# Patient Record
Sex: Male | Born: 1960 | Race: White | Hispanic: No | Marital: Married | Smoking: Never smoker
Health system: Southern US, Academic
[De-identification: ages and names within clinical notes are randomized; demographics above are authoritative.]

## PROBLEM LIST (undated history)

## (undated) ENCOUNTER — Encounter (HOSPITAL_COMMUNITY): Admission: RE | Payer: Self-pay | Source: Ambulatory Visit

## (undated) DIAGNOSIS — E785 Hyperlipidemia, unspecified: Secondary | ICD-10-CM

## (undated) DIAGNOSIS — E079 Disorder of thyroid, unspecified: Secondary | ICD-10-CM

## (undated) DIAGNOSIS — G40909 Epilepsy, unspecified, not intractable, without status epilepticus: Secondary | ICD-10-CM

## (undated) DIAGNOSIS — N400 Enlarged prostate without lower urinary tract symptoms: Secondary | ICD-10-CM

## (undated) DIAGNOSIS — Z952 Presence of prosthetic heart valve: Secondary | ICD-10-CM

## (undated) DIAGNOSIS — I10 Essential (primary) hypertension: Secondary | ICD-10-CM

## (undated) DIAGNOSIS — K5730 Diverticulosis of large intestine without perforation or abscess without bleeding: Secondary | ICD-10-CM

## (undated) DIAGNOSIS — N402 Nodular prostate without lower urinary tract symptoms: Secondary | ICD-10-CM

## (undated) DIAGNOSIS — M129 Arthropathy, unspecified: Secondary | ICD-10-CM

## (undated) DIAGNOSIS — F32A Depression, unspecified: Secondary | ICD-10-CM

## (undated) DIAGNOSIS — I82409 Acute embolism and thrombosis of unspecified deep veins of unspecified lower extremity: Secondary | ICD-10-CM

## (undated) DIAGNOSIS — Q631 Lobulated, fused and horseshoe kidney: Secondary | ICD-10-CM

## (undated) DIAGNOSIS — K219 Gastro-esophageal reflux disease without esophagitis: Secondary | ICD-10-CM

## (undated) DIAGNOSIS — I2699 Other pulmonary embolism without acute cor pulmonale: Secondary | ICD-10-CM

## (undated) DIAGNOSIS — Z973 Presence of spectacles and contact lenses: Secondary | ICD-10-CM

## (undated) DIAGNOSIS — F419 Anxiety disorder, unspecified: Secondary | ICD-10-CM

## (undated) DIAGNOSIS — E291 Testicular hypofunction: Secondary | ICD-10-CM

## (undated) DIAGNOSIS — A0472 Enterocolitis due to Clostridium difficile, not specified as recurrent: Secondary | ICD-10-CM

## (undated) DIAGNOSIS — K2289 Other specified disease of esophagus: Secondary | ICD-10-CM

## (undated) DIAGNOSIS — K228 Other specified diseases of esophagus: Secondary | ICD-10-CM

## (undated) DIAGNOSIS — E039 Hypothyroidism, unspecified: Secondary | ICD-10-CM

## (undated) DIAGNOSIS — F329 Major depressive disorder, single episode, unspecified: Secondary | ICD-10-CM

## (undated) DIAGNOSIS — M47812 Spondylosis without myelopathy or radiculopathy, cervical region: Secondary | ICD-10-CM

## (undated) DIAGNOSIS — K5790 Diverticulosis of intestine, part unspecified, without perforation or abscess without bleeding: Secondary | ICD-10-CM

## (undated) HISTORY — PX: INCISIONAL HERNIA REPAIR: SHX193

## (undated) HISTORY — DX: Gastro-esophageal reflux disease without esophagitis: K21.9

## (undated) HISTORY — PX: VENTRAL HERNIA REPAIR: SHX424

## (undated) HISTORY — DX: Presence of prosthetic heart valve: Z95.2

## (undated) HISTORY — DX: Enterocolitis due to Clostridium difficile, not specified as recurrent: A04.72

## (undated) HISTORY — DX: Anxiety disorder, unspecified: F41.9

## (undated) HISTORY — DX: Hyperlipidemia, unspecified: E78.5

## (undated) HISTORY — PX: HX TURP: SHX73

## (undated) HISTORY — DX: Nodular prostate without lower urinary tract symptoms: N40.2

## (undated) HISTORY — DX: Depression, unspecified: F32.A

## (undated) HISTORY — DX: Essential (primary) hypertension: I10

## (undated) HISTORY — DX: Testicular hypofunction: E29.1

## (undated) HISTORY — DX: Other pulmonary embolism without acute cor pulmonale: I26.99

## (undated) HISTORY — DX: Other pulmonary embolism without acute cor pulmonale (CMS HCC): I26.99

## (undated) HISTORY — DX: Other specified disease of esophagus: K22.89

## (undated) HISTORY — DX: Hypothyroidism, unspecified: E03.9

## (undated) HISTORY — PX: HX HERNIA REPAIR: SHX51

## (undated) HISTORY — DX: Acute embolism and thrombosis of unspecified deep veins of unspecified lower extremity (CMS HCC): I82.409

## (undated) HISTORY — DX: Diverticulosis of intestine, part unspecified, without perforation or abscess without bleeding: K57.90

## (undated) HISTORY — DX: Lobulated, fused and horseshoe kidney: Q63.1

## (undated) HISTORY — DX: Benign prostatic hyperplasia without lower urinary tract symptoms: N40.0

## (undated) HISTORY — DX: Epilepsy, unspecified, not intractable, without status epilepticus: G40.909

## (undated) HISTORY — DX: Acute embolism and thrombosis of unspecified deep veins of unspecified lower extremity: I82.409

## (undated) HISTORY — PX: COLONOSCOPY: WVUENDOPRO10

## (undated) HISTORY — DX: Epilepsy, unspecified, not intractable, without status epilepticus (CMS HCC): G40.909

## (undated) HISTORY — DX: Diverticulosis of large intestine without perforation or abscess without bleeding: K57.30

## (undated) HISTORY — DX: Disorder of thyroid, unspecified: E07.9

## (undated) HISTORY — PX: HERNIA REPAIR: SHX51

## (undated) HISTORY — PX: BOWEL RESECTION: SHX1257

## (undated) HISTORY — PX: CHOLECYSTECTOMY: SHX55

## (undated) HISTORY — PX: VALVE REPLACEMENT: SUR13

## (undated) SURGERY — RIGHT AND LEFT HEART CATH
Anesthesia: Moderate Sedation | Laterality: Bilateral

---
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
## 2003-05-02 ENCOUNTER — Encounter: Admission: RE | Admit: 2003-05-02 | Discharge: 2003-05-02 | Payer: Self-pay | Admitting: Gastroenterology

## 2003-10-20 ENCOUNTER — Encounter: Admission: RE | Admit: 2003-10-20 | Discharge: 2003-10-20 | Payer: Self-pay | Admitting: Gastroenterology

## 2003-11-06 ENCOUNTER — Encounter (INDEPENDENT_AMBULATORY_CARE_PROVIDER_SITE_OTHER): Payer: Self-pay | Admitting: *Deleted

## 2003-11-06 ENCOUNTER — Ambulatory Visit (HOSPITAL_COMMUNITY): Admission: RE | Admit: 2003-11-06 | Discharge: 2003-11-06 | Payer: Self-pay | Admitting: Gastroenterology

## 2003-11-27 ENCOUNTER — Encounter: Admission: RE | Admit: 2003-11-27 | Discharge: 2003-11-27 | Payer: Self-pay | Admitting: Gastroenterology

## 2004-01-23 ENCOUNTER — Observation Stay (HOSPITAL_COMMUNITY): Admission: RE | Admit: 2004-01-23 | Discharge: 2004-01-24 | Payer: Self-pay | Admitting: General Surgery

## 2004-03-29 ENCOUNTER — Encounter: Admission: RE | Admit: 2004-03-29 | Discharge: 2004-03-29 | Payer: Self-pay | Admitting: General Surgery

## 2004-11-24 ENCOUNTER — Encounter: Admission: RE | Admit: 2004-11-24 | Discharge: 2004-11-24 | Payer: Self-pay | Admitting: Family Medicine

## 2005-03-21 ENCOUNTER — Encounter: Admission: RE | Admit: 2005-03-21 | Discharge: 2005-03-21 | Payer: Self-pay | Admitting: Family Medicine

## 2005-10-31 ENCOUNTER — Encounter: Admission: RE | Admit: 2005-10-31 | Discharge: 2005-10-31 | Payer: Self-pay

## 2006-03-12 ENCOUNTER — Emergency Department (HOSPITAL_COMMUNITY): Admission: EM | Admit: 2006-03-12 | Discharge: 2006-03-12 | Payer: Self-pay | Admitting: Emergency Medicine

## 2006-06-29 IMAGING — RF DG UGI W/ SMALL BOWEL HIGH DENSITY
19 of 24 series · 19 of 24 positions shown · non-contrast
Comparison: none

CLINICAL DATA: Abdominal pain, nausea, vomiting.  
 HIGH DENSITY UPPER GI SERIES AND SMALL BOWEL FOLLOW THROUGH: 
 Comparison to CT abdomen and pelvis 05/02/03 [HOSPITAL].  
 The preliminary scout AP supine abdominal film demonstrates a normal bowel gas pattern.  No abnormal calcifications were identified.  
 The patient swallowed the thick and thin barium liquid without difficulty.  Esophageal peristalsis is normal.  No esophageal strictures or masses were identified.  A small sliding hiatal hernia is present with a wide open Schatzki?s ring.  Free gastroesophageal reflux did occur during the examination.  There is no radiographic evidence of esophagitis. 
 The stomach is otherwise normal in appearance and empties normally.  The duodenal bulb and duodenal sweep are unremarkable.  There is no evidence of active peptic ulcer disease. 
 Transit time through the small bowel was normal, with barium reaching the colon in approximately one hour and 15 minutes.  The mucosal pattern of the small bowel is normal throughout.  Fluoroscopic evaluation during manual compression revealed no intrinsic abnormalities of the small bowel.  The terminal ileum has a normal appearance.  
 Of note, the small bowel is displaced from the mid and lower abdomen due to the patient?s cross-fused renal ectopia noted on the prior CT examination.

[Series 1: run · 1 of 9 slices shown (1 of 19)]
[im 1/9]
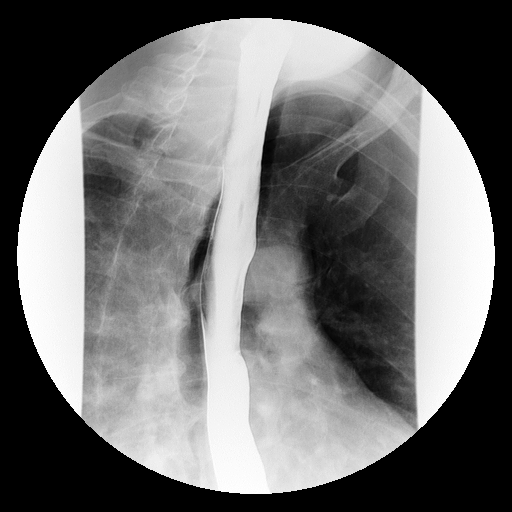

[Series 2: run · 1 of 1 slices shown (2 of 19)]
[im 1/1]
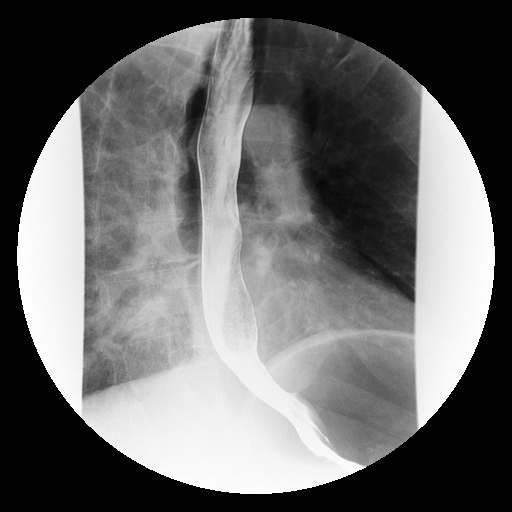

[Series 4: run · 1 of 1 slices shown (3 of 19)]
[im 1/1]
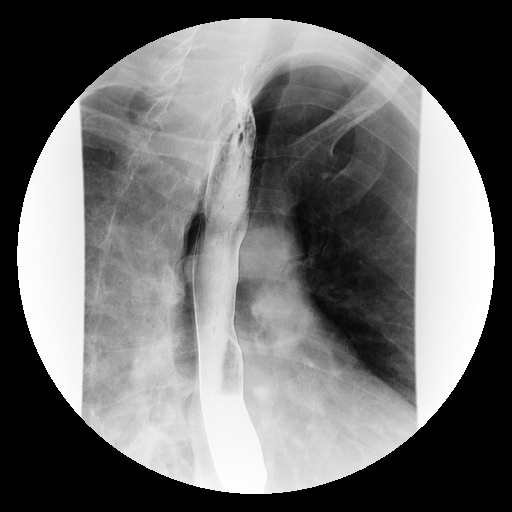

[Series 5: run · 1 of 1 slices shown (4 of 19)]
[im 1/1]
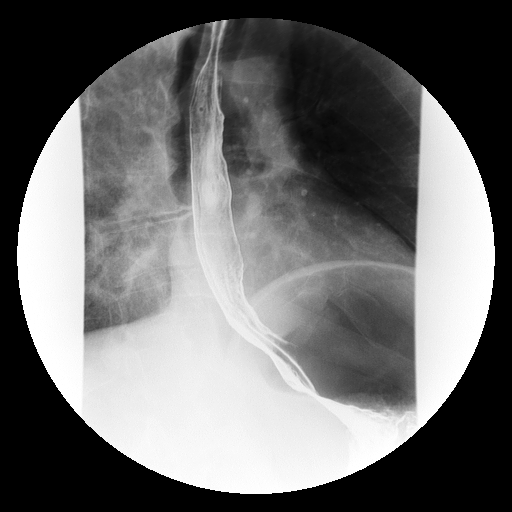

[Series 6: run · 1 of 1 slices shown (5 of 19)]
[im 1/1]
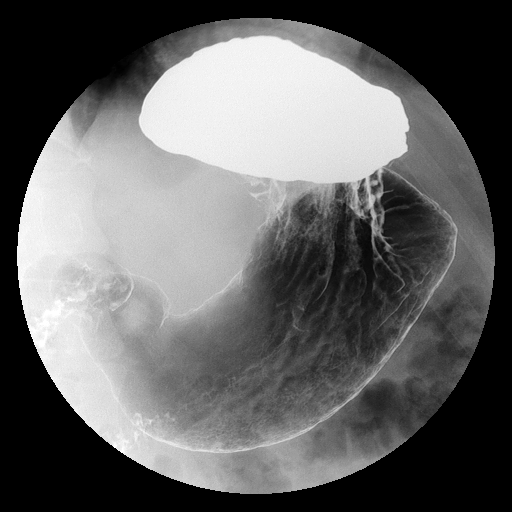

[Series 7: run · 1 of 1 slices shown (6 of 19)]
[im 1/1]
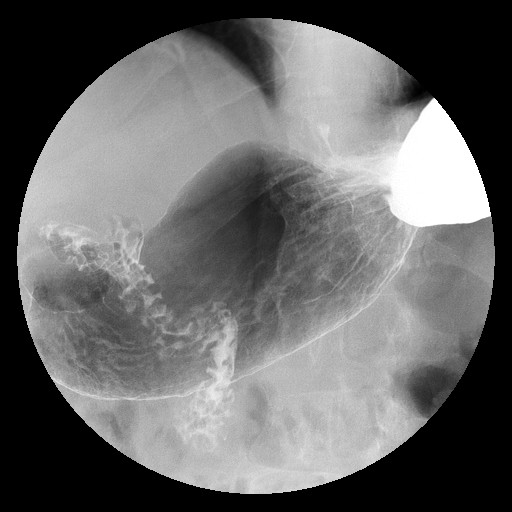

[Series 9: run · 1 of 1 slices shown (7 of 19)]
[im 1/1]
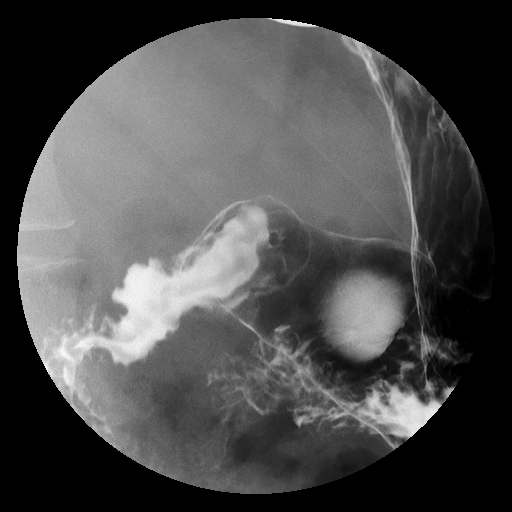

[Series 10: run · 1 of 1 slices shown (8 of 19)]
[im 1/1]
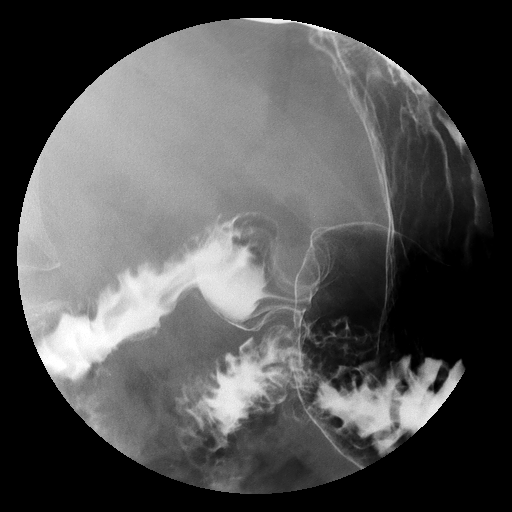

[Series 11: run · 1 of 1 slices shown (9 of 19)]
[im 1/1]
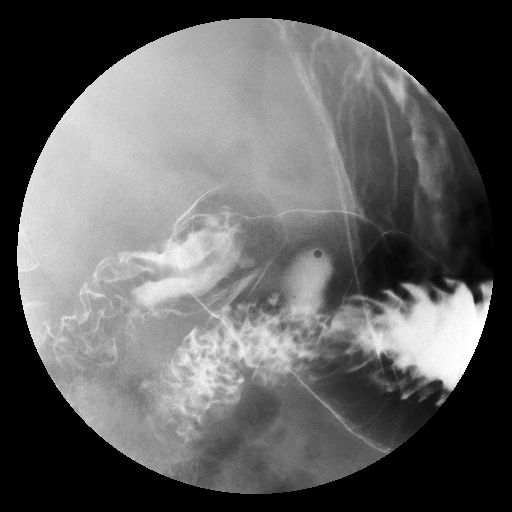

[Series 13: run · 1 of 1 slices shown (10 of 19)]
[im 1/1]
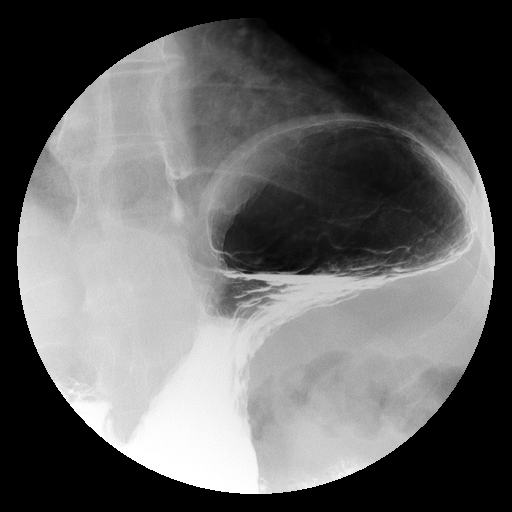

[Series 14: run · 1 of 14 slices shown (11 of 19)]
[im 1/14]
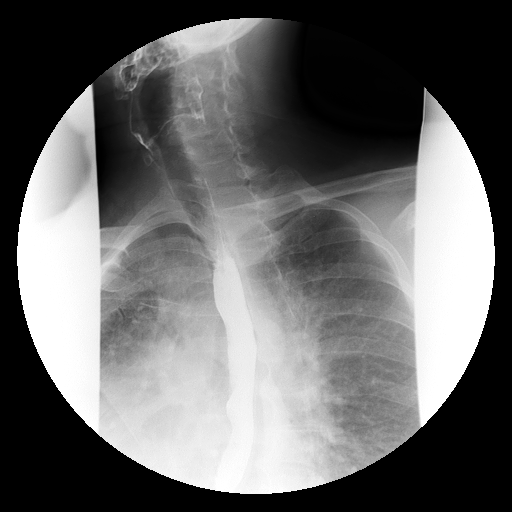

[Series 15: run · 1 of 17 slices shown (12 of 19)]
[im 1/17]
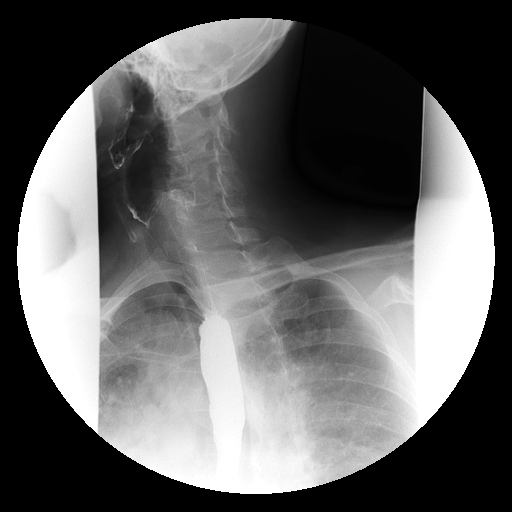

[Series 16: run · 1 of 1 slices shown (13 of 19)]
[im 1/1]
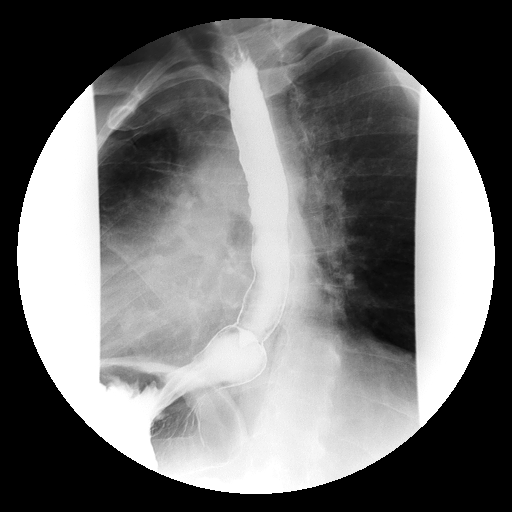

[Series 18: run · 1 of 1 slices shown (14 of 19)]
[im 1/1]
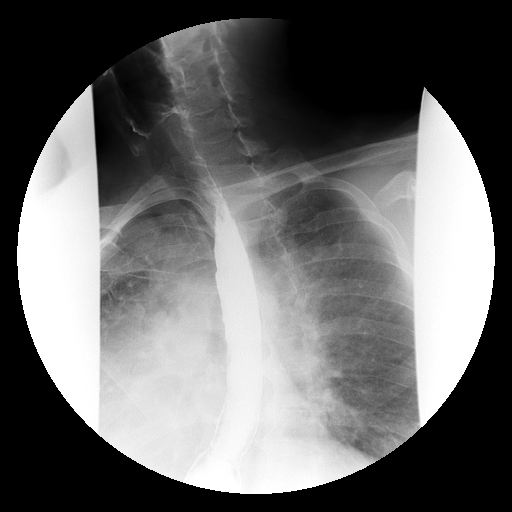

[Series 19: run · 1 of 1 slices shown (15 of 19)]
[im 1/1]
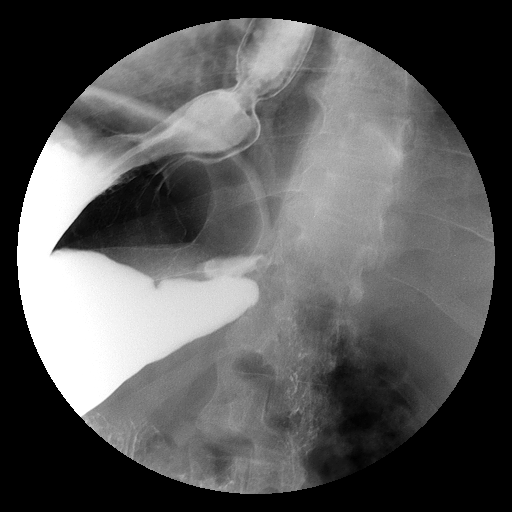

[Series 20: run · 1 of 1 slices shown (16 of 19)]
[im 1/1]
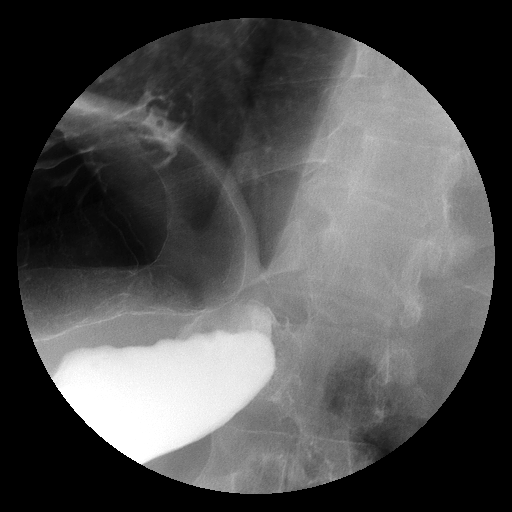

[Series 21: run · 1 of 1 slices shown (17 of 19)]
[im 1/1]
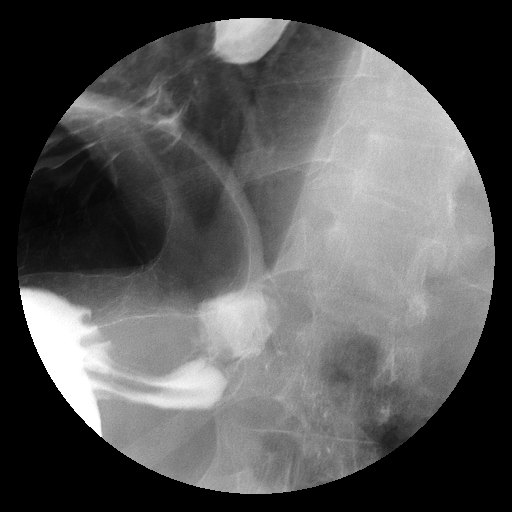

[Series 23: run · 1 of 1 slices shown (18 of 19)]
[im 1/1]
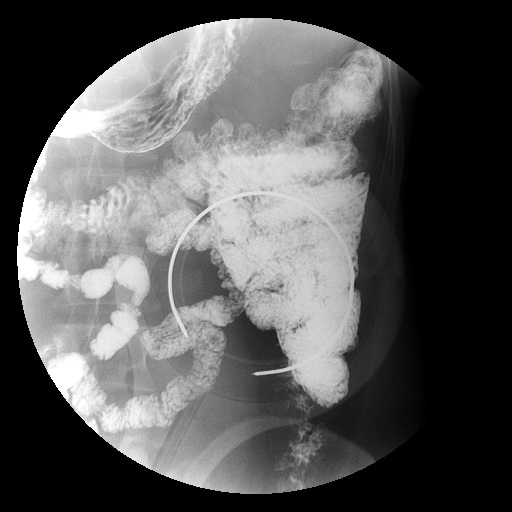

[Series 24: run · 1 of 1 slices shown (19 of 19)]
[im 1/1]
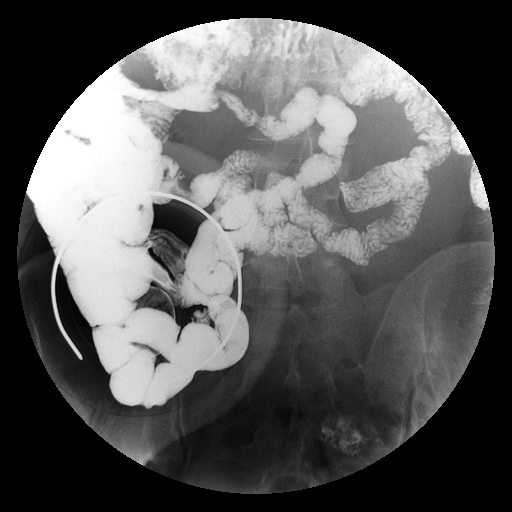

[19 of 24 positions shown; findings below may reference images not displayed]

IMPRESSION: 1.  Small sliding hiatal hernia with free gastroesophageal reflux.  No radiographic evidence of esophagitis.  No esophageal stricture. 
 2.  Normal upper GI series otherwise. 
 3.  Normal small bowel follow through.

## 2007-02-02 ENCOUNTER — Emergency Department (HOSPITAL_COMMUNITY): Admission: EM | Admit: 2007-02-02 | Discharge: 2007-02-02 | Payer: Self-pay | Admitting: Emergency Medicine

## 2007-03-08 HISTORY — PX: HX AORTIC VALVE REPLACEMENT: SHX41

## 2007-11-07 ENCOUNTER — Emergency Department (HOSPITAL_COMMUNITY): Admission: EM | Admit: 2007-11-07 | Discharge: 2007-11-07 | Payer: Self-pay | Admitting: Emergency Medicine

## 2008-03-28 ENCOUNTER — Encounter: Admission: RE | Admit: 2008-03-28 | Discharge: 2008-03-28 | Payer: Self-pay | Admitting: Gastroenterology

## 2008-08-21 ENCOUNTER — Ambulatory Visit (HOSPITAL_COMMUNITY): Admission: RE | Admit: 2008-08-21 | Discharge: 2008-08-21 | Payer: Self-pay | Admitting: Cardiology

## 2008-09-02 ENCOUNTER — Ambulatory Visit: Payer: Self-pay | Admitting: Thoracic Surgery (Cardiothoracic Vascular Surgery)

## 2008-12-19 ENCOUNTER — Encounter: Admission: RE | Admit: 2008-12-19 | Discharge: 2008-12-19 | Payer: Self-pay | Admitting: Family Medicine

## 2009-02-05 ENCOUNTER — Ambulatory Visit: Payer: Self-pay | Admitting: Thoracic Surgery (Cardiothoracic Vascular Surgery)

## 2009-02-13 ENCOUNTER — Inpatient Hospital Stay (HOSPITAL_BASED_OUTPATIENT_CLINIC_OR_DEPARTMENT_OTHER): Admission: RE | Admit: 2009-02-13 | Discharge: 2009-02-13 | Payer: Self-pay | Admitting: Cardiology

## 2009-03-09 ENCOUNTER — Encounter: Payer: Self-pay | Admitting: Thoracic Surgery (Cardiothoracic Vascular Surgery)

## 2009-03-09 ENCOUNTER — Ambulatory Visit (HOSPITAL_COMMUNITY)
Admission: RE | Admit: 2009-03-09 | Discharge: 2009-03-09 | Payer: Self-pay | Admitting: Thoracic Surgery (Cardiothoracic Vascular Surgery)

## 2009-03-09 ENCOUNTER — Ambulatory Visit: Payer: Self-pay | Admitting: Vascular Surgery

## 2009-03-20 ENCOUNTER — Ambulatory Visit: Payer: Self-pay | Admitting: Thoracic Surgery (Cardiothoracic Vascular Surgery)

## 2009-03-31 ENCOUNTER — Inpatient Hospital Stay (HOSPITAL_COMMUNITY)
Admission: RE | Admit: 2009-03-31 | Discharge: 2009-04-05 | Payer: Self-pay | Admitting: Thoracic Surgery (Cardiothoracic Vascular Surgery)

## 2009-03-31 ENCOUNTER — Ambulatory Visit: Payer: Self-pay | Admitting: Thoracic Surgery (Cardiothoracic Vascular Surgery)

## 2009-03-31 ENCOUNTER — Encounter: Payer: Self-pay | Admitting: Thoracic Surgery (Cardiothoracic Vascular Surgery)

## 2009-04-08 ENCOUNTER — Ambulatory Visit: Payer: Self-pay | Admitting: Thoracic Surgery (Cardiothoracic Vascular Surgery)

## 2009-04-20 ENCOUNTER — Encounter
Admission: RE | Admit: 2009-04-20 | Discharge: 2009-04-20 | Payer: Self-pay | Admitting: Thoracic Surgery (Cardiothoracic Vascular Surgery)

## 2009-04-20 ENCOUNTER — Ambulatory Visit: Payer: Self-pay | Admitting: Thoracic Surgery (Cardiothoracic Vascular Surgery)

## 2009-05-21 ENCOUNTER — Encounter (HOSPITAL_COMMUNITY): Admission: RE | Admit: 2009-05-21 | Discharge: 2009-07-06 | Payer: Self-pay | Admitting: Cardiology

## 2009-12-11 DIAGNOSIS — E785 Hyperlipidemia, unspecified: Secondary | ICD-10-CM | POA: Insufficient documentation

## 2009-12-11 DIAGNOSIS — E039 Hypothyroidism, unspecified: Secondary | ICD-10-CM | POA: Insufficient documentation

## 2009-12-11 DIAGNOSIS — J309 Allergic rhinitis, unspecified: Secondary | ICD-10-CM | POA: Insufficient documentation

## 2009-12-14 ENCOUNTER — Ambulatory Visit: Payer: Self-pay | Admitting: Internal Medicine

## 2009-12-14 DIAGNOSIS — R059 Cough, unspecified: Secondary | ICD-10-CM | POA: Insufficient documentation

## 2009-12-14 DIAGNOSIS — R0602 Shortness of breath: Secondary | ICD-10-CM | POA: Insufficient documentation

## 2009-12-14 DIAGNOSIS — R079 Chest pain, unspecified: Secondary | ICD-10-CM | POA: Insufficient documentation

## 2009-12-14 DIAGNOSIS — R05 Cough: Secondary | ICD-10-CM | POA: Insufficient documentation

## 2010-01-13 ENCOUNTER — Telehealth: Payer: Self-pay | Admitting: Internal Medicine

## 2010-01-19 ENCOUNTER — Telehealth: Payer: Self-pay | Admitting: Internal Medicine

## 2010-03-04 ENCOUNTER — Ambulatory Visit
Admission: RE | Admit: 2010-03-04 | Discharge: 2010-03-04 | Payer: Self-pay | Source: Home / Self Care | Attending: Pulmonary Disease | Admitting: Pulmonary Disease

## 2010-03-04 ENCOUNTER — Encounter: Payer: Self-pay | Admitting: Pulmonary Disease

## 2010-03-27 ENCOUNTER — Encounter: Payer: Self-pay | Admitting: Gastroenterology

## 2010-03-28 ENCOUNTER — Encounter: Payer: Self-pay | Admitting: Family Medicine

## 2010-04-05 ENCOUNTER — Ambulatory Visit: Admit: 2010-04-05 | Payer: Self-pay | Admitting: Pulmonary Disease

## 2010-04-06 NOTE — Assessment & Plan Note (Signed)
Summary: cough/cb   Visit Type:  Initial Consult Copy to:  Dr Donato Schultz - Cardiology Primary Maky[facility] Corprew/Referring Yanixan Mellinger:  Dr Johnn Hai - PMD, Dr Theora Master - Cards, Dr Dorris Fetch - CVTS  CC:  Pulmonary COnsult for SOB at night. Pt also c/o productive cough with clear and chest tightness x 1 year. Marland Kitchen  History of Present Illness: 50 year old male. Never smoker.   Cough. This was first symptom to develop. Developed after aortic valve replacement in Dec 2010-Jan 2011. No cough pre-surgery. Slowsly progressive. Moderate intensity. Dry cough mostly except first thing in morning there is some mucus. 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 Occ GERD +. He is on ACE inhibitor with amlodpine even before surgery.   Chest Pain/Tightness: Also developed post valve replacement. Located in infra-mammary area and mid-sternal area. Pain comes and goes. Getting more frequent recently. Severity is moderate-severe. Sharp pain. Only transiently relieved by motrin. Pain present even at rest. Denies associated radiation and he is unclear about aggravating factors.    Dyspnea: Present for past 6 months. Insidious onset. Brought on by exertional activities like walking. Relieved only partially by rest.   Denies associated wheeze, hemoptysis, edema, but does have occ. GERD   Current Medications (verified): 1)  Keppra 500 Mg Tabs (Levetiracetam) .... Take 1 Tablet By Mouth Two Times A Day 2)  Levoxyl 88 Mcg Tabs (Levothyroxine Sodium) .... Take 1 Tablet By Mouth Once A Day 3)  Multivitamins  Tabs (Multiple Vitamin) .... Take 1 Tablet By Mouth Once A Day 4)  Vitamin E 400 Unit Caps (Vitamin E) .... Take 1 Capsule By Mouth Once A Day 5)  Tylenol Extra Strength 500 Mg Tabs (Acetaminophen) .... As Needed 6)  Aspirin 81 Mg Tbec (Aspirin) .... Take 1 Tablet By Mouth Once A Day 7)  Fish Oil 1200 Mg Caps (Omega-3 Fatty Acids) .... 3 Caps in Am and I in Pm 8)  Calcium +  D + K 750-500-40 Mg-Unt-Mcg Tabs (Calcium-Vitamin D-Vitamin K) .... Take 1 Tablet By Mouth Once A Day 9)  Veramyst 27.5 Mcg/spray Susp (Fluticasone Furoate) .... 2 Puffs in Each Nostril Once A Day 10)  Fluticasone Propionate 50 Mcg/act Susp (Fluticasone Propionate) .... 2 Puffs in Each Nostril Once A Day 11)  Simvastatin 10 Mg Tabs (Simvastatin) .... Take 1 Tablet By Mouth Once A Day 12)  Hydrochlorothiazide 25 Mg Tabs (Hydrochlorothiazide) .... Take 1 Capsule By Mouth Once A Day 13)  Amlodipine Besy-Benazepril Hcl 5-40 Mg Caps (Amlodipine Besy-Benazepril Hcl) .... Take 1 Tablet By Mouth Once A Day  Allergies (verified): 1)  ! * Higer Dose Statin  Past History:  Past Medical History: Chest Pain Heart Murmur Hyperlipidemia Claudication Hypercholesteremia Bicuspid aortic valve - bovine. 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 Retta Diones Gouty Arthropathy Seizure disorder Hypothyroidism Allergic Rhinitis IBS  Family History: Father- died age 60 MI Mother-died age 61 unknown reasons  Social History: Patient never smoked.  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  The patient denies shortness of breath with activity, non-productive cough, coughing up blood, irregular heartbeats, acid heartburn, indigestion, loss of appetite, weight change, abdominal pain, difficulty swallowing, sore throat, tooth/dental problems, headaches, nasal congestion/difficulty breathing through  nose, sneezing, itching, ear ache, anxiety, depression, hand/feet swelling, joint stiffness or pain, rash, and fever.         fatigue  Vital Signs:  Patient profile:   50 year old male Height:      69 inches Weight:      203 pounds BMI:     30.09 O2 Sat:      99 % on  Room air Temp:     98.1 degrees F oral Pulse rate:   83 / minute BP sitting:   100 / 70  (right arm) Cuff size:   regular  Vitals Entered By: Carron Curie CMA (December 14, 2009 3:38 PM)  O2 Flow:  Room air CC: Pulmonary COnsult for SOB at night. Pt also c/o productive cough with clear and chest tightness x 1 year.  Comments Medications reviewed with patient Carron Curie CMA  December 14, 2009 3:43 PM Daytime phone number verified with patient.    Physical Exam  General:  well developed, well nourished, in no acute distressobese.   Head:  normocephalic and atraumatic Eyes:  PERRLA/EOM intact; conjunctiva and sclera clear Ears:  TMs intact and clear with normal canals Nose:  no deformity, discharge, inflammation, or lesions DNS to right + Mouth:  no deformity or lesions Neck:  no masses, thyromegaly, or abnormal cervical nodes Chest Wall:  no deformities noted scars from surgery + REPRODUCIBLE TENDERNESS + Lungs:  clear bilaterally to auscultation and percussion Heart:  regular rhythm, normal rate, no rubs, no gallops, and Grade 3 /6 DM loudest at LLSB.   Abdomen:  bowel sounds positive; abdomen soft and non-tender without masses, or organomegaly Msk:  no deformity or scoliosis noted with normal posture Pulses:  pulses normal Extremities:  no clubbing, cyanosis, edema, or deformity noted Neurologic:  CN II-XII grossly intact with normal reflexes, coordination, muscle strength and tone Skin:  intact without lesions or rashes Cervical Nodes:  no significant adenopathy Axillary Nodes:  no significant adenopathy Psych:  alert and cooperative; normal mood and affect; normal attention span and concentration   CXR  Procedure date:  04/20/2009  Findings:       Clinical Data: status post aortic valve replacement    CHEST - 2 VIEW    Comparison: 04/03/2009    Findings: A side plate screw device is identified within the mid   sternum.    Heart size is normal.     Pleural effusions have resolved in the interval.    There is scarring identified within the lung bases.    Mild thoracic spondylosis is noted.    IMPRESSION:    1.  Interval resolution of pleural effusions.    Read By:  Rosealee Albee,  M.D.  Comments:      independently reviewed  Impression & Recommendations:  Problem # 1:  COUGH (ICD-786.2) Assessment New Multifactorial. Sinus drainage, Occ GERD and ACE inhibitors are playing leading roles  plan dc benazepril increase amlodipine to 10mg  daily use netti pot saline wash daily for sinus drainage address GERD at fu Orders: Pulmonary Referral (Pulmonary) Consultation Level V (30865)  Problem # 2:  SHORTNESS OF BREATH (SOB) (ICD-786.05) Assessment: New  unclear cause  plan get full pft  Orders: Consultation Level V (78469)  Problem # 3:  CHEST PAIN-UNSPECIFIED (ICD-786.50) Assessment: New  Sounds musculoskeletal. Has reproducible tendernss   plan reassess at fu  Orders: Consultation Level V (62952)  Medications Added to Medication List This Visit: 1)  Amlodipine Besylate 10 Mg Tabs (  Amlodipine besylate) .... One tablet by mouth daily  Patient Instructions: 1)  stop benazepril part of your bp medication 2)  continue amlodipine but we will increase it from 5mg  to 10mg  daily 3)  stop fish oil as well 4)  use netti pot saline wash daily 5)   - get information booklet from my nurse 6)  hvae full PFT in 1 month 7)  return to see me in 1 month Prescriptions: AMLODIPINE BESYLATE 10 MG  TABS (AMLODIPINE BESYLATE) One tablet by mouth daily  #30 x 1   Entered and Authorized by:   Kalman Shan MD   Signed by:   Kalman Shan MD on 12/14/2009   Method used:   Print then Give to Patient   RxID:   [id number]     Immunization History:  Influenza Immunization History:    Influenza:  fluvax 3+ (12/07/2009)  Pneumovax Immunization History:    Pneumovax:  pneumovax (03/16/2009)

## 2010-04-06 NOTE — Progress Notes (Signed)
Summary: nos appt  Phone Note Call from Patient   Caller: [email or web address]  Call For: ramaswamy Summary of Call: In ref to nos from 11/8, pt states he's having surgery on 12/2, and he won't be rescheduling appt. Initial call taken by: Darletta Moll,  January 13, 2010 10:15 AM     Appended Document: nos appt what surgery ? does he want to see [place] after surgery ?

## 2010-04-06 NOTE — Progress Notes (Signed)
Summary: nos appt  Phone Note Call from Patient   Caller: [email or web address]  Call For: ramaswamy Summary of Call: Pt states that's he's having surgery, and he won't need a pulmonary appt at all. Initial call taken by: Darletta Moll,  January 19, 2010 8:10 AM

## 2010-04-08 NOTE — Assessment & Plan Note (Signed)
Summary: cough/ramaswamy pt/cant handle it anymore/cb   Visit Type:  acute visit Copy to:  Dr Donato Schultz, Dr. Maryclare Labrador Primary [facility]/Referring Koleman Marling:  Dr Johnn Hai  CC:  Acute visit...MR patient...patient c/o prod and non-prod cough off and on since Jan 2011.Marland Kitchen  History of Present Illness: 50 yo never smoker with cough.  He continues to have trouble with his cough.  He was seen by Dr. Maryclare Labrador in [facility].  Dr. Tenny Craw specializes in ENT and allergies.  Mr. Hollyfield had CT sinus with Dr. Tenny Craw, and was advised that he needs repair of nasal septal defect.  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  He has put off surgery so far to determine what insurance coverage he has for this.  His cough is productive of milky white sputum.  It used to be yellow and black before he took a recent course of antibiotics.  He does get a globus sensation.  He denies fever.  He did not notice much help from flonase.  He has not been using veramyst.  He takes claritin on a regular basis, and this helps.  He will occasional get tightness in his chest.  He has not noticed wheezing.  His cough gets worse at night, and this can cause trouble with his sleep.  He does get frequent episodes of reflux, but is not taking any medication for this.  He was not able to spirometry technique effectively today.  CXR  Procedure date:  03/04/2010  Findings:      CHEST - 2 VIEW   Comparison: 04/20/2009.   Findings: The heart size and mediastinal contours are stable status post mini-sternotomy and aortic valve replacement.  The lungs are clear.  There is no pleural effusion or pneumothorax.  Thoracic spine degenerative changes and scoliosis are unchanged.   IMPRESSION: Stable examination.  No active cardiopulmonary process.   Current Medications (verified): 1)  Simvastatin 10 Mg Tabs (Simvastatin) .... Take 1 Tablet By Mouth Once A Day 2)  Hydrochlorothiazide 25 Mg Tabs (Hydrochlorothiazide) .... Take 1  Capsule By Mouth Once A Day 3)  Aspirin 81 Mg Tbec (Aspirin) .... Take 1 Tablet By Mouth Once A Day 4)  Keppra 500 Mg Tabs (Levetiracetam) .... Take 1 Tablet By Mouth Two Times A Day 5)  Levoxyl 88 Mcg Tabs (Levothyroxine Sodium) .... Take 1 Tablet By Mouth Once A Day 6)  Veramyst 27.5 Mcg/spray Susp (Fluticasone Furoate) .... Out 7)  Fluticasone Propionate 50 Mcg/act Susp (Fluticasone Propionate) .... Out 8)  Multivitamins  Tabs (Multiple Vitamin) .... Take 1 Tablet By Mouth Once A Day 9)  Vitamin E 400 Unit Caps (Vitamin E) .... Take 1 Capsule By Mouth Once A Day 10)  Tylenol Extra Strength 500 Mg Tabs (Acetaminophen) .... As Needed  Allergies (verified): 1)  ! * Higer Dose Statin  Past History:  Past Medical History: Bicuspid aortic vavle s/p bovine replacement Jan 2011      - no coumadin due to hx of epilepsy Hypertension Hyperlipidemia Claudication IBS Colon Polyps Hemorrhoids horseshoe kidney with complex cyst, CKD Stage III-Dr. Retta Diones Gouty Arthropathy ANA positive Neuropathy Seizure disorder Hypothyroidism Allergic Rhinitis  Past Surgical History: Skin lesion biopsy 2004 Colonoscopy 2005 Right inguinal hernia repair 2005 Aortic vavle replacement 2011  Family History: Reviewed history from 12/14/2009 and no changes required. Father- died age 24 MI Mother-died age 40 unknown reasons  Social History: Reviewed history from 12/14/2009 and no changes required. Patient never smoked.  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 Comments Medications reviewed with patient Michel Bickers [facility]  March 04, 2010 3:17 PM   Physical Exam  General:  normal appearance and healthy appearing.   Eyes:  PERRLA and EOMI.   Ears:  mild cerumen build up b/l Nose:  septal deviation to right, clear drainage, no tenderness Mouth:  mild erythema posterior pharynx, no exudate Neck:  no JVD.   Lungs:  clear bilaterally to auscultation and percussion Heart:  regular rhythm, normal rate, no rubs, no gallops, and Grade 3 /6 DM loudest at LLSB.   Extremities:  no clubbing, cyanosis, edema, or deformity noted Neurologic:  normal CN II-XII and strength normal.   Cervical Nodes:  no significant adenopathy Psych:  alert and cooperative; normal mood and affect; normal attention span and concentration   Impression & Recommendations:  Problem # 1:  COUGH (ICD-786.2) Likely multifactorial: Sinus disease with post-nasal drip, GERD, and asthma.  Will give him a trial of singulair to see if this improves his sinuses and asthma symptoms.  Advised him that he would likely also have some problem due to mechanical obstruction from deviated nasal septum.  As such surgical intervention is likely to be his best option.  Will also give him a trial of omeprazole for possible reflux.  Depending on his response he may need further GI evaluation.  He has see Dr. Ewing Schlein previously.  Medications Added to Medication List This Visit: 1)  Fluticasone Propionate 50 Mcg/act Susp (Fluticasone propionate) .... Out 2)  Singulair 10 Mg Tabs (Montelukast sodium) .... One by mouth at bedtime 3)  Veramyst 27.5 Mcg/spray Susp (Fluticasone furoate) .... Out 4)  Omeprazole 40 Mg Cpdr (Omeprazole) .... One by mouth once daily 30 minutes before first meal of the day  Complete Medication List: 1)  Fluticasone Propionate 50 Mcg/act Susp (Fluticasone propionate) ....  Out 2)  Singulair 10 Mg Tabs (Montelukast sodium) .... One by mouth at bedtime 3)  Simvastatin 10 Mg Tabs (Simvastatin) .... Take 1 tablet by mouth once a day 4)  Hydrochlorothiazide 25 Mg Tabs (Hydrochlorothiazide) .... Take 1 capsule by mouth once a day 5)  Aspirin 81 Mg Tbec (Aspirin) .... Take 1 tablet by mouth once a day 6)  Keppra 500 Mg Tabs (Levetiracetam) .... Take 1 tablet by mouth two times a day 7)  Levoxyl 88 Mcg Tabs (Levothyroxine sodium) .... Take 1 tablet by mouth once a day 8)  Omeprazole 40 Mg Cpdr (Omeprazole) .... One by mouth once daily 30 minutes before first meal of the day 9)  Multivitamins Tabs (Multiple vitamin) .... Take 1 tablet by mouth once a day 10)  Vitamin E 400 Unit Caps (Vitamin e) .... Take 1 capsule by mouth once a day 11)  Tylenol Extra  Strength 500 Mg Tabs (Acetaminophen) .... As needed  Other Orders: Spirometry w/Graph (94010) Est. Patient Level IV (99214) T-2 View CXR (71020TC)  Patient Instructions: 1)  Singulair 10 mg at bedtime 2)  Omeprazole 40 mg once daily, 30 minutes before first meal of day 3)  Follow up in 4 weeks Prescriptions: OMEPRAZOLE 40 MG CPDR (OMEPRAZOLE) one by mouth once daily 30 minutes before first meal of the day  #30 x 3   Entered and Authorized by:   Coralyn Helling MD   Signed by:   Coralyn Helling MD on 03/04/2010   Method used:   Electronically to        [facility] [phone]* (retail)       [address]       [address]       [place], [place]  [address]       Ph: [phone]       Fax: [phone]   RxID:   [id number] SINGULAIR 10 MG TABS (MONTELUKAST SODIUM) one by mouth at bedtime  #30 x 6   Entered and Authorized by:   Coralyn Helling MD   Signed by:   Coralyn Helling MD on 03/04/2010   Method used:   Electronically to        [facility] [phone]* (retail)       [address]       [address]       [place], [place]  [address]       Ph: [phone]       Fax: [phone]   RxID:    [id number]

## 2010-05-23 LAB — COMPREHENSIVE METABOLIC PANEL
ALT: 22 U/L (ref 0–53)
AST: 25 U/L (ref 0–37)
Albumin: 4.1 g/dL (ref 3.5–5.2)
Alkaline Phosphatase: 99 U/L (ref 39–117)
CO2: 23 mEq/L (ref 19–32)
Calcium: 9.3 mg/dL (ref 8.4–10.5)
Chloride: 103 mEq/L (ref 96–112)
Creatinine, Ser: 1.01 mg/dL (ref 0.4–1.5)
GFR calc Af Amer: >60 mL/min (ref >60)
GFR calc non Af Amer: >60 mL/min (ref >60)
GFR calc non Af Amer: >60 mL/min (ref >60)
Glucose, Bld: 148 mg/dL — ABNORMAL HIGH (ref 70–99)
Potassium: 4 mEq/L (ref 3.5–5.1)
Sodium: 135 mEq/L (ref 135–145)
Total Bilirubin: 0.9 mg/dL (ref 0.3–1.2)
Total Protein: 7.6 g/dL (ref 6.0–8.3)

## 2010-05-23 LAB — POCT I-STAT 4, (NA,K, GLUC, HGB,HCT)
Glucose, Bld: 108 mg/dL — ABNORMAL HIGH (ref 70–99)
Glucose, Bld: 143 mg/dL — ABNORMAL HIGH (ref 70–99)
Glucose, Bld: 94 mg/dL (ref 70–99)
HCT: 32 % — ABNORMAL LOW (ref 39.0–52.0)
HCT: 33 % — ABNORMAL LOW (ref 39.0–52.0)
HCT: 33 % — ABNORMAL LOW (ref 39.0–52.0)
Hemoglobin: 11.2 g/dL — ABNORMAL LOW (ref 13.0–17.0)
Hemoglobin: 13.6 g/dL (ref 13.0–17.0)
Potassium: 4 mEq/L (ref 3.5–5.1)
Potassium: 4.1 mEq/L (ref 3.5–5.1)
Potassium: 4.4 mEq/L (ref 3.5–5.1)
Sodium: 135 mEq/L (ref 135–145)
Sodium: 139 mEq/L (ref 135–145)

## 2010-05-23 LAB — CBC
Hemoglobin: 16.1 g/dL (ref 13.0–17.0)
Hemoglobin: 10.4 g/dL — ABNORMAL LOW (ref 13.0–17.0)
MCHC: 34.1 g/dL (ref 30.0–36.0)
MCHC: 34.9 g/dL (ref 30.0–36.0)
MCHC: 35 g/dL (ref 30.0–36.0)
MCV: 95 fL (ref 78.0–100.0)
MCV: 95.6 fL (ref 78.0–100.0)
Platelets: 109 10*3/uL — ABNORMAL LOW (ref 150–400)
Platelets: 120 10*3/uL — ABNORMAL LOW (ref 150–400)
Platelets: 195 10*3/uL (ref 150–400)
RBC: 4.95 MIL/uL (ref 4.22–5.81)
RBC: 3.11 MIL/uL — ABNORMAL LOW (ref 4.22–5.81)
RBC: 3.54 MIL/uL — ABNORMAL LOW (ref 4.22–5.81)
RDW: 13.9 % (ref 11.5–15.5)
RDW: 14.5 % (ref 11.5–15.5)
RDW: 14.9 % (ref 11.5–15.5)
WBC: 11 10*3/uL — ABNORMAL HIGH (ref 4.0–10.5)
WBC: 15.1 10*3/uL — ABNORMAL HIGH (ref 4.0–10.5)

## 2010-05-23 LAB — BLOOD GAS, ARTERIAL
Acid-Base Excess: 0.2 mmol/L (ref 0.0–2.0)
Drawn by: 206361
Drawn by: 206361
FIO2: 0.21 %
Patient temperature: 98.6
pCO2 arterial: 37.4 mmHg (ref 35.0–45.0)
pCO2 arterial: 35.6 mmHg (ref 35.0–45.0)
pH, Arterial: 7.442 (ref 7.350–7.450)
pO2, Arterial: 135 mmHg — ABNORMAL HIGH (ref 80.0–100.0)

## 2010-05-23 LAB — GLUCOSE, CAPILLARY
Glucose-Capillary: 107 mg/dL — ABNORMAL HIGH (ref 70–99)
Glucose-Capillary: 110 mg/dL — ABNORMAL HIGH (ref 70–99)
Glucose-Capillary: 112 mg/dL — ABNORMAL HIGH (ref 70–99)
Glucose-Capillary: 135 mg/dL — ABNORMAL HIGH (ref 70–99)
Glucose-Capillary: 86 mg/dL (ref 70–99)

## 2010-05-23 LAB — URINALYSIS, ROUTINE W REFLEX MICROSCOPIC
Bilirubin Urine: NEGATIVE
Glucose, UA: NEGATIVE mg/dL
Hgb urine dipstick: NEGATIVE
Nitrite: NEGATIVE
Specific Gravity, Urine: 1.015 (ref 1.005–1.030)
pH: 6 (ref 5.0–8.0)

## 2010-05-23 LAB — HEMOGLOBIN A1C
Hgb A1c MFr Bld: 5.3 % (ref 4.6–6.1)
Hgb A1c MFr Bld: 5.5 % (ref 4.6–6.1)

## 2010-05-23 LAB — POCT I-STAT, CHEM 8
BUN: 12 mg/dL (ref 6–23)
Creatinine, Ser: 1.1 mg/dL (ref 0.4–1.5)
Glucose, Bld: 151 mg/dL — ABNORMAL HIGH (ref 70–99)
Sodium: 136 mEq/L (ref 135–145)
TCO2: 21 mmol/L (ref 0–100)

## 2010-05-23 LAB — POCT I-STAT 3, ART BLOOD GAS (G3+)
Acid-base deficit: 1 mmol/L (ref 0.0–2.0)
Acid-base deficit: 3 mmol/L — ABNORMAL HIGH (ref 0.0–2.0)
Acid-base deficit: 3 mmol/L — ABNORMAL HIGH (ref 0.0–2.0)
Bicarbonate: 22.7 mEq/L (ref 20.0–24.0)
Bicarbonate: 23.7 mEq/L (ref 20.0–24.0)
Bicarbonate: 27 mEq/L — ABNORMAL HIGH (ref 20.0–24.0)
Patient temperature: 35.7
Patient temperature: 36.7
TCO2: 24 mmol/L (ref 0–100)
TCO2: 25 mmol/L (ref 0–100)
pH, Arterial: 7.391 (ref 7.350–7.450)
pH, Arterial: 7.419 (ref 7.350–7.450)

## 2010-05-23 LAB — BASIC METABOLIC PANEL
BUN: 11 mg/dL (ref 6–23)
CO2: 25 mEq/L (ref 19–32)
Calcium: 7.9 mg/dL — ABNORMAL LOW (ref 8.4–10.5)
Calcium: 8.5 mg/dL (ref 8.4–10.5)
GFR calc Af Amer: >60 mL/min (ref >60)
GFR calc non Af Amer: >60 mL/min (ref >60)
GFR calc non Af Amer: >60 mL/min (ref >60)
Glucose, Bld: 116 mg/dL — ABNORMAL HIGH (ref 70–99)
Sodium: 136 mEq/L (ref 135–145)

## 2010-05-23 LAB — PROTIME-INR
INR: 1.39 (ref 0.00–1.49)
Prothrombin Time: 16.9 seconds — ABNORMAL HIGH (ref 11.6–15.2)

## 2010-05-23 LAB — HEMOGLOBIN AND HEMATOCRIT, BLOOD: HCT: 31 % — ABNORMAL LOW (ref 39.0–52.0)

## 2010-05-23 LAB — CREATININE, SERUM
Creatinine, Ser: 1.15 mg/dL (ref 0.4–1.5)
GFR calc Af Amer: >60 mL/min (ref >60)

## 2010-05-23 LAB — TYPE AND SCREEN: Antibody Screen: NEGATIVE

## 2010-05-23 LAB — APTT: aPTT: 29 seconds (ref 24–37)

## 2010-05-23 LAB — ABO/RH: ABO/RH(D): O POS
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
## 2010-07-20 NOTE — Assessment & Plan Note (Signed)
OFFICE VISIT   Brett Byrd, Brett Byrd  DOB:  04-14-1960                                        March 20, 2009  CHART #:  [id number]   HISTORY OF PRESENT ILLNESS:  The patient comes in today.  He had been  scheduled for aortic valve replacement last week; however, he called in  and said that he had a fever of 101.  He was having some dental pain  issues and also had sinus congestion and pain.  His surgery was  cancelled.  He saw his physician and was given a prescription for a Z-  Pak.  He also saw his dentist and was told that the pain was just due to  the filling over top of nerve and that no additional dental procedures  are necessary.  He states that since he started antibiotics his  temperatures have come down.  His sinus congestion has improved and  currently he feels well.   PHYSICAL EXAMINATION:  The patient is a 50 year old gentleman in no  acute distress.  His blood pressure is 120/70, pulse 113, respirations  are 18, his ox saturation is 97% on room air.  Cardiac exam has a  diastolic murmur.  Lungs are clear with equal breath sounds.   LABORATORY DATA:  CBC today had a white count of 16.2, hematocrit is 48,  platelets 266.   IMPRESSION:  The patient is a 50 year old gentleman who is scheduled for  aortic valve replacement, that had to be cancelled due to fever likely  secondary to sinusitis.  His dental situation has been straightened out.  There is no further dental work that needs to be done.  He is about to  complete his course of antibiotics.  I would like to give him a few days  off antibiotics to  make sure his fever completely resolves before surgery.  We will plan to  have him come in for surgery on Tuesday, March 31, 2009.   Salvatore Decent Dorris Fetch, M.D.  Electronically Signed   SCH/MEDQ  D:  03/20/2009  T:  03/21/2009  Job:  [id number]   cc:   Jake Bathe, MD  Emeterio Reeve, MD

## 2010-07-20 NOTE — H&P (Signed)
HISTORY AND PHYSICAL EXAMINATION   February 05, 2009   Re:  Brett, Byrd T       DOB:  Dec 17, 1960   CHIEF COMPLAINT:  Shortness of breath with exertion.   HISTORY OF PRESENT ILLNESS:  The patient is a 50 year old gentleman with  a history of hypertension, hypercholesterolemia, and epilepsy.  He has  also had a heart murmur most of his life and he has been followed by Dr.  Anne Fu with a bicuspid aortic valve and overtime has developed severe  aortic regurgitation.  He was seen in June.  At that time, he was having  two-pillow orthopnea and more notable exertional shortness of breath and  also complained of lack of energy.  He had preserved left ventricular  systolic function and was advised at that time to undergo surgery.  I  saw the patient at that time, however, because of some health problems  with his wife and other reasons he did not want to proceed with surgery.  He now returns having even more shortness of breath with exertion and  wishes to proceed in the near future.   PAST MEDICAL HISTORY:  Significant for bicuspid aortic valve, severe  aortic insufficiency, epilepsy, arthritis, polyneuropathy,  diverticulosis, chronic constipation, hemorrhoids, hypothyroidism,  hypertension, hypercholesterolemia, and gout.   CURRENT MEDICATIONS:  1. Keppra 500 mg p.o. b.i.d.  2. Levoxyl 75 mcg p.o. daily.  3. Lotrel 5/40 one tablet p.o. daily.  4. Simvastatin 10 mg at bedtime.  5. Aspirin 81 mg daily.  6. Vitamin B12.  7. Fexofenadine 180 mg daily p.r.n.  8. Fluticasone 27.5 mcg 2 puffs each nostril p.r.n.   ALLERGIES:  He has no known drug allergies.   FAMILY HISTORY:  Significant for father dying of MI at age 64.   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  He is  married.  He lives with his wife.  He does have health problems.  He  does not smoke or drink.   REVIEW OF SYSTEMS:  No recent seizures.  He does have two-pillow  orthopnea,  constipation, reflux, arthritis, and chest wall pain.  All  other systems are negative.   PHYSICAL EXAMINATION:  General:  The patient is a 50 year old gentleman  in no acute distress.  Neurologic:  He is alert and oriented x3 with no  focal deficits.  HEENT:  Poor dentition.  Neck:  Supple without  thyromegaly or JVD.  There are no bruits.  Cardiac:  Diastolic murmur  heard best at the right lower sternal border.  He has a regular rate and  rhythm.  Lungs:  Clear with equal breath sounds bilaterally.  Abdomen:  Soft, nontender.  Extremities:  Without clubbing, cyanosis.  He does  have 1+ edema in both lower extremities.  He has palpable pulses  throughout.   DIAGNOSTIC TESTS:  Echocardiogram was reviewed.   IMPRESSION:  The patient is a 50 year old gentleman with severe aortic  insufficiency who is having progressive symptoms.  He has been advised  previously to have aortic valve replacement but did not because of  social issues.  He now currently does wish to proceed with surgery.  I  will discuss with Dr. Anne Fu.  I do feel that we probably should do a  cardiac catheterization.  He had a negative stress test, but  particularly as the patient is considering a minimally invasive approach  and also given his strong family history with mother and father both  dying  around age 43 presumably with heart disease, then I think it would  be reasonable to do a cardiac catheterization ahead of time.  I will  speak with Dr. Anne Fu and try to arrange that.   Regarding valve choice, we discussed this at length at his last visit.  Given his history of seizures some of which have been quite significant  including some head trauma even though he has not had one the past 2  years, he I think always had some risk of that recurring and I think he  would be best served with a tissue valve to avoid the need for lifelong  anticoagulation.  He is in agreement with that approach.  He and his  wife both  understand this tissue valve could ultimately fail at an  uncertain time and would require re-replacement.   The next issue regards the operative approach.  I discussed the relative  advantages and disadvantages of sternotomy versus partial sternotomy  versus an anterior mini thoracotomy approach.  He is currently uncertain  as to which incision approach he would prefer.   I did discuss with them the indications, risks, benefits, and  alternatives.  They understand the risks include but not limited to  death, MI, DVT, PE, bleeding, possible need for transfusions,  infections, complete heart block requiring pacemaker placement, as well  as other organ system dysfunction including respiratory, renal, or GI  complications.  He understands and accepts these risks and agrees to  proceed and currently wishes to proceed in early January after the  holidays.  We will try to arrange with Dr. Anne Fu to see we could may be  do a cardiac catheterization and then we could admit the patient and do  surgery the following day.   Salvatore Decent Dorris Fetch, M.D.  Electronically Signed   SCH/MEDQ  D:  02/05/2009  T:  02/05/2009  Job:  [id number]   cc:   Jake Bathe, MD  Emeterio Reeve, MD

## 2010-07-20 NOTE — Assessment & Plan Note (Signed)
OFFICE VISIT   COLBEY, WIRTANEN  DOB:  October 10, 1960                                        April 20, 2009  CHART #:  [id number]   REASON FOR VISIT:  Follow up after recent aortic valve replacement.   HISTORY OF PRESENT ILLNESS:  The patient is a 50 year old gentleman who  had severe aortic insufficiency, underwent aortic valve replacement via  right mini thoracotomy on March 31, 2009.  He was discharged home on  postoperative day #5.  Since then, he has done well.  He is having  minimal discomfort.  He is not having to take any pain medication.  He  did complain of some dizziness.  He was out taking a walk couple of days  ago and he said he started feeling a little off balance and dizzy, and  even when he sat down he felt dizzy for a while after that.  He did have  any more yesterday and he has not had any of that today.  He saw Dr.  Anne Fu last week.  He was started on a multivitamin thinking that he was  somewhat anemic.  He also complains of foods taste pretty bland.  He is  on no added salt diet.   CURRENT MEDICATIONS:  1. Levoxyl 75 mcg daily.  2. Vitamin B12 daily.  3. Aspirin 81 mg daily.  4. Simvastatin 10 mg at bedtime.  5. Lotrel 5/40 one tablet daily.  6. Hydrochlorothiazide 12.5 mg daily.  7. Fish oil 1000 mg daily.  8. Metoprolol 25 mg b.i.d.  9. Levetiracetam 500 mg b.i.d.   ALLERGIES:  He has no known drug allergies.   PHYSICAL EXAMINATION:  The patient is a 50 year old gentleman in no  acute distress.  His blood pressure is 132/79, pulse 100, respirations  are 18, his oxygen saturation is 100% on room air.  Neurologically, he  is alert and oriented x3 with no focal deficits.  His lungs are clear  with equal breath sounds bilaterally.  His wound is healing well with no  erythema or induration.  Cardiac exam has regular rate and rhythm.  Normal S1 and S2.  No rubs, murmurs, or gallops.   DIAGNOSTIC TESTS:  Chest x-ray shows  postoperative changes.  There are  no effusions or stress.   IMPRESSION:  The patient is a 50 year old gentleman status post aortic  valve replacement through mini thoracotomy.  He is doing well at this  point in time.  He has had this episode of dizziness and obviously I  cautioned him that if he were to have that recurred to give [place] a call or  Dr. Anne Fu to call and get reevaluated.  I still do not want him lifting  any heavy objects for another 3 weeks.  I advised him to wait about a  week before starting cardiac rehab or trying to drive a car just make  sure that this dizziness does not recur.  He will continue to be  followed by Dr. Anne Fu and Dr. Paulino Rily.  I would be happy to see him  back at anytime if I can be of any further assistance with his care.   Salvatore Decent Dorris Fetch, M.D.  Electronically Signed   SCH/MEDQ  D:  04/20/2009  T:  04/21/2009  Job:  [id number]   cc:  Jake Bathe, MD  Emeterio Reeve, MD

## 2010-07-23 NOTE — Op Note (Signed)
NAME:  Brett Byrd, Brett Byrd NO.:  [id number]   MEDICAL RECORD NO.:  [id number]                   PATIENT TYPE:  AMB   LOCATION:  ENDO                                 FACILITY[facility]  [facility]   PHYSICIAN:  Petra Kuba, M.D.                 DATE OF BIRTH:  1960/08/16   DATE OF PROCEDURE:  11/06/2003  DATE OF DISCHARGE:                                 OPERATIVE REPORT   PROCEDURE:  Colonoscopy.   INDICATIONS FOR PROCEDURE:  Patient with multiple GI complaints requesting  colonoscopy, nondiagnostic workup to date, probable irritable bowel  syndrome.  Consent was signed after risks, benefits, and options were  thoroughly discussed in the office.   MEDICATIONS USED:  Demerol 70 mg, Versed 7 mg.   PROCEDURE:  Rectal inspection was pertinent for small external hemorrhoids.  Digital exam was negative.  The video pediatric adjustable colonoscope was  inserted and easily advanced through the colon to the cecum.  It did require  some abdominal pressure but no position changes on insertion.  On insertion  some left greater than right diverticula were seen, no other abnormalities  were seen.  The cecum was identified by the appendiceal orifice and the  ileocecal valve.  The scope was inserted a short way in the terminal ileum  which was normal.  Photo documentation was obtained.  The scope was slowly  withdrawn.  The prep was fairly adequate with lots of washing and  suctioning.  Adequate visualization was obtained.  There were diverticula in  all parts of the colon but greater on the left.  The cecum and ascending  were normal.  In the mid transverse, a small polyp was seen, snare and  electrocautery was applied.  The polyp was suctioned through the scope and  collected in the trap.  The scope was withdrawn around the left side of the  colon.  A few tiny left sided polyps were seen and were all hot biopsied x 1  and put in a second container.  No other diverticula, no other  abnormalities  seen as we slowly withdrew back to the rectum.  Anorectal pull through and  retroflexion confirmed some small hemorrhoids.  The scope was straightened  and readvanced a short ways up the left side of the colon, air was  suctioned, the scope was removed.  The patient tolerated the procedure well.  There was no obvious immediate complications.   ENDOSCOPIC DIAGNOSIS:  1.  Internal and external hemorrhoids.  2.  Left greater than right diverticula.  3.  Small transverse polyp status post snare.  4.  A few tiny left sided polyps, hot biopsied.  5.  Otherwise, within normal limits to the terminal ileum.   PLAN:  Await pathology to determine future colonic screening.  Try to avoid  constipation and try to find an antispasmodic that helps him the most.  Await abdominal ultrasound and follow  up p.r.n. or in two months.                                               Petra Kuba, M.D.    MEM/MEDQ  D:  11/06/2003  T:  11/07/2003  Job:  [id number]

## 2010-07-23 NOTE — Op Note (Signed)
NAME:  Brett Byrd, Brett Byrd NO.:  [id number]   MEDICAL RECORD NO.:  [id number]          PATIENT TYPE:  OBV   LOCATION:  0442                         FACILITY:  [facility]   PHYSICIAN:  Ollen Gross. Vernell Morgans, M.D. DATE OF BIRTH:  Oct 05, 1960   DATE OF PROCEDURE:  01/23/2004  DATE OF DISCHARGE:  01/24/2004                                 OPERATIVE REPORT   PREOPERATIVE DIAGNOSIS:  Right inguinal hernia.   POSTOPERATIVE DIAGNOSIS:  Right indirect and direct inguinal hernia.   PROCEDURE:  Right inguinal hernia repair with mesh.   SURGEON:  Dr. Carolynne Edouard   ANESTHESIA:  General endotracheal.   DESCRIPTION OF PROCEDURE:  After informed consent was obtained, the patient  was brought to the operating room and placed in a supine position on the  operating room table.  After adequate induction of general anesthesia, the  patient's abdomen was prepped with Betadine and draped in the usual sterile  manner.  The right groin was then infiltrated with 0.25% Marcaine.  A small  incision was made from the edge of the pubic tubercle on the right towards  the anterior-superior iliac spine.  This incision was carried down through  the skin and subcutaneous tissue sharply with the electrocautery.  A small  bridging vein was encountered that was clamped with hemostats, divided, and  ligated with 3-0 silk ties.  The dissection was then carried sharply through  the subcutaneous layer until the fascia of the external oblique was  encountered.  The external oblique fascia was opened along its fibers  towards the apex of the external ring.  The ilio inguinal nerve was  identified beneath and was wrapped up in some scar tissue.  It was therefore  clamped with hemostats proximally and distally, divided, and ligated with 3-  0 silk ties.  A Weitlaner retractor was used to retract the sidewalls of the  wound superiorly and inferiorly.  Blunt dissection was then carried out of  the cord structures at the edge of  the pubic tubercle until the cord  structures could be surrounded between 2 fingers.  A half-inch Penrose drain  was then placed around the cord structures for retraction purposes.  There  was a small defect noted medial to the cord and the floor of the inguinal  canal.  This was reapproximated with interrupted 0 Vicryl stitches.  The  cord structures were then skeletonized by a combination of blunt dissection  with hemostats and sharp dissection with the electrocautery.  Care was taken  not to injure the rest of the cord structures.  A small hernia sac was  identified.  The hernia sac was gently dissected away from the rest of the  cord structures.  The sac was then opened with the Metzenbaum scissors.  There were no visceral contents within the sac.  The sac was then ligated  near its base with a 2-0 silk suture ligature, and the distal sac was  excised.  Stump of the sac was allowed to retract beneath the transversalis  layer.  At this point, a piece of 3 x 6 Davol mesh was  chosen and cut to  fit.  The mesh was sewed inferiorly to the shelving edge of the inguinal  ligament with a running 2-0 Prolene stitch.  Tails were cut in the mesh  laterally, and the tails were wrapped around the cord structures.  The mesh  was sewed superiorly _to the musculoaponeurotic layer of the transversalis  with interrupted 2-0 Prolene vertical mattress stitches, and the tails were  anchored laterally with interrupted 2-0 Prolene stitch.  Once this was  accomplished, the mesh was in good position without any tension.  The wound  was irrigated with copious amounts of saline.  The external oblique was then  reapproximated with a running 2-0 Vicryl stitch.  The wound was infiltrated  with 0.25% Marcaine, and the subcutaneous fascia was closed with a running 3-  0 Vicryl stitch.  The skin was closed with  running 4-0 Monocryl subcuticular stitch.  Benzoin and Steri-Strips and  sterile dressings were applied.   The patient tolerated the procedure well.  At the end of the case, all needle, sponge, and instrument counts were  correct.  The patient was then awakened and taken to recovery in stable  condition.     Renae Fickle   PST/MEDQ  D:  01/26/2004  T:  01/26/2004  Job:  [id number]

## 2010-12-08 LAB — POCT CARDIAC MARKERS
Troponin i, poc: <0.05
Troponin i, poc: <0.05

## 2010-12-08 LAB — POCT I-STAT, CHEM 8
Calcium, Ion: 1.06 — ABNORMAL LOW
Creatinine, Ser: 1.2
Glucose, Bld: 93
HCT: 41
Hemoglobin: 13.9
Potassium: 5.6 — ABNORMAL HIGH

## 2010-12-08 LAB — DIFFERENTIAL
Basophils Relative: 1
Lymphocytes Relative: 8 — ABNORMAL LOW
Neutro Abs: 9.4 — ABNORMAL HIGH

## 2010-12-08 LAB — CBC
Hemoglobin: 13.6
RBC: 4.27

## 2010-12-14 LAB — CBC
HCT: 51.3
MCV: 93.1
Platelets: 331
RBC: 5.51
WBC: 13.3 — ABNORMAL HIGH

## 2010-12-14 LAB — COMPREHENSIVE METABOLIC PANEL
AST: 19
Albumin: 3.6
Alkaline Phosphatase: 108
BUN: 23
CO2: 24
Chloride: 105
GFR calc Af Amer: >60
GFR calc non Af Amer: 51 — ABNORMAL LOW
Potassium: 3.3 — ABNORMAL LOW
Total Bilirubin: 1

## 2010-12-14 LAB — URINALYSIS, ROUTINE W REFLEX MICROSCOPIC
Bilirubin Urine: NEGATIVE
Hgb urine dipstick: NEGATIVE
Protein, ur: NEGATIVE
Urobilinogen, UA: 0.2

## 2010-12-14 LAB — DIFFERENTIAL
Basophils Absolute: 0.1
Basophils Relative: 1
Eosinophils Relative: 0
Monocytes Absolute: 1.1 — ABNORMAL HIGH

## 2010-12-14 LAB — LIPASE, BLOOD: Lipase: 27

## 2012-06-27 ENCOUNTER — Ambulatory Visit
Admission: RE | Admit: 2012-06-27 | Discharge: 2012-06-27 | Disposition: A | Discharge status: Home / Self Care | Payer: Medicare Other | Source: Ambulatory Visit | Attending: Unknown Physician Specialty | Admitting: Unknown Physician Specialty

## 2012-06-27 ENCOUNTER — Other Ambulatory Visit: Payer: Self-pay | Admitting: Unknown Physician Specialty

## 2012-06-27 DIAGNOSIS — R109 Unspecified abdominal pain: Secondary | ICD-10-CM

## 2013-10-16 ENCOUNTER — Ambulatory Visit: Payer: Medicare Other | Admitting: Cardiology

## 2013-10-23 ENCOUNTER — Encounter: Payer: Self-pay | Admitting: Cardiology

## 2014-02-03 ENCOUNTER — Ambulatory Visit: Payer: Medicare Other | Admitting: Cardiology

## 2014-02-13 ENCOUNTER — Encounter: Payer: Self-pay | Admitting: Cardiology

## 2014-03-07 HISTORY — PX: HX COLECTOMY: SHX59

## 2014-10-27 ENCOUNTER — Ambulatory Visit: Payer: Medicare Other | Admitting: Cardiology

## 2014-10-29 ENCOUNTER — Encounter: Payer: Self-pay | Admitting: Cardiology

## 2015-03-08 DIAGNOSIS — K922 Gastrointestinal hemorrhage, unspecified: Secondary | ICD-10-CM

## 2015-03-08 HISTORY — DX: Gastrointestinal hemorrhage, unspecified: K92.2

## 2015-10-19 ENCOUNTER — Ambulatory Visit: Payer: Self-pay | Admitting: Cardiology

## 2015-12-04 ENCOUNTER — Encounter: Payer: Self-pay | Admitting: Cardiology

## 2015-12-04 ENCOUNTER — Ambulatory Visit (INDEPENDENT_AMBULATORY_CARE_PROVIDER_SITE_OTHER): Payer: Medicare HMO | Admitting: Cardiology

## 2015-12-04 ENCOUNTER — Encounter (INDEPENDENT_AMBULATORY_CARE_PROVIDER_SITE_OTHER): Payer: Self-pay

## 2015-12-04 VITALS — BP 132/86 | HR 57 | Ht 68.5 in | Wt 204.4 lb

## 2015-12-04 DIAGNOSIS — I10 Essential (primary) hypertension: Secondary | ICD-10-CM | POA: Diagnosis not present

## 2015-12-04 DIAGNOSIS — Z952 Presence of prosthetic heart valve: Secondary | ICD-10-CM

## 2015-12-04 DIAGNOSIS — R9431 Abnormal electrocardiogram [ECG] [EKG]: Secondary | ICD-10-CM | POA: Diagnosis not present

## 2015-12-04 DIAGNOSIS — Z954 Presence of other heart-valve replacement: Secondary | ICD-10-CM

## 2015-12-04 NOTE — Progress Notes (Signed)
Cardiology Office Note    Date:  12/04/2015   ID:  Brett Byrd, DOB 17-Sep-1960, MRN [id number]  PCP:  Brett Sermons, Byrd  Cardiologist:   Brett Schultz, Byrd     History of Present Illness:  Brett Byrd is a 55 y.o. male status post aortic valve replacement, Brett Byrd in 2011. Has hypertension, hyperlipidemia as well.  Brett Box, Byrd in [facility].   He has been doing well, exercising 3 days a week at the gym. Lives in [place] now. Feeling well. Occasional chest wall discomfort. He has had hernia repairs. Doing [place] exercises.  No evidence of abdominal aortic aneurysm 1 vascular screening, mild carotid plaque. Blood pressure excellent. Father had heart attack at age 80.  Denies any syncope, bleeding, orthopnea, PND  Past Medical History:  Diagnosis Date  . BPH (benign prostatic hyperplasia)   . Diverticula of colon   . Epilepsy (HCC)   . Hyperlipidemia   . Hypertension   . S/P aortic valve replacement   . Thyroid disease     No past surgical history on file.  Current Medications: Outpatient Medications Prior to Visit  Medication Sig Dispense Refill  . amLODipine (NORVASC) 10 MG tablet Take 10 mg by mouth daily.    . Cholecalciferol 5000 UNITS capsule Take 5,000 Units by mouth daily.    Marland Kitchen levETIRAcetam (KEPPRA) 500 MG tablet Patient takes 1/2 by mouth in the morning and 1 by mouth at bedtime    . levothyroxine (SYNTHROID, LEVOTHROID) 100 MCG tablet Take 100 mcg by mouth daily before breakfast.    . Omega-3 Fatty Acids (FISH OIL) 1000 MG CAPS Take by mouth.    . sulfamethoxazole-trimethoprim (BACTRIM DS) 800-160 MG per tablet Take 1 tablet by mouth 2 (two) times daily.    . tadalafil (CIALIS) 5 MG tablet Take 5 mg by mouth as needed for erectile dysfunction.    . vitamin B-12 (CYANOCOBALAMIN) 1000 MCG tablet Take 1,000 mcg by mouth daily.    . vitamin C (ASCORBIC ACID) 500 MG tablet Take 500 mg by mouth daily.    .  vitamin E 100 UNIT capsule Take by mouth daily.    Marland Kitchen zinc gluconate 50 MG tablet Take 50 mg by mouth daily.    Marland Kitchen aspirin 81 MG tablet Take 81 mg by mouth daily.    . simvastatin (ZOCOR) 10 MG tablet Take 10 mg by mouth daily.     No facility-administered medications prior to visit.      Allergies:   Brett Byrd stool softener [dss] and Brett Byrd   Social History   Social History  . Marital status: Married    Spouse name: N/A  . Number of children: N/A  . Years of education: N/A   Social History Main Topics  . Smoking status: Never Smoker  . Smokeless tobacco: None  . Alcohol use None  . Drug use: Unknown  . Sexual activity: Not Asked   Other Topics Concern  . None   Social History Narrative  . None     Family History:  The patient's Father had aneyrsym,. Died on job.   ROS:   Please see the history of present illness.    ROS All other systems reviewed and are negative.   PHYSICAL EXAM:   VS:  BP 132/86   Pulse (!) 57   Ht 5' 8.5" (1.74 m)   Wt 204 lb 6.4 oz (92.7 kg)   BMI 30.63 kg/m    GEN: Well nourished,  well developed, in no acute distress  HEENT: normal  Neck: no JVD, carotid bruits, or masses Cardiac: RRR; 2/6 SM murmurs, no rubs, or gallops,no edema  Respiratory:  clear to auscultation bilaterally, normal work of breathing GI: soft, nontender, nondistended, + BS MS: no deformity or atrophy  Skin: warm and dry, no rash Neuro:  Alert and Oriented x 3, Strength and sensation are intact Psych: euthymic mood, full affect  Wt Readings from Last 3 Encounters:  12/04/15 204 lb 6.4 oz (92.7 kg)  03/04/10 205 lb (93 kg)  12/14/09 203 lb (92.1 kg)      Studies/Labs Reviewed:   EKG:  EKG is ordered today.  The ekg ordered today demonstrates 12/04/15-sinus bradycardia rate 57, T wave inversion noted in V3 through V6 as well as lead 2, 1 with flattening in 3 and aVF.  Recent Labs: No results found for requested labs within last 8760 hours.   Lipid Panel No  results found for: CHOL, TRIG, HDL, CHOLHDL, VLDL, LDLCALC, LDLDIRECT  Additional studies/ records that were reviewed today include:  Echocardiogram-pending    ASSESSMENT:    1. S/P aortic valve replacement   2. Essential hypertension   3. Abnormal ECG      PLAN:  In order of problems listed above:  Aortic valve replacement via right mini thoracotomy on March 31, 2009.   - Doing well. He does have some mild shortness of breath with activity however this has been long-standing. No significant changes. No chest pain that is exertional.  - Some shortness of breath with activity-we will check echocardiogram. Echo will be helpful to monitor his aortic valve since it is been approximately 6-7 years since placement.  Abnormal EKG  - T wave inversions noted on EKG, no significant changes from prior.  Essential hypertension  - Primary care physician has been monitoring. Overall well controlled.   Medication Adjustments/Labs and Tests Ordered: Current medicines are reviewed at length with the patient today.  Concerns regarding medicines are outlined above.  Medication changes, Labs and Tests ordered today are listed in the Patient Instructions below. Patient Instructions  Medication Instructions:  The current medical regimen is effective;  continue present plan and medications.  Testing/Procedures: Your physician has requested that you have an echocardiogram. Echocardiography is a painless test that uses sound waves to create images of your heart. It provides your doctor with information about the size and shape of your heart and how well your heart's chambers and valves are working. This procedure takes approximately one hour. There are no restrictions for this procedure.  Follow-Up: Follow up in 1 year with Dr. Anne FuSkains.  You will receive a letter in the mail 2 months before you are due.  Please call us when you receive this letter to schedule your follow up appointment.  If you need  a refill on your cardiac medications before your next appointment, please call your pharmacy.  Thank you for choosing [facility]!!        Signed, Brett SchultzMark Skains, Byrd  12/04/2015 9:38 AM    [facility] [address], [place], [place]  [address] Phone: [phone]; Fax: [phone]

## 2015-12-04 NOTE — Patient Instructions (Signed)

## 2015-12-21 ENCOUNTER — Ambulatory Visit (HOSPITAL_COMMUNITY): Payer: Medicare HMO

## 2015-12-28 ENCOUNTER — Other Ambulatory Visit (HOSPITAL_COMMUNITY): Payer: Medicare HMO

## 2015-12-28 ENCOUNTER — Telehealth: Payer: Self-pay | Admitting: Cardiology

## 2015-12-28 NOTE — Telephone Encounter (Signed)
Walk In Pt Form-Patient request call back From Dr.Skain's-[facility]

## 2016-01-05 ENCOUNTER — Telehealth: Payer: Self-pay | Admitting: Cardiology

## 2016-01-05 NOTE — Telephone Encounter (Signed)
Spoke with Mr. Marjo BickerHardman to reschedule the Echo he was to have on 10-17 which was cancel due to he was in the hospital.   [facility] spoke with patient on 10/17 to reschedule the appointment for 12-28-15.   Patient was 30 minutes late for the appointment on 12-28-15.  Was informed that we will need to reschedule due to he was late , patient didn't reschedule at that time.   After speaking with him today he stated that has another doctor.

## 2016-01-06 NOTE — Telephone Encounter (Signed)
Thank you for the update!

## 2016-01-06 NOTE — Telephone Encounter (Signed)
Noted thank you

## 2016-04-05 HISTORY — PX: HX UPPER ENDOSCOPY: 2100001144

## 2017-05-10 ENCOUNTER — Ambulatory Visit (INDEPENDENT_AMBULATORY_CARE_PROVIDER_SITE_OTHER): Payer: Self-pay | Admitting: Nurse Practitioner

## 2017-06-05 ENCOUNTER — Ambulatory Visit: Payer: Medicare PPO | Attending: Nurse Practitioner | Admitting: Nurse Practitioner

## 2017-06-05 ENCOUNTER — Encounter (INDEPENDENT_AMBULATORY_CARE_PROVIDER_SITE_OTHER): Payer: Self-pay | Admitting: Nurse Practitioner

## 2017-06-05 VITALS — BP 126/84 | HR 72 | Temp 98.0°F | Resp 16 | Ht 69.0 in | Wt 199.0 lb

## 2017-06-05 DIAGNOSIS — K229 Disease of esophagus, unspecified: Secondary | ICD-10-CM | POA: Insufficient documentation

## 2017-06-05 DIAGNOSIS — K2289 Other specified disease of esophagus: Secondary | ICD-10-CM | POA: Insufficient documentation

## 2017-06-05 DIAGNOSIS — E785 Hyperlipidemia, unspecified: Secondary | ICD-10-CM | POA: Insufficient documentation

## 2017-06-05 DIAGNOSIS — M545 Low back pain, unspecified: Secondary | ICD-10-CM

## 2017-06-05 DIAGNOSIS — G40909 Epilepsy, unspecified, not intractable, without status epilepticus: Secondary | ICD-10-CM | POA: Insufficient documentation

## 2017-06-05 DIAGNOSIS — M25532 Pain in left wrist: Secondary | ICD-10-CM | POA: Insufficient documentation

## 2017-06-05 DIAGNOSIS — I10 Essential (primary) hypertension: Principal | ICD-10-CM | POA: Insufficient documentation

## 2017-06-05 DIAGNOSIS — Q639 Congenital malformation of kidney, unspecified: Secondary | ICD-10-CM | POA: Insufficient documentation

## 2017-06-05 DIAGNOSIS — K228 Other specified diseases of esophagus: Secondary | ICD-10-CM

## 2017-06-05 DIAGNOSIS — D229 Melanocytic nevi, unspecified: Secondary | ICD-10-CM

## 2017-06-05 DIAGNOSIS — Z6829 Body mass index (BMI) 29.0-29.9, adult: Secondary | ICD-10-CM | POA: Insufficient documentation

## 2017-06-05 DIAGNOSIS — E039 Hypothyroidism, unspecified: Secondary | ICD-10-CM | POA: Insufficient documentation

## 2017-06-05 MED ORDER — CYCLOBENZAPRINE 10 MG TABLET
10.00 mg | ORAL_TABLET | Freq: Every evening | ORAL | 0 refills | Status: DC
Start: 2017-06-05 — End: 2017-10-09

## 2017-06-05 NOTE — Progress Notes (Signed)
[place], APRN  [address]  [place] [address]     Chief Complaint    Establish Care (Pain in wrist and back and some black spots on his back.)        History of Present Illness    This patient is a 57 y.o. male who is being seen in the office today for evaluation and establishment of PCP.  He states that he recently moved to [facility] from [facility] and has not gotten a PCP in the area yet.  He has an extensive medical history that was reviewed with him.  He states that he is going to continue to see some of his specialists in [place] and would like to see a Neurologist in [place] for his history of seizures.  He states that he had a neurology visit last month.  He has a history of an esophageal mass that he has been following with GI for and they have just been monitoring that for several years.  He has had a history of a aortic valve Bovine replacement and follows with Cardiology for that and he will continue to follow with his cardiologist.  He states that he recently had lab work done in [place].  He is complaining today of low back pain and left wrist pain.  He does not know if he did something to them while he was moving their stuff.  He has not tried to take any medications for that.   He states that he does take Flexeril for his muscle spasms that he gets every night and has taken that for a long time.  He would like a refill of that.  He also states that he has a mole on his back that he would like to have checked.  He states that he has never asked anyone to look at it but he does not feel that it has changed any.  He denies any other problems or concerns today.  Social/family history reviewed with patient.         Allergies    Allergies   Allergen Reactions   . Statins-Hmg-Coa Reductase Inhibitors        Patient History    Past Medical History has been reviewed, confirmed, and as follows below.  History obtained from Patient    Past Medical History:      Diagnosis Date   . BPH (benign prostatic hyperplasia)    . Congenital anomaly of kidney    . Epilepsy (CMS [place])    . High blood pressure    . Hx of aortic valve replacement    . Hyperlipidemia    . Hypothyroidism    . Mass of esophagus    . Nodular prostate    . Palpitations    . Testicular hypofunction      Past Surgical History:   Procedure Laterality Date   . COLONOSCOPY     . HX AORTIC VALVE REPLACEMENT  2009   . HX COLECTOMY     . HX UPPER ENDOSCOPY  04/05/2016   . INCISIONAL HERNIA REPAIR     . VENTRAL HERNIA REPAIR       Family Medical History:     Problem Relation (Age of Onset)    Coronary Artery Disease Mother, Father    Epilepsy Sister    Hypertension Sister            Social History     Socioeconomic History   .  Marital status: Married     Spouse name: Not on file   . Number of children: Not on file   . Years of education: Not on file   . Highest education level: Not on file   Occupational History   . Not on file   Social Needs   . Financial resource strain: Not on file   . Food insecurity:     Worry: Not on file     Inability: Not on file   . Transportation needs:     Medical: Not on file     Non-medical: Not on file   Tobacco Use   . Smoking status: Never Smoker   . Smokeless tobacco: Never Used   Substance and Sexual Activity   . Alcohol use: Yes     Alcohol/week: 0.6 oz     Types: 1 Glasses of wine per week     Binge frequency: Less than monthly   . Drug use: Not on file   . Sexual activity: Not on file   Lifestyle   . Physical activity:     Days per week: Not on file     Minutes per session: Not on file   . Stress: Not on file   Relationships   . Social connections:     Talks on phone: Not on file     Gets together: Not on file     Attends religious service: Not on file     Active member of club or organization: Not on file     Attends meetings of clubs or organizations: Not on file     Relationship status: Not on file   . Intimate partner violence:     Fear of current or ex partner: Not on file      Emotionally abused: Not on file     Physically abused: Not on file     Forced sexual activity: Not on file   Other Topics Concern   . Not on file   Social History Narrative   . Not on file       Current Outpatient Medications:   .  amitriptyline (ELAVIL) 10 mg Oral Tablet, Take 10 mg by mouth Every night, Disp: , Rfl:   .  amLODIPine (NORVASC) 10 mg Oral Tablet, Take 10 mg by mouth Once a day, Disp: , Rfl:   .  cyclobenzaprine (FLEXERIL) 10 mg Oral Tablet, Take 1 Tab (10 mg total) by mouth Every night for 90 days, Disp: 90 Tab, Rfl: 0  .  docusate sodium (COLACE) 100 mg Oral Capsule, Take 100 mg by mouth Twice daily, Disp: , Rfl:   .  fluticasone propionate (FLONASE) 50 mcg/actuation Nasal Spray, Suspension, 2 Sprays by Each Nostril route Once a day, Disp: , Rfl:   .  levETIRAcetam (KEPPRA) 500 mg Oral Tablet, Take 500 mg by mouth Twice daily 1/2 tablet in the AM and 1 whole tablet in the PM, Disp: , Rfl:   .  levothyroxine (SYNTHROID) 100 mcg Oral Tablet, Take 100 mcg by mouth Every morning, Disp: , Rfl:   .  losartan (COZAAR) 50 mg Oral Tablet, Take 50 mg by mouth Once a day, Disp: , Rfl:   .  pantoprazole (PROTONIX) 40 mg Oral Tablet, Delayed Release (E.C.), Take 40 mg by mouth Twice daily, Disp: , Rfl:   .  polyethylene glycol (MIRALAX) 17 gram/dose Oral Powder, Take 17 g by mouth Once a day, Disp: , Rfl:   .  tamsulosin (FLOMAX)  0.4 mg Oral Capsule, Take 0.4 mg by mouth Every evening after dinner, Disp: , Rfl:   .  vitamin B complex Oral Tablet, Take 1 Tab by mouth Once a day, Disp: , Rfl:       Review of Systems    Review of systems was obtained and is unremarkable except as stated in HPI.    Vital Signs    Most Recent Vitals      Office Visit from 06/05/2017 in Internal Medicine, [place]  36.7 C (98 F) filed at... 06/05/2017 1317   Heart Rate  72 filed at... 06/05/2017 1317   Respiratory Rate  16 filed at... 06/05/2017 1317   BP (Non-Invasive)  126/84 filed at...  06/05/2017 1317   Height  1.753 m (5\' 9" ) filed at... 06/05/2017 1317   Weight  90.3 kg (199 lb) filed at... 06/05/2017 1317   BMI (Calculated)  29.45 filed at... 06/05/2017 1317   BSA (Calculated)  2.1 filed at... 06/05/2017 1317            Physical Exam    Constitutional 57 y.o.  male, in no acute distress at time of exam.    Eyes PERRL, EOMI, no obvious sign of infection.  Ears, Nose, Throat: Ears Clear, TM's intact, Nose patent and without significant mucosal abnormality, Oral cavity clear, Pharynx benign.    Neck without mass or adenopathy, trachea midline.    Cardiovascular regular heart rate and rhythm, distolic murmur apically, no gallops or rubs.  No edema.  Respiratory Respirations even and unlabored. Lungs clear to auscultation. No extension of expiratory phase.   Gastrointestinal Soft, non-tender, no distension, no obvious mass, organomegaly or bruit. Integumentary No cyanosis noted.  3 small pea sized Hillarie Harrigan moles noted.  Psychiatric Alert and Oriented x 4 at office visit today.  Affect appropriate to content.  No obvious Psychopathology.    Hematologic/Lymphatic No Lymphadenopathy, no abnormal bruising.  Musculoskeletal:  Stable gait and station, no paraspinal tenderness noted.  Left wrist with normal ROM, no edema, no pain on movement.       Impressions/Plan    (I10) Essential hypertension  (primary encounter diagnosis)  Plan: continue current medications, will review records from previous providers with further recommendations to follow and possible order for labs.     (E78.5) Hyperlipidemia  Plan: see above    (G40.909) Epilepsy (CMS [place])  Plan: stable at this time, continue current medications.  Will refer to Neurology at next appointment once we receive records and patient chooses where to go.  ROI signed.     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  ROI signed.     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  Further recommendations to follow.    (E03.9) Hypothyroidism  Plan: continue current dose of medication.  Will review previous labs with further recommendations to follow.      (M54.5) Low back pain  Plan: instructed in stretching and take Tylenol for pain.  Discussed possible referral to PT.  Patient will let us know.      (M25.532) Left wrist pain  Plan: Instructed to take Tylenol for pain.      (D22.9) Benign mole  Plan: will monitor            Follow up    Return in about 1 month (around 07/03/2017).    Patient and provider shared in  the decision making process.    Gavin Potters, APRN  06/05/2017, 15:11    Patient seen independently with co-signing physician present in clinic.

## 2017-07-05 ENCOUNTER — Encounter (INDEPENDENT_AMBULATORY_CARE_PROVIDER_SITE_OTHER): Payer: Self-pay | Admitting: Nurse Practitioner

## 2017-07-05 ENCOUNTER — Ambulatory Visit: Payer: Medicare PPO | Attending: Nurse Practitioner | Admitting: Nurse Practitioner

## 2017-07-05 VITALS — BP 110/80 | HR 72 | Temp 97.9°F | Resp 16 | Ht 69.0 in | Wt 196.4 lb

## 2017-07-05 DIAGNOSIS — Z952 Presence of prosthetic heart valve: Secondary | ICD-10-CM | POA: Insufficient documentation

## 2017-07-05 DIAGNOSIS — E039 Hypothyroidism, unspecified: Secondary | ICD-10-CM | POA: Insufficient documentation

## 2017-07-05 DIAGNOSIS — M549 Dorsalgia, unspecified: Secondary | ICD-10-CM

## 2017-07-05 DIAGNOSIS — I10 Essential (primary) hypertension: Secondary | ICD-10-CM | POA: Insufficient documentation

## 2017-07-05 DIAGNOSIS — M545 Low back pain: Secondary | ICD-10-CM | POA: Insufficient documentation

## 2017-07-05 DIAGNOSIS — G40909 Epilepsy, unspecified, not intractable, without status epilepticus: Secondary | ICD-10-CM

## 2017-07-05 DIAGNOSIS — M25519 Pain in unspecified shoulder: Secondary | ICD-10-CM

## 2017-07-05 DIAGNOSIS — Z6829 Body mass index (BMI) 29.0-29.9, adult: Secondary | ICD-10-CM | POA: Insufficient documentation

## 2017-07-05 NOTE — Progress Notes (Signed)
[place], APRN  [address]  [place] [address]     Chief Complaint    Check Up        History of Present Illness    This patient is a 57 y.o. male who is being seen in the office today for follow up.  He states that he needs to be referred to Dr. Adonis Housekeeper of Dr. Cecille Aver at [facility] for his epilepsy.  His Neurologist at [facility] gave him these two names.  He had signed for release of records at first visit and we did not receive them so we will get new ROI today.  He has also decided to establish with a cardiologist locally to follow with, at first he was going to stay with his cardiologist at [facility],  He did have a Bovine Valve Replacement more than 10 years ago. He states that he has not had any problems since he had that and does not have any chest pain or shortness of breath.  Patient is a poor historian. He is also complaining of pain in his low back and shoulders and he states that he has been running a weed eater and he thinks that it could be making the pain worse.  He does take Flexeril at bedtime for this.  He denies any other problems or concerns.  Social/family history reviewed with patient.         Allergies    Allergies   Allergen Reactions   . Statins-Hmg-Coa Reductase Inhibitors        Patient History    Past Medical History has been reviewed, confirmed, and as follows below.  History obtained from Patient    Past Medical History:   Diagnosis Date   . BPH (benign prostatic hyperplasia)    . Congenital anomaly of kidney    . Epilepsy (CMS [place])    . High blood pressure    . Hx of aortic valve replacement    . Hyperlipidemia    . Hypothyroidism    . Mass of esophagus    . Nodular prostate    . Palpitations    . Testicular hypofunction      Past Surgical History:   Procedure Laterality Date   . COLONOSCOPY     . HX AORTIC VALVE REPLACEMENT  2009   . HX COLECTOMY     . HX UPPER ENDOSCOPY  04/05/2016   .  INCISIONAL HERNIA REPAIR     . VENTRAL HERNIA REPAIR       Family Medical History:     Problem Relation (Age of Onset)    Coronary Artery Disease Mother, Father    Epilepsy Sister    Hypertension (High Blood Pressure) Sister            Social History     Socioeconomic History   . Marital status: Married     Spouse name: Not on file   . Number of children: Not on file   . Years of education: Not on file   . Highest education level: Not on file   Tobacco Use   . Smoking status: Never Smoker   . Smokeless tobacco: Never Used   Substance and Sexual Activity   . Alcohol use: Yes     Alcohol/week: 0.6 oz     Types: 1 Glasses of wine per week     Binge frequency: Less than monthly  Current Outpatient Medications:   .  amitriptyline (ELAVIL) 10 mg Oral Tablet, Take 10 mg by mouth Every night, Disp: , Rfl:   .  amLODIPine (NORVASC) 10 mg Oral Tablet, Take 10 mg by mouth Once a day, Disp: , Rfl:   .  cyclobenzaprine (FLEXERIL) 10 mg Oral Tablet, Take 1 Tab (10 mg total) by mouth Every night for 90 days, Disp: 90 Tab, Rfl: 0  .  docusate sodium (COLACE) 100 mg Oral Capsule, Take 100 mg by mouth Twice daily, Disp: , Rfl:   .  fluticasone propionate (FLONASE) 50 mcg/actuation Nasal Spray, Suspension, 2 Sprays by Each Nostril route Once a day, Disp: , Rfl:   .  levETIRAcetam (KEPPRA) 500 mg Oral Tablet, Take 500 mg by mouth Twice daily 1/2 tablet in the AM and 1 whole tablet in the PM, Disp: , Rfl:   .  levothyroxine (SYNTHROID) 100 mcg Oral Tablet, Take 100 mcg by mouth Every morning, Disp: , Rfl:   .  losartan (COZAAR) 50 mg Oral Tablet, Take 50 mg by mouth Once a day, Disp: , Rfl:   .  OM-3-DHA-EPA-Fish Oil-Vit D3 (FISH OIL-VIT D3) 300-1,000-1,000 mg-mg-unit Oral Capsule, Take by mouth Dosage 2000, mg fish oil/1000 iu Vitamin d3, Disp: , Rfl:   .  pantoprazole (PROTONIX) 40 mg Oral Tablet, Delayed Release (E.C.), Take 40 mg by mouth Twice daily, Disp: , Rfl:   .  polyethylene glycol (MIRALAX) 17 gram/dose Oral Powder,  Take 17 g by mouth Once a day, Disp: , Rfl:   .  tamsulosin (FLOMAX) 0.4 mg Oral Capsule, Take 0.4 mg by mouth Every evening after dinner, Disp: , Rfl:   .  vitamin B complex Oral Tablet, Take 1 Tab by mouth Once a day, Disp: , Rfl:       Review of Systems    Review of systems was obtained and is unremarkable except as stated in HPI.    Vital Signs    Most Recent Vitals      Office Visit from 07/05/2017 in Internal Medicine, [place]   Temperature  36.6 C (97.9 F) filed at... 07/05/2017 1329   Heart Rate  72 filed at... 07/05/2017 1329   Respiratory Rate  16 filed at... 07/05/2017 1329   BP (Non-Invasive)  110/80 filed at... 07/05/2017 1329   Height  1.753 m (5\' 9" ) filed at... 07/05/2017 1329   Weight  89.1 kg (196 lb 6.4 oz) filed at... 07/05/2017 1329   BMI (Calculated)  29.06 filed at... 07/05/2017 1329   BSA (Calculated)  2.08 filed at... 07/05/2017 1329            Physical Exam    Constitutional 57 y.o.  male, in no acute distress at time of exam.    Eyes PERRL, EOMI, no obvious sign of infection.  Ears, Nose, Throat: Ears Clear, TM's intact, Nose patent and without significant mucosal abnormality, Oral cavity clear, Pharynx benign.    Neck without mass or adenopathy, trachea midline.    Cardiovascular regular heart rate and rhythm,systolic ejection murmur noted left sternal border, no gallops or rubs.  No edema.  Respiratory Respirations even and unlabored. Lungs clear to auscultation. No extension of expiratory phase.   Gastrointestinal Soft, non-tender, no distension, no obvious mass, organomegaly or bruit. Integumentary No cyanosis noted.    Psychiatric Alert and Oriented x 4 at office visit today.  Affect appropriate to content.  No obvious Psychopathology.    Hematologic/Lymphatic No Lymphadenopathy, no abnormal  bruising.  Musculoskeletal:  Generalized back pain with ROM, no pain with palpation.      Impressions/Plan    (M54.9) Back pain  (primary encounter diagnosis)  Plan: Refer to  [facility] Physical Therapy        Instructed to take Tylenol as needed for pain, referral to PT given.     (G40.909) Epilepsy (CMS [facility])  Plan: Refer to [facility]        Referral to [facility] and ROI for records from [facility].  Continue current medications.     (M25.519) Shoulder pain  Plan: Refer to [facility] Physical Therapy            (Z95.2) History of heart valve replacement  Plan: CBC/Diff, COMPREHENSIVE METABOLIC PNL, FASTING,        LIPID PANEL, Refer to [facility] at [facility],         [facility] with Free T4 Reflex        Will review labs with further recommendations to follow.  ROI for records from [facility].     [phone]) Essential hypertension  Plan: CBC/Diff, COMPREHENSIVE METABOLIC PNL, FASTING,        LIPID PANEL        Stable at this time, continue current medications.  Will review records with further recommendations to follow.    (E03.9) Hypothyroidism  Plan: TSH with Free T4 Reflex        Continue current medications.  Will review labs with further recommendations to follow.             Follow up    Return in about 3 months (around 10/05/2017).    Patient and provider shared in the decision making process.    Gavin Potters, APRN  07/05/2017, 14:10    Patient seen independently with co-signing physician present in clinic.

## 2017-07-10 ENCOUNTER — Ambulatory Visit: Payer: Medicare PPO | Attending: Nurse Practitioner

## 2017-07-10 DIAGNOSIS — E039 Hypothyroidism, unspecified: Secondary | ICD-10-CM

## 2017-07-10 DIAGNOSIS — Z952 Presence of prosthetic heart valve: Secondary | ICD-10-CM | POA: Insufficient documentation

## 2017-07-10 DIAGNOSIS — I10 Essential (primary) hypertension: Secondary | ICD-10-CM | POA: Insufficient documentation

## 2017-07-10 LAB — CBC WITH DIFF
BASOPHIL #: <0.1 10*3/uL (ref <=0.20)
BASOPHIL %: 1 %
EOSINOPHIL #: 0.16 10*3/uL (ref <=0.50)
EOSINOPHIL %: 3 %
HCT: 48.2 % (ref 38.9–52.0)
HGB: 16 g/dL (ref 13.4–17.5)
IMMATURE GRANULOCYTE #: <0.1 10*3/uL (ref <0.10)
IMMATURE GRANULOCYTE %: 0 % (ref 0–1)
LYMPHOCYTE #: 0.98 10*3/uL — ABNORMAL LOW (ref 1.00–4.80)
LYMPHOCYTE %: 16 %
MCH: 31.9 pg (ref 26.0–32.0)
MCHC: 33.2 g/dL (ref 31.0–35.5)
MCV: 96 fL (ref 78.0–100.0)
MONOCYTE #: 0.6 10*3/uL (ref 0.20–1.10)
MONOCYTE %: 10 %
NEUTROPHIL #: 4.15 10*3/uL (ref 1.50–7.70)
NEUTROPHIL %: 70 %
PLATELETS: 124 10*3/uL — ABNORMAL LOW (ref 150–400)
RBC: 5.02 10*6/uL (ref 4.50–6.10)
RDW-CV: 12.8 % (ref 11.5–15.5)
WBC: 6 10*3/uL (ref 3.7–11.0)

## 2017-07-10 LAB — COMPREHENSIVE METABOLIC PNL, FASTING
ALKALINE PHOSPHATASE: 120 U/L (ref 38–126)
ALT (SGPT): 29 U/L (ref 17–63)
ANION GAP: 7 mmol/L (ref 5–15)
BILIRUBIN TOTAL: 0.5 mg/dL (ref 0.3–1.2)
BUN/CREA RATIO: 15 (ref 6–20)
BUN/CREA RATIO: 15 (ref 6–20)
BUN: 18 mg/dL (ref 8–26)
CHLORIDE: 102 mmol/L (ref 101–111)
CREATININE: 1.19 mg/dL (ref 0.90–1.30)
PROTEIN TOTAL: 7.7 g/dL (ref 6.4–8.3)

## 2017-07-10 LAB — THYROXINE, FREE (FREE T4)
THYROXINE (T4), FREE: 0.8 ng/dL (ref 0.61–1.12)
THYROXINE (T4), FREE: 0.8 ng/dL (ref 0.61–1.12)

## 2017-07-10 LAB — LIPID PANEL
CHOL/HDL RATIO: 4.6 (ref 0.0–5.0)
LDL CALC: 103 mg/dL — ABNORMAL HIGH (ref 0–100)
TRIGLYCERIDES: 209 mg/dL — ABNORMAL HIGH (ref 0–149)

## 2017-07-10 LAB — THYROID STIMULATING HORMONE WITH FREE T4 REFLEX: TSH: 7.388 u[IU]/mL — ABNORMAL HIGH (ref 0.340–5.600)

## 2017-07-10 LAB — MORPHOLOGY: RBC MORPHOLOGY: NORMAL

## 2017-07-19 ENCOUNTER — Encounter (INDEPENDENT_AMBULATORY_CARE_PROVIDER_SITE_OTHER): Payer: Self-pay | Admitting: Neurology

## 2017-07-19 ENCOUNTER — Ambulatory Visit: Payer: Medicare PPO | Attending: Neurology | Admitting: Neurology

## 2017-07-19 DIAGNOSIS — Z79899 Other long term (current) drug therapy: Secondary | ICD-10-CM | POA: Insufficient documentation

## 2017-07-19 DIAGNOSIS — G40909 Epilepsy, unspecified, not intractable, without status epilepticus: Secondary | ICD-10-CM | POA: Insufficient documentation

## 2017-07-19 MED ORDER — LEVETIRACETAM 250 MG TABLET
ORAL_TABLET | ORAL | 4 refills | Status: DC
Start: 2017-07-19 — End: 2017-08-07

## 2017-07-19 NOTE — H&P (Signed)
NEUROLOGY CLINIC, [place]  [facility]ated by [facility]  [place] [address]  Dept: [phone]  Dept Fax: [phone]    Outpatient History and Physical    Date:  07/19/2017  Name: Richard [name]  Age:  57 y.o.  Referring Physician:   Gavin Potters, APRN  [address]  [place], [place] [address]    History of Present Illness  History obtained from: 57 year old male presents for seizures and a problem with light.  He has been having seizures since 2000. He has had approximately 4 or 5 seizures. His last was over 10 years ago. He is on Keppra 250 mg am and 500 mg pm. 500 mg bid made him too tired. He was on Dilantin for about 5 or 6 years and was stopped because he was afraid of "bad effects". He has no problems with Keppra. His last MRI was done in [place] a couple years ago. He does not know what it showed. Last EEG was a couple years ago. He does not know what it showed. When he has his seizures he gets dizzy, he feels like he needs to sit down. He has convulsions.   He complains of light sensitivity. 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      Hematologic/lymphatic:no clotting d/o   Musculoskeletal:has back pain lower pain  Neurological:   Behavioral/Psych: has some depression  Endocrine: no DM    Examination:    Vitals: Ht 1.779 m (5' 10.04")   Wt 87.7 kg (193 lb 5.5 oz)   BMI 27.71 kg/m       General: awake  Ophthalmoscopic:no papilledema  Carotids:rrr  Orientation: oriented  Memory: recalls  Attention: follows  Knowledge: knows president  Language: fluent  Speech: no dysarthria  Cranial nerves: intact  CN2:   CN 3,4,6:   CN 5  CN 7  CN 8:   CN 9,10:   CN 11:   CN 12:     Gait:: steady  Coordination:finger to nose intact  Sensory: symmetric  Muscle tone:no spasticity      Motor - Normal strength throughout except as noted:   Right Left   Deltoid     Biceps     Triceps     Wrist ext     Wrist flex     FDI     APB     Iliopsoas     Quadriceps     Ant tibial     Post tibial     Peronei     Gastroc     Neck flex  Neck ext       Reflexes - Reflexes normal except as noted  Reflexes Right  Left   brachioradialis     biceps     triceps     Patellar     ankle     plantar       Diabetic foot exam:  Sensation normal bilaterally.           Assessment and Plan  Epilepsy: will increase Keppra to 500 mg bid. Schedule MRI brain, EEG, return to clinic 5 months.   Total face-to-face time by staff:   minutes. Greater than 50% of that time was spent on counseling/coordination of care regarding:         Janyce Llanos, MD 07/19/2017, 14:05

## 2017-08-07 ENCOUNTER — Ambulatory Visit (INDEPENDENT_AMBULATORY_CARE_PROVIDER_SITE_OTHER): Payer: Medicare PPO | Admitting: Internal Medicine

## 2017-08-07 ENCOUNTER — Encounter (INDEPENDENT_AMBULATORY_CARE_PROVIDER_SITE_OTHER): Payer: Self-pay | Admitting: Internal Medicine

## 2017-08-07 VITALS — BP 112/80 | HR 85 | Resp 16 | Ht 69.49 in | Wt 193.6 lb

## 2017-08-07 DIAGNOSIS — R079 Chest pain, unspecified: Principal | ICD-10-CM

## 2017-08-07 DIAGNOSIS — I498 Other specified cardiac arrhythmias: Secondary | ICD-10-CM

## 2017-08-07 DIAGNOSIS — Z952 Presence of prosthetic heart valve: Secondary | ICD-10-CM | POA: Insufficient documentation

## 2017-08-07 DIAGNOSIS — I10 Essential (primary) hypertension: Secondary | ICD-10-CM | POA: Insufficient documentation

## 2017-08-07 DIAGNOSIS — R0609 Other forms of dyspnea: Secondary | ICD-10-CM

## 2017-08-07 MED ORDER — ASPIRIN 81 MG TABLET,DELAYED RELEASE
81.0000 mg | DELAYED_RELEASE_TABLET | Freq: Every day | ORAL | Status: DC
Start: 2017-08-07 — End: 2017-11-08

## 2017-08-07 NOTE — H&P (Signed)
[facility]  [address]  [place], [place] [address]    PATIENT NAME: Richard [name], [place] NUMBER:  [id number]  DATE OF SERVICE: 08/07/2017  DATE OF BIRTH:  1960/08/30    HISTORY AND PHYSICAL    PRIMARY CARE PROVIDER:  Gavin Potters, APRN, FNP-BC.    HISTORY OF PRESENT ILLNESS:  The patient is a 57 year old male.     His cardiac history is significant for bioprosthetic aortic valve replacement more than 10 years ago when the patient was living in [place], [place].  Unfortunately, no records are available.  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  He does not recall whether he had a cardiac catheterization prior to this procedure but he has not had any cardiac intervention performed.  He does not have any prior history of myocardial infarction either.  His cardiac risk factors include hypertension and borderline dyslipidemia.  He recalls intolerance to statin medications in the past.  He has now moved to [place] and has been referred to me for further assessment.      He describes a vague discomfort in the center of his chest.  This can come at any time of the day.  It is not associated with physical activities.  There is no history of worsening shortness of breath or orthopnea.  There is no dizziness or syncopal spells.    PAST MEDICAL HISTORY:  Includes essential hypertension, status post bioprosthetic aortic valve replacement.  He has history of seizures and hypothyroidism.    FAMILY HISTORY:  Includes coronary artery disease in the patient's father but not at a premature age.  Sister has had carotid endarterectomy and also has a history of epilepsy.    SOCIAL HISTORY:  The patient has moved to [place] from [place].  He is a nonsmoker.  No excessive caffeine or alcohol intake.      REVIEW OF SYSTEMS:  No fever or chills.  He denies any new visual or hearing complaints.  Nonspecific chest discomfort symptoms.  These  can come at any time of the day.  He denies any worsening shortness of breath.  No orthopnea.  No nausea or vomiting.  All others are reportedly negative.    MEDICATIONS:  1. I have asked him to start taking an aspirin 81 mg once daily.  2. Losartan 50 mg once a day.  3. Amlodipine 10 mg once a day.    4. Fish oil.      Noncardiac medications include Elavil, Flexeril p.r.n., Colace, Flonase nasal spray, Keppra, Synthroid, Protonix, MiraLAX and Flomax.    OBJECTIVE:  Vitals:    08/07/17 1445   BP: 112/80   Pulse: 85   Resp: 16   SpO2: 96%   Weight: 87.8 kg (193 lb 9.6 oz)   Height: 1.765 m (5' 9.49")   BMI: 28.25     On examination, the patient is a middle-aged male who is sitting in a chair in no distress.    HEENT is atraumatic, normocephalic.    Neck does not show any JVD.  No carotid bruits were heard.    Chest is clear to auscultation bilaterally with no wheezing.    CVS is regular rate and rhythm.  Normal S1, S2.  There is a 2/6 systolic murmur in the aortic area.    Abdomen is soft.  Positive bowel sounds.    Extremities show no edema.    Neurologically, he is  alert and oriented x3.    Skin is warm and dry.    Psych:  Affect is normal.     His baseline labs from 07/2017 show hemoglobin 16, WBC 6000, platelets 124,000, sodium 137, potassium 4.3, BUN 28, creatinine 1.19.  Lipid panel shows cholesterol 185, HDL 40, LDL 103.  TSH elevated at 7.38.  Hepatic function panel is stable with albumin of 4.2.      EKG today shows sinus rhythm with pulse 76 beats per minute.  Nonspecific T-wave abnormality.    ASSESSMENT AND PLAN:  A 57 year old male with:    1. Status post bioprosthetic aortic valve replacement:  The patient gives a history of undergoing bioprosthetic AVR more than 10 years ago when he was living in [place], [place].  He recalls that he had a bicuspid aortic valve which had gotten severe insufficiency.  No cardiac records are available.  He describes nonspecific chest discomfort symptoms, which  can come at any time of the day.  We will continue his workup by getting a transthoracic cardiac echocardiogram and a myocardial perfusion stress test.  I encouraged the patient to start taking aspirin 81 mg once daily.  The patient knows that he has to take antibiotic prophylaxis before any invasive procedure because of his history of bioprosthetic aortic valve replacement.  2. Hypertension.  He is on Norvasc 10 mg once daily and losartan 50 mg once a day.  3. Dyslipidemia.  Lipid panel is fairly well controlled with fish oil  medication.  He has been intolerant to statin therapy.   4. He has been given a followup appointment in 6 months.  I will contact him with the results of his cardiac tests.        Domenic Moras, MD  Assistant Professor   [facility] Department of Medicine, Section of Cardiology         CC:   Gavin Potters, APRN, FNP-BC       DD:  08/07/2017 15:17:05  DT:  08/07/2017 21:06:08 DG  D#:  [id number]

## 2017-08-16 ENCOUNTER — Other Ambulatory Visit (INDEPENDENT_AMBULATORY_CARE_PROVIDER_SITE_OTHER): Payer: Self-pay | Admitting: Radiology

## 2017-08-16 ENCOUNTER — Other Ambulatory Visit (INDEPENDENT_AMBULATORY_CARE_PROVIDER_SITE_OTHER): Payer: Self-pay

## 2017-08-30 ENCOUNTER — Ambulatory Visit
Admission: RE | Admit: 2017-08-30 | Discharge: 2017-08-30 | Disposition: A | Discharge status: Home / Self Care | Payer: Medicare PPO | Source: Ambulatory Visit | Admitting: Radiology

## 2017-08-30 DIAGNOSIS — M899 Disorder of bone, unspecified: Secondary | ICD-10-CM

## 2017-08-30 DIAGNOSIS — E348 Other specified endocrine disorders: Secondary | ICD-10-CM

## 2017-08-30 DIAGNOSIS — G40909 Epilepsy, unspecified, not intractable, without status epilepticus: Secondary | ICD-10-CM

## 2017-08-30 DIAGNOSIS — I619 Nontraumatic intracerebral hemorrhage, unspecified: Secondary | ICD-10-CM

## 2017-08-30 LAB — POC ISTAT CREATININE (RESULT): CREATININE, POC: 1.2 mg/dL (ref 0.62–1.27)

## 2017-08-30 MED ORDER — GADOBUTROL 10 MMOL/10 ML (1 MMOL/ML) INTRAVENOUS SOLUTION
8.50 mL | INTRAVENOUS | Status: AC
Start: 2017-08-30 — End: 2017-08-30
  Administered 2017-08-30: 16:00:00 8.5 mL via INTRAVENOUS

## 2017-08-30 MED ADMIN — dextrose 5 % and lactated ringers intravenous solution: INTRAVENOUS | @ 16:00:00 | NDC 00338012504

## 2017-09-04 ENCOUNTER — Ambulatory Visit
Admission: RE | Admit: 2017-09-04 | Discharge: 2017-09-04 | Disposition: A | Discharge status: Home / Self Care | Payer: Medicare PPO | Source: Ambulatory Visit | Attending: Neurology | Admitting: Neurology

## 2017-09-04 DIAGNOSIS — G40909 Epilepsy, unspecified, not intractable, without status epilepticus: Secondary | ICD-10-CM | POA: Insufficient documentation

## 2017-09-04 NOTE — Procedures (Signed)
NAME:  Richard [name], Richard [name] NUMBER:  [id number]  DATE OF SERVICE:  09/04/2017  DOB:  06/05/1960  SEX:  M      Date and Time of Study:  September 04, 2017, from 1006 to 1159.   Technician:  PD.   EEG #:  [id number].    REQUESTING PROVIDER:  Janyce Llanos, MD.    HISTORY:  A 57 year old male with seizures.    INTERPRETATION:  Normal, awake and mostly drowsy EEG.  A normal EEG does not rule out epilepsy.  However, there was no evidence of underlying seizure tendency on this recording.  Clinical correlation required.    DESCRIPTION:  This is a digitally acquired routine EEG with electrodes placed in accordance with the international 10-20 system.     The posterior dominant rhythm was maximally 9 Hz.  The waking background was continuous and symmetric consisting of well-organized alpha and beta.  Most of the recording captured drowsiness.  There were no N2 sleep transients.     There were no persistent focal abnormalities, interictal epileptiform discharges, electrographic seizures, or clinical spells.     Photic stimulation elicited no abnormalities.  Hyperventilation was not performed.        Ilda Mori, MD  Assistant Professor   [facility] Department of Pediatrics and Neurology        CC:   Janyce Llanos, MD       DD:  09/04/2017 17:35:07  DT:  09/04/2017 22:45:28 [place]  D#:  [id number]

## 2017-09-06 ENCOUNTER — Other Ambulatory Visit (INDEPENDENT_AMBULATORY_CARE_PROVIDER_SITE_OTHER): Payer: Self-pay | Admitting: Neurology

## 2017-09-06 ENCOUNTER — Encounter (INDEPENDENT_AMBULATORY_CARE_PROVIDER_SITE_OTHER): Payer: Self-pay | Admitting: Neurology

## 2017-09-06 DIAGNOSIS — R200 Anesthesia of skin: Secondary | ICD-10-CM

## 2017-09-06 DIAGNOSIS — M899 Disorder of bone, unspecified: Secondary | ICD-10-CM

## 2017-09-06 NOTE — Progress Notes (Signed)
Called patient about MRI results. Will order CT brain

## 2017-09-06 NOTE — Progress Notes (Signed)
Tried calling about MRI results. No answer. LMOM

## 2017-09-29 ENCOUNTER — Encounter (INDEPENDENT_AMBULATORY_CARE_PROVIDER_SITE_OTHER): Payer: Self-pay | Admitting: Nurse Practitioner

## 2017-10-03 ENCOUNTER — Telehealth (INDEPENDENT_AMBULATORY_CARE_PROVIDER_SITE_OTHER): Payer: Self-pay | Admitting: Nurse Practitioner

## 2017-10-03 NOTE — Telephone Encounter (Signed)
-----   Message from Gavin Potters, APRN sent at 10/02/2017 11:13 AM EDT -----  Regarding: FW: Referral Question  Contact: [phone]  Can we call ans switch him to be seen there?  ----- Message -----  From: Lenetta Quaker, LPN  Sent: 4/38/3818   8:35 AM  To: Gavin Potters, APRN  Subject: FW: Referral Question                            Dr. Lyndee Leo is seeing patient at the [facility] cardiology clinic.  ----- Message -----  From: Gavin Potters, APRN  Sent: 10/02/2017   7:53 AM  To: Lenetta Quaker, LPN  Subject: FW: Referral Question                            Is Dr. Lyndee Leo in [place]?  ----- Message -----  From: Exie Parody  Sent: 09/29/2017   2:29 PM  To: [facility]  Subject: Referral Question                                Hello,    I need a referral to Dr. Donzetta Sprung .Marland KitchenMarland KitchenUrologist.  I need a second referral to Dr. Lyndee Leo .Marland KitchenMarland KitchenCardiologist.      Thank you,  Elson Areas  [phone]

## 2017-10-03 NOTE — Telephone Encounter (Signed)
Patient notified of appt with Dr. Tomi Likens in [place] on 10/24/17 at 11:00am and appt with Dr. Lyndee Leo in [place] on 02/08/18 at 2:00pm.

## 2017-10-05 ENCOUNTER — Encounter (INDEPENDENT_AMBULATORY_CARE_PROVIDER_SITE_OTHER): Payer: Self-pay | Admitting: Internal Medicine

## 2017-10-05 ENCOUNTER — Ambulatory Visit (INDEPENDENT_AMBULATORY_CARE_PROVIDER_SITE_OTHER): Payer: Self-pay | Admitting: Nurse Practitioner

## 2017-10-05 ENCOUNTER — Encounter (INDEPENDENT_AMBULATORY_CARE_PROVIDER_SITE_OTHER): Payer: Self-pay | Admitting: Neurology

## 2017-10-06 ENCOUNTER — Encounter (INDEPENDENT_AMBULATORY_CARE_PROVIDER_SITE_OTHER): Payer: Self-pay | Admitting: Family Medicine

## 2017-10-06 ENCOUNTER — Ambulatory Visit: Payer: Medicare PPO | Attending: Family Medicine | Admitting: Family Medicine

## 2017-10-06 VITALS — BP 124/86 | HR 84 | Temp 98.3°F | Ht 69.0 in | Wt 191.4 lb

## 2017-10-06 DIAGNOSIS — W57XXXA Bitten or stung by nonvenomous insect and other nonvenomous arthropods, initial encounter: Secondary | ICD-10-CM | POA: Insufficient documentation

## 2017-10-06 DIAGNOSIS — A6920 Lyme disease, unspecified: Secondary | ICD-10-CM

## 2017-10-06 DIAGNOSIS — Z6828 Body mass index (BMI) 28.0-28.9, adult: Secondary | ICD-10-CM | POA: Insufficient documentation

## 2017-10-06 DIAGNOSIS — S80861A Insect bite (nonvenomous), right lower leg, initial encounter: Secondary | ICD-10-CM | POA: Insufficient documentation

## 2017-10-06 MED ORDER — DOXYCYCLINE HYCLATE 100 MG TABLET
100.00 mg | ORAL_TABLET | Freq: Two times a day (BID) | ORAL | 0 refills | Status: AC
Start: 2017-10-06 — End: 2017-10-27

## 2017-10-06 NOTE — Progress Notes (Signed)
Subjective:     Patient ID:  Richard [name] is an 57 y.o. male     Chief Complaint:    Chief Complaint   Patient presents with   . Check Up     tick bite 2 weeks ago right lower leg, area red and "feels sore" denies drainage or fever.       Patient is a pleasant 57 year old male who presents with tick bite.  Patient states he was walking [place] in [place] approximately 2 weeks ago when he noticed a tick that was attached to his right lower extremity.  It was removed approximately 2 days afterwards and was engorged.  He states that since then he has had fatigue and some myalgias.  A bull's eye rash has developed around the area of bite.  He denies any fever/chills.  Patient declines serology.  No other concerns at this time.        Past Medical History:   Diagnosis Date   . Anxiety    . BPH (benign prostatic hyperplasia)    . Congenital anomaly of kidney    . Depression    . Epilepsy (CMS [place])    . Esophageal reflux    . High blood pressure    . Hx of aortic valve replacement    . Hypercholesterolemia    . Hyperlipidemia    . Hypothyroidism    . Mass of esophagus    . Nodular prostate    . Palpitations    . Pneumonia    . Testicular hypofunction      Current Outpatient Medications   Medication Sig   . amitriptyline (ELAVIL) 10 mg Oral Tablet Take 10 mg by mouth Every night   . amLODIPine (NORVASC) 10 mg Oral Tablet Take 10 mg by mouth Once a day   . aspirin (ECOTRIN) 81 mg Oral Tablet, Delayed Release (E.C.) Take 1 Tab (81 mg total) by mouth Once a day (Patient not taking: Reported on 10/06/2017)   . calcium citrate-vitamin D3 (CITRACAL) 200 mg calcium -250 unit Oral Tablet Take by mouth Once a day   . docusate sodium (COLACE) 100 mg Oral Capsule Take 100 mg by mouth Twice daily   . doxycycline 100 mg Oral Tablet Take 1 Tab (100 mg total) by mouth Twice daily for 21 days   . fluticasone propionate (FLONASE) 50 mcg/actuation Nasal Spray, Suspension 2 Sprays by Each Nostril route Once a day   .  levETIRAcetam (KEPPRA) 500 mg Oral Tablet Take 250 mg by mouth Twice daily 1/2 tablet in the AM and 1 whole tablet in the PM    . levothyroxine (SYNTHROID) 100 mcg Oral Tablet Take 100 mcg by mouth Every morning   . losartan (COZAAR) 50 mg Oral Tablet Take 50 mg by mouth Once a day   . melatonin 10 mg Oral Tablet Take 10 mg by mouth Every night   . multivitamin Oral Tablet Take 1 Tab by mouth Once a day   . OM-3-DHA-EPA-Fish Oil-Vit D3 (FISH OIL-VIT D3) 300-1,000-1,000 mg-mg-unit Oral Capsule Take by mouth Dosage 2000, mg fish oil/1000 iu Vitamin d3   . pantoprazole (PROTONIX) 40 mg Oral Tablet, Delayed Release (E.C.) Take 40 mg by mouth Twice daily   . polyethylene glycol (MIRALAX) 17 gram/dose Oral Powder Take 17 g by mouth Once a day   . tamsulosin (FLOMAX) 0.4 mg Oral Capsule Take 0.4 mg by mouth Every evening after dinner       Review of Systems  Constitutional: Positive for fatigue.   HENT: Negative.    Eyes: Negative.    Respiratory: Negative.    Cardiovascular: Negative.    Gastrointestinal: Negative.    Endocrine: Negative.    Genitourinary: Negative.    Musculoskeletal: Positive for myalgias.   Skin: Positive for rash.   Allergic/Immunologic: Negative.    Neurological: Negative.    Hematological: Negative.    Psychiatric/Behavioral: Negative.        Objective:     Physical Exam   Constitutional: He is oriented to person, place, and time. He appears well-developed and well-nourished.   HENT:   Head: Normocephalic and atraumatic.   Right Ear: External ear normal.   Eyes: Conjunctivae and EOM are normal.   Neck: Normal range of motion. Neck supple.   Cardiovascular: Normal rate and regular rhythm.   Murmur heard.  Pulmonary/Chest: Effort normal and breath sounds normal.   Abdominal: Soft. Bowel sounds are normal.   Musculoskeletal: Normal range of motion.   Neurological: He is alert and oriented to person, place, and time.   Skin: Skin is warm and dry. Rash noted.   Psychiatric: He has a normal mood and  affect. His behavior is normal. Judgment and thought content normal.     Media Information      Document Information     PHOTOGRAPHS      10/06/2017 10:08   Attached To:   Office Visit on 10/06/17 with Yolanda Bonine, DO   Source Information     Yolanda Bonine, DO  [facility]       Ortho Exam  Vitals:    10/06/17 0925   BP: 124/86   Pulse: 84   Temp: 36.8 C (98.3 F)   Weight: 86.8 kg (191 lb 6.4 oz)   Height: 1.753 m (5\' 9" )   BMI: 28.32       Assessment & Plan:       ICD-10-CM    1. Erythema migrans (Lyme disease) A69.20 doxycycline 100 mg Oral Tablet     -likely Lyme disease.  Patient declined serology.  Doxy b.i.d. For 21 days.  Advised patient to avoid sunlight while taking medication.  Explained that symptoms could worsen briefly while on antibiotics.    The patient was given the opportunity to ask questions and those questions were answered to the patient's satisfaction. The patient was encouraged to call with any additional questions or concerns. Instructed patient to call back if symptoms worsen.     Discussed with patient effects and side effects of medications. Medication safety was discussed. A copy of the patient's medication list was printed and given to the patient. A good faith effort was made to reconcile the patient's medications.     Patient was counseled about lifestyle modification including increasing physical activity and proper diet including balanced diet and portion control. Patient also counseled about age-appropriate health maintenance issues including obtaining regular lab work, eye exams, dental exams, screening procedures/tests, vaccination, and appropriate vitamin supplementation. Also instructed to follow-up with specialists as scheduled    Yolanda Bonine, DO

## 2017-10-08 ENCOUNTER — Encounter (INDEPENDENT_AMBULATORY_CARE_PROVIDER_SITE_OTHER): Payer: Self-pay | Admitting: Family Medicine

## 2017-10-09 ENCOUNTER — Other Ambulatory Visit (INDEPENDENT_AMBULATORY_CARE_PROVIDER_SITE_OTHER): Payer: Self-pay | Admitting: Nurse Practitioner

## 2017-10-09 ENCOUNTER — Ambulatory Visit (INDEPENDENT_AMBULATORY_CARE_PROVIDER_SITE_OTHER): Payer: Self-pay | Admitting: Neurology

## 2017-10-09 ENCOUNTER — Telehealth (INDEPENDENT_AMBULATORY_CARE_PROVIDER_SITE_OTHER): Payer: Self-pay | Admitting: Neurology

## 2017-10-09 ENCOUNTER — Other Ambulatory Visit (INDEPENDENT_AMBULATORY_CARE_PROVIDER_SITE_OTHER): Payer: Self-pay | Admitting: Family Medicine

## 2017-10-09 MED ORDER — CYCLOBENZAPRINE 10 MG TABLET
10.00 mg | ORAL_TABLET | Freq: Every evening | ORAL | 0 refills | Status: DC
Start: 2017-10-09 — End: 2017-11-08

## 2017-10-09 MED ORDER — CYCLOBENZAPRINE 10 MG TABLET
10.00 mg | ORAL_TABLET | Freq: Every evening | ORAL | 0 refills | Status: DC
Start: 2017-10-09 — End: 2017-10-09

## 2017-10-09 NOTE — Telephone Encounter (Signed)
-----   Message from Abran Duke, RN sent at 10/05/2017  2:54 PM EDT -----  Regarding: FW: Non-Urgent Medical Question  Contact: [phone]      ----- Message -----  From: Exie Parody  Sent: 10/05/2017   2:41 PM  To: Neurology [place] Nurses Pool  Subject: Non-Urgent Medical Question                      Dr Adonis Housekeeper,    Please transfer the order for the CT Scan with Contrast to [facility] in [place].  I am unable to get to [facility] for this procedure. [facility] is making this request through channels, but I wanted to give you a heads up regarding their request.  I appreciate you assistance.    Thank you,  Gershon Mussel

## 2017-10-09 NOTE — Telephone Encounter (Signed)
-----   Message from Abran Duke, RN sent at 10/09/2017 12:10 PM EDT -----  Regarding: FW: [place]  ----- Message from Mayme Genta sent at 10/09/2017 12:10 PM EDT -----  Rosalin Hawking is calling to see if you got prior auth for the CT scan to be done.  Please call her.    Thanks,  Butch Penny

## 2017-10-09 NOTE — Nursing Note (Signed)
I called pts insurance and pt does not need a prior auth for scheduling his CT scan per Arianna with ref# of [id number]. However he will need to pay a 20% co insurance payment she said. I called Lauren at [phone] and made aware

## 2017-10-09 NOTE — Nursing Note (Signed)
Faxed order of CT scn to Lauren at [place]

## 2017-10-18 ENCOUNTER — Encounter (INDEPENDENT_AMBULATORY_CARE_PROVIDER_SITE_OTHER): Payer: Self-pay | Admitting: Surgical

## 2017-10-24 ENCOUNTER — Encounter (INDEPENDENT_AMBULATORY_CARE_PROVIDER_SITE_OTHER): Payer: Self-pay | Admitting: Adult Health

## 2017-10-24 ENCOUNTER — Ambulatory Visit: Payer: Medicare PPO | Attending: Adult Health

## 2017-10-24 ENCOUNTER — Ambulatory Visit (INDEPENDENT_AMBULATORY_CARE_PROVIDER_SITE_OTHER): Payer: Medicare PPO | Admitting: Rheumatology

## 2017-10-24 ENCOUNTER — Encounter (INDEPENDENT_AMBULATORY_CARE_PROVIDER_SITE_OTHER): Payer: Self-pay

## 2017-10-24 VITALS — BP 120/80 | HR 92 | Temp 97.0°F | Ht 69.0 in | Wt 194.0 lb

## 2017-10-24 DIAGNOSIS — R569 Unspecified convulsions: Secondary | ICD-10-CM | POA: Insufficient documentation

## 2017-10-24 DIAGNOSIS — Z79899 Other long term (current) drug therapy: Secondary | ICD-10-CM | POA: Insufficient documentation

## 2017-10-24 DIAGNOSIS — N529 Male erectile dysfunction, unspecified: Secondary | ICD-10-CM | POA: Insufficient documentation

## 2017-10-24 DIAGNOSIS — Q631 Lobulated, fused and horseshoe kidney: Secondary | ICD-10-CM

## 2017-10-24 DIAGNOSIS — N281 Cyst of kidney, acquired: Secondary | ICD-10-CM | POA: Insufficient documentation

## 2017-10-24 DIAGNOSIS — Z125 Encounter for screening for malignant neoplasm of prostate: Secondary | ICD-10-CM | POA: Insufficient documentation

## 2017-10-24 DIAGNOSIS — R399 Unspecified symptoms and signs involving the genitourinary system: Secondary | ICD-10-CM | POA: Insufficient documentation

## 2017-10-24 LAB — PSA SCREENING: PSA: 0.1 ng/mL (ref <=4.0)

## 2017-10-24 NOTE — H&P (Signed)
[facility]  OF UROLOGY  HISTORY & PHYSICAL          NAME :Richard [name]  AGE: 57 y.o.  DATE: 10/24/2017  SERVICE: Urology        Chief Complaint   Patient presents with   . Other     BPH       HPI: Richard [name] is a 57 y.o. male new patient visit today.  He is a self referral today for establishment of care.  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  He is here today for consultation and status will care for lower urinary tract symptoms, and erectile dysfunction.  Regarding lower urinary tract symptoms, he is stable on Flomax 0.4 mg at HS.  He has no bothersome nocturia or frequency / urgency.  History of a TURP in remote past that has unfortunately caused him some erectile dysfunction.   He apparently has been on Cialis and/or Viagra in the past and tolerated well, though I have no written record of this.  HE reports that he can get a "spontaneous erection but cannot maintain".  He is interested in treatment options.  HE denies any chest pain, shortness of breath, unexplained weight loss.  No family or personal history of prostate, bladder or renal cancer.  NO hematuria, dysuria, urinary incontinence.          Past Medical History  Past Medical History:   Diagnosis Date   . Anxiety    . BPH (benign prostatic hyperplasia)    . Congenital anomaly of kidney    . Depression    . Epilepsy (CMS [place])    . Esophageal reflux    . High blood pressure    . Hx of aortic valve replacement    . Hypercholesterolemia    . Hyperlipidemia    . Hypothyroidism    . Mass of esophagus    . Nodular prostate    . Palpitations    . Pneumonia    . Testicular hypofunction              Past Surgical History  Past Surgical History:   Procedure Laterality Date   . COLONOSCOPY     . HX AORTIC VALVE REPLACEMENT  2009   . HX  COLECTOMY     . HX TURP     . HX UPPER ENDOSCOPY  04/05/2016   . INCISIONAL HERNIA REPAIR     . VENTRAL HERNIA REPAIR               Allergies  Allergies   Allergen Reactions   . Statins-Hmg-Coa Reductase Inhibitors Nausea/ Vomiting         Medications    Outpatient Medications Prior to Visit:  amitriptyline (ELAVIL) 10 mg Oral Tablet Take 10 mg by mouth Every night   amLODIPine (NORVASC) 10 mg Oral Tablet Take 10 mg by mouth Once a day   aspirin (ECOTRIN) 81 mg Oral Tablet, Delayed Release (E.C.) Take 1 Tab (81 mg total) by mouth Once a day (Patient not taking: Reported on 10/06/2017)   calcium citrate-vitamin D3 (CITRACAL) 200 mg calcium -250 unit Oral Tablet Take by mouth Once a day   cyclobenzaprine (FLEXERIL) 10 mg Oral Tablet TAKE 1 TABLET BY MOUTH ONCE EVERY NIGHT   cyclobenzaprine (FLEXERIL) 10 mg Oral Tablet Take 1 Tab (10  mg total) by mouth Every night for 90 days   docusate sodium (COLACE) 100 mg Oral Capsule Take 100 mg by mouth Twice daily   doxycycline 100 mg Oral Tablet Take 1 Tab (100 mg total) by mouth Twice daily for 21 days   fluticasone propionate (FLONASE) 50 mcg/actuation Nasal Spray, Suspension 2 Sprays by Each Nostril route Once a day   levETIRAcetam (KEPPRA) 500 mg Oral Tablet Take 250 mg by mouth Twice daily 1/2 tablet in the AM and 1 whole tablet in the PM    levothyroxine (SYNTHROID) 100 mcg Oral Tablet Take 100 mcg by mouth Every morning   losartan (COZAAR) 50 mg Oral Tablet Take 50 mg by mouth Once a day   melatonin 10 mg Oral Tablet Take 10 mg by mouth Every night   multivitamin Oral Tablet Take 1 Tab by mouth Once a day   OM-3-DHA-EPA-Fish Oil-Vit D3 (FISH OIL-VIT D3) 300-1,000-1,000 mg-mg-unit Oral Capsule Take by mouth Dosage 2000, mg fish oil/1000 iu Vitamin d3   pantoprazole (PROTONIX) 40 mg Oral Tablet, Delayed Release (E.C.) Take 40 mg by mouth Twice daily   polyethylene glycol (MIRALAX) 17 gram/dose Oral Powder Take 17 g by mouth Once a day   tamsulosin (FLOMAX) 0.4 mg Oral  Capsule Take 0.4 mg by mouth Every evening after dinner     No facility-administered medications prior to visit.         Social History   Social History     Socioeconomic History   . Marital status: Married     Spouse name: Not on file   . Number of children: Not on file   . Years of education: Not on file   . Highest education level: Not on file   Occupational History   . Occupation: disabled    Social Needs   . Financial resource strain: Not on file   . Food insecurity:     Worry: Not on file     Inability: Not on file   . Transportation needs:     Medical: Not on file     Non-medical: Not on file   Tobacco Use   . Smoking status: Never Smoker   . Smokeless tobacco: Never Used   Substance and Sexual Activity   . Alcohol use: Yes     Alcohol/week: 1.0 standard drinks     Types: 1 Glasses of wine per week     Binge frequency: Less than monthly   . Drug use: Not on file   . Sexual activity: Not on file   Lifestyle   . Physical activity:     Days per week: Not on file     Minutes per session: Not on file   . Stress: Not on file   Relationships   . Social connections:     Talks on phone: Not on file     Gets together: Not on file     Attends religious service: Not on file     Active member of club or organization: Not on file     Attends meetings of clubs or organizations: Not on file     Relationship status: Not on file   . Intimate partner violence:     Fear of current or ex partner: Not on file     Emotionally abused: Not on file     Physically abused: Not on file     Forced sexual activity: Not on file   Other Topics Concern   . Not  on file   Social History Narrative   . Not on file         Family History  Family Medical History:     Problem Relation (Age of Onset)    Blood Clots Paternal Grandmother    Coronary Artery Disease Mother, Father    Epilepsy Sister    Heart Attack Father    High Cholesterol Mother, Father    Hypertension (High Blood Pressure) Sister, Sister    Stroke Mother    Thyroid Disease Mother                  ROS:      General/Constitutional-Neg.    Skin/Breast-Neg.     Eyes/Ears/Nose/Mouth/Throat-Neg.    Cardiovascular-Neg.    Respiratory -Neg.    Gastrointestinal-Neg.    Genitourinary -See HPI    Hematologic -Neg.    Endocrine -Neg.    Musculoskeletal -Neg.    Neurologic-Neg.    Psychiatric -Neg.    Allergic/Immunologic -Neg.              Vital Signs:   BP 120/80   Pulse 92   Temp 36.1 C (97 F) (Thermal Scan)   Ht 1.753 m (5\' 9" )   Wt 88 kg (194 lb 0.1 oz)   SpO2 99% Comment: room air  BMI 28.65 kg/m         OBJECTIVE:  PHYSICAL EXAM:    General:  Pt is vitally stable (see values). Appears calm and at rest. Dressed appropriately.   Skin is warm and dry.    HEENT:  Eyes are clear.   Respiratory effort is unlabored.     Psych: Pt is alert and in no acute distress.      MS:  Pt is able to ambulate independelty  with active ROM in all extremities.        Neuro:  Neurological exam grossly intact   GI: The abdomen is soft, nontender and nondistended. No CVAT   GU:  Genital exam   Phallus is normal unremarkable.  There are no lesions masses or abnormalities.  Meatus is patent midline.  Testicles descended bilaterally and of normal lie.  NO noted lesions or masses.  Digital rectal exam reveals a 20 g smooth soft and symmetrical prostate with no lesions or masses..        The 10-year ASCVD risk score Mikey Bussing DC Brooke Bonito., et al., 2013) is: 8.4%    Values used to calculate the score:      Age: 45 years      Sex: Male      Is Non-Hispanic African American: No      Diabetic: No      Tobacco smoker: No      Systolic Blood Pressure: 762 mmHg      Is BP treated: Yes      HDL Cholesterol: 40 mg/dL      Total Cholesterol: 185 mg/dL         ASSESSMENT: Richard [name] is a 57 y.o. male seen today as a new patient for:  1: LUTs  2: ED          PLAN:     Orders Placed This Encounter   . US KIDNEY   . Psa Screening       1:   Regarding lower urinary tract symptoms - stable on Flomax daily, recommend he maintain this.  All  risks explained.  PAtient understood.  Regarding his erectile dysfunction -I am happy to start  him on medications but would request that he see his neurologist prior to initiation to ensure that this will not increase his likelihood for return of seizures.  As it has been several years since his last seizure I feel that this is a reasonable request and will await his neurologists recommendations.  Patient is agreeable.  I have ordered PSA today for screening purposes and renal ultrasound to review his kidneys and this "kidney cyst".  I have discussed with patient that I can follow up with these results with him over the phone and he agrees.  He will work on obtaining his urology records from outside the state and will have them faxed to [place].  We will see him back in 6 months to review his overall health, sooner should PSA or renal ultrasound be of concern.      Patient seen independently with co-signing physician present in clinic.       Edythe Clarity, APRN,AGPCNP-BC  10/24/2017, 11:03    J. Ranelle Oyster, APRN, AGNP-C   [facility]  Department of Urology  [phone]  Fax- [phone]  Pager- [phone]

## 2017-11-03 DIAGNOSIS — R079 Chest pain, unspecified: Secondary | ICD-10-CM

## 2017-11-03 DIAGNOSIS — I340 Nonrheumatic mitral (valve) insufficiency: Secondary | ICD-10-CM

## 2017-11-03 DIAGNOSIS — R0609 Other forms of dyspnea: Secondary | ICD-10-CM

## 2017-11-08 ENCOUNTER — Ambulatory Visit: Payer: Medicare PPO | Attending: Family Medicine | Admitting: Family Medicine

## 2017-11-08 ENCOUNTER — Encounter (INDEPENDENT_AMBULATORY_CARE_PROVIDER_SITE_OTHER): Payer: Self-pay | Admitting: Family Medicine

## 2017-11-08 VITALS — BP 134/90 | HR 72 | Temp 98.8°F | Ht 69.0 in | Wt 197.6 lb

## 2017-11-08 DIAGNOSIS — A6920 Lyme disease, unspecified: Secondary | ICD-10-CM | POA: Insufficient documentation

## 2017-11-08 DIAGNOSIS — Z6829 Body mass index (BMI) 29.0-29.9, adult: Secondary | ICD-10-CM | POA: Insufficient documentation

## 2017-11-08 DIAGNOSIS — I10 Essential (primary) hypertension: Secondary | ICD-10-CM | POA: Insufficient documentation

## 2017-11-08 NOTE — Progress Notes (Signed)
Subjective:     Patient ID:  Richard [name] is an 57 y.o. male     Chief Complaint:    Chief Complaint   Patient presents with   . Check Up     1 month follow up       Patient is a pleasant 57 year old male presents for follow-up to erythema migrans.  Patient states he has not had any fever/chills, myalgias, arthralgias, or other concerning symptoms.  Rash is completely resolved.  Blood pressure controlled with current medications.        Past Medical History:   Diagnosis Date   . Anxiety    . BPH (benign prostatic hyperplasia)    . Congenital anomaly of kidney    . Depression    . Epilepsy (CMS [place])    . Esophageal reflux    . High blood pressure    . Hx of aortic valve replacement    . Hypercholesterolemia    . Hyperlipidemia    . Hypothyroidism    . Mass of esophagus    . Nodular prostate    . Palpitations    . Pneumonia    . Testicular hypofunction      Current Outpatient Medications   Medication Sig   . amitriptyline (ELAVIL) 10 mg Oral Tablet Take 10 mg by mouth Every night   . amLODIPine (NORVASC) 10 mg Oral Tablet Take 10 mg by mouth Once a day   . calcium citrate-vitamin D3 (CITRACAL) 200 mg calcium -250 unit Oral Tablet Take by mouth Once a day   . cyclobenzaprine (FLEXERIL) 10 mg Oral Tablet TAKE 1 TABLET BY MOUTH ONCE EVERY NIGHT   . docusate sodium (COLACE) 100 mg Oral Capsule Take 100 mg by mouth Twice daily   . ergocalciferol, vitamin D2, (VITAMIN D2 ORAL) Take 1 Tab by mouth Once a day   . fluticasone propionate (FLONASE) 50 mcg/actuation Nasal Spray, Suspension 2 Sprays by Each Nostril route Once a day   . levETIRAcetam (KEPPRA) 500 mg Oral Tablet Take 250 mg by mouth Twice daily    . levothyroxine (SYNTHROID) 100 mcg Oral Tablet Take 100 mcg by mouth Every morning   . losartan (COZAAR) 50 mg Oral Tablet Take 50 mg by mouth Once a day   . melatonin 10 mg Oral Tablet Take 10 mg by mouth Every night   . multivitamin Oral Tablet Take 1 Tab by mouth Once a day   . OM-3-DHA-EPA-Fish Oil-Vit D3 (FISH  OIL-VIT D3) 300-1,000-1,000 mg-mg-unit Oral Capsule Take 3 Caps by mouth Once a day Dosage 2000, mg fish oil/1000 iu Vitamin d3    . pantoprazole (PROTONIX) 40 mg Oral Tablet, Delayed Release (E.C.) Take 40 mg by mouth Twice daily   . polyethylene glycol (MIRALAX) 17 gram/dose Oral Powder Take 17 g by mouth Once a day       Review of Systems   Constitutional: Negative.    HENT: Negative.    Eyes: Negative.    Respiratory: Negative.    Cardiovascular: Negative.    Gastrointestinal: Negative.    Endocrine: Negative.    Genitourinary: Negative.    Musculoskeletal: Negative.    Skin: Negative.    Neurological: Negative.    Hematological: Negative.    Psychiatric/Behavioral: Negative.        Objective:     Physical Exam   Constitutional: He is oriented to person, place, and time. He appears well-developed and well-nourished.   HENT:   Head: Normocephalic and atraumatic.   Right  Ear: External ear normal.   Left Ear: External ear normal.   Eyes: Conjunctivae and EOM are normal.   Neck: Normal range of motion. Neck supple.   Cardiovascular: Normal rate, regular rhythm and normal heart sounds.   Pulmonary/Chest: Effort normal and breath sounds normal.   Abdominal: Soft. Bowel sounds are normal.   Musculoskeletal: Normal range of motion.   Neurological: He is alert and oriented to person, place, and time.   Skin: Skin is warm and dry.   Psychiatric: He has a normal mood and affect. His behavior is normal. Judgment and thought content normal.       Ortho Exam  Vitals:    11/08/17 1256   BP: 134/90   Pulse: 72   Temp: 37.1 C (98.8 F)   Weight: 89.6 kg (197 lb 9.6 oz)   Height: 1.753 m (5\' 9" )   BMI: 29.24       Assessment & Plan:       ICD-10-CM    1. Erythema migrans (Lyme disease) A69.20    2. Essential hypertension I10    -rash is gone.  Patient denies symptoms such as fevers/chills, myalgias, or diarrhea from antibiotic.  Blood pressure controlled.      The patient was given the opportunity to ask questions and those  questions were answered to the patient's satisfaction. The patient was encouraged to call with any additional questions or concerns. Instructed patient to call back if symptoms worsen.     Discussed with patient effects and side effects of medications. Medication safety was discussed. A copy of the patient's medication list was printed and given to the patient. A good faith effort was made to reconcile the patient's medications.     Patient was counseled about lifestyle modification including increasing physical activity and proper diet including balanced diet and portion control. Patient also counseled about age-appropriate health maintenance issues including obtaining regular lab work, eye exams, dental exams, screening procedures/tests, vaccination, and appropriate vitamin supplementation. Also instructed to follow-up with specialists as scheduled    Yolanda Bonine, DO

## 2017-11-10 ENCOUNTER — Telehealth (INDEPENDENT_AMBULATORY_CARE_PROVIDER_SITE_OTHER): Payer: Self-pay | Admitting: Internal Medicine

## 2017-11-10 ENCOUNTER — Other Ambulatory Visit (INDEPENDENT_AMBULATORY_CARE_PROVIDER_SITE_OTHER): Payer: Self-pay

## 2017-11-10 ENCOUNTER — Telehealth (HOSPITAL_COMMUNITY): Payer: Self-pay | Admitting: Internal Medicine

## 2017-11-10 DIAGNOSIS — Z952 Presence of prosthetic heart valve: Secondary | ICD-10-CM

## 2017-11-10 DIAGNOSIS — Z9289 Personal history of other medical treatment: Secondary | ICD-10-CM

## 2017-11-10 DIAGNOSIS — R079 Chest pain, unspecified: Secondary | ICD-10-CM

## 2017-11-10 DIAGNOSIS — R0609 Other forms of dyspnea: Secondary | ICD-10-CM

## 2017-11-10 NOTE — Telephone Encounter (Signed)
I have called and spoken with patient's wife.  I have reviewed patient's TTE 10/2017.  This was performed at [facility], [place].  LVEF 65-70%, LVH.  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  This will have to be followed per serial echocardiograms in future.      Cathie Hoops, M.D.  Assistant Professor of Medicine, Section of Cardiology  [facility]

## 2017-11-10 NOTE — Telephone Encounter (Signed)
I have reviewed patient's Lexiscan MPS 10/2017. This was done at [facility], [place], [place].  This showed no evidence of ischemia, EF 70%.      Cathie Hoops, M.D.  Assistant Professor of Medicine, Section of Cardiology  [facility]

## 2017-11-10 NOTE — Telephone Encounter (Signed)
Attempted to notify patient of result of MPS obtained at [facility].  Detailed message left that test was normal.  Encouraged patient to return call with any questions.    PLAN: MPS normal    Echo pending   Foye Deer, RN  11/10/2017, 10:54      History of stress test   Noted:  11/10/2017   Overview Signed 11/10/2017 10:06 AM by Domenic Moras, MD   Lexiscan MPS 10/2017 @ [facility], [place] - No evidence of ischemia, EF 70%.

## 2017-11-15 ENCOUNTER — Other Ambulatory Visit (INDEPENDENT_AMBULATORY_CARE_PROVIDER_SITE_OTHER): Payer: Self-pay | Admitting: Surgical

## 2017-11-15 ENCOUNTER — Ambulatory Visit: Payer: Medicare PPO | Attending: Surgical | Admitting: Surgical

## 2017-11-15 ENCOUNTER — Encounter (INDEPENDENT_AMBULATORY_CARE_PROVIDER_SITE_OTHER): Payer: Self-pay | Admitting: Surgical

## 2017-11-15 VITALS — BP 110/80 | HR 80 | Temp 98.4°F | Resp 16 | Ht 69.0 in | Wt 196.8 lb

## 2017-11-15 DIAGNOSIS — E039 Hypothyroidism, unspecified: Secondary | ICD-10-CM

## 2017-11-15 DIAGNOSIS — N529 Male erectile dysfunction, unspecified: Secondary | ICD-10-CM

## 2017-11-15 DIAGNOSIS — Z0000 Encounter for general adult medical examination without abnormal findings: Principal | ICD-10-CM | POA: Insufficient documentation

## 2017-11-15 DIAGNOSIS — Z6829 Body mass index (BMI) 29.0-29.9, adult: Secondary | ICD-10-CM | POA: Insufficient documentation

## 2017-11-15 DIAGNOSIS — E785 Hyperlipidemia, unspecified: Secondary | ICD-10-CM

## 2017-11-15 DIAGNOSIS — R5383 Other fatigue: Secondary | ICD-10-CM

## 2017-11-15 NOTE — Nursing Note (Cosign Needed)
Patient requested that Testosterone level be checked as he is having ED. Lyndel Safe, PA-C  11/15/2017, 15:14

## 2017-11-15 NOTE — Nursing Note (Signed)
11/15/17 1300   Medicare Wellness Assessment   Medicare initial or wellness physical in the last year? No   Advance Directives   Does patient have a living will or MPOA no   Has patient provided [employer] with a copy? no   Advance directive information given to the patient today? no   Activities of Daily Living   Do you need help with dressing, bathing, or walking? No   Do you need help with shopping, housekeeping, medications, or finances? No   Do you have rugs in hallways, broken steps, or poor lighting? No   Do you have grab bars in your bathroom, non-slip strips in your tub, and hand rails on your stairs? No   Cognitive Function Screen   What is you age? 0   What is the time to the nearest hour? 1   What is the year? 1   What is the name of this clinic? 1   Can the patient recognize two persons (the doctor, the nurse, home help, etc.)? 1   What is the date of your birth? (day and month sufficient)  1   In what year did World War II end? 1   Who is the current president of the [place]? 0   Count from 20 down to 1? 1   What address did I give you earlier? 0   Total Score 7   Depression Screen   Little interest or pleasure in doing things. 0   Feeling down, depressed, or hopeless 0   PHQ 2 Total 0   Hearing Screen   Have you noticed any hearing difficulties? No   After whispering 9-1-6 how many numbers did the patient repeat correctly? 3   Fall Risk Assessment   Do you feel unsteady when standing or walking? No   Do you worry about falling? No   Have you fallen in the past year? No

## 2017-11-15 NOTE — Patient Instructions (Signed)
Medicare Preventive Services  Medicare coverage information Recommendation for YOU   Heart Disease and Diabetes   Lipid profile every 5 years or more often if at risk for cardiovascular disease  Last Lipid Panel  (Last result in the past 2 years)      Cholesterol   HDL   LDL   Direct LDL   Triglycerides      07/10/17 0828 185 40 103   209         Diabetes Screening with Blood Glucose test or Glucose Tolerance Test  yearly for those at risk for diabetes, up to two tests per year for those with prediabetes  Last Glucose: 90     Diabetes Self-Management Training initial training ten hours per year, and follow-up training two hours per subsequent year. Optional for those with diabetes    Medical Nutrition Therapy three hours of one-on-one counseling in first year, two hours in subsequent years. Optional for those with diabetes, kidney disease   Intensive Behavioral Therapy for Obesity  Face-to-face counseling, first month every week, month 2-6 every other week, month 7-12 every month if continued progress is documented Optional for those with Body Mass Index 30 or higher  Your Body mass index is 29.06 kg/m.   Tobacco Cessation (Quitting) Counseling   Two attempts per year, max 4 sessions per attempt, up to 8 sessions per year Optional for those who use tobacco    Cancer Screening   Colorectal screening   for anyone age 32 to 44 or any age if high risk:  . Screening Colonoscopy every 10 years, more frequent if high risk  OR  . Flexible  Sigmoidoscopy  every 5 years OR  . Fecal Occult Blood Testing yearly OR  . Cologuard Stool DNA test once every 3 years OR  . CT Colonography every 5 years  See Your Schedule below   Prostate Cancer Screening  Prostate Specific Antigen and Digital Rectal Exam NOT routinely recommended  Medicare will still cover annually after age 63 if recommended by your provider      Lung Cancer Screening  Annual low dose computed tomography (LDCT scan) is recommended for those age 63-77 who smoked 30  pack-years and are current smokers or quit smoking within past 15 years (one pack-year= smoking one PPD for one year), after counseling by your doctor or nurse clinician about the possible benefits or harms. See Your Schedule below   Vaccinations   Pneumococcal Vaccine recommended routinely age 54+ with two separate vaccines one year apart (Prevnar then Pneumovax).  Recommended before age 30 if medical conditions increase risk  Seasonal Influenza Vaccine once every flu season   Hepatitis B Vaccine 3 doses if risk (including anyone with diabetes or liver disease)  Shingles Vaccine Once or twice age 20 or older  Diphtheria Tetanus Pertussis Vaccine ONCE as adult and a booster every 10 years      There is no immunization history on file for this patient.  Shingles vaccine and Diphtheria Tetanus Pertussis vaccines are available at pharmacies or local health department without a prescription.   Other Screening   Glaucoma Screening   yearly if in high risk group such as diabetes, family history, African American age 61+ or Hispanic American age 63+    Hepatitis C Screening recommended ONCE for those born between 1945-1965, or high risk for HCV infection  See Your Schedule below   HIV Testing recommended routinely at least ONCE, covered every year for age 60 to 66 regardless  of risk, and every year for age over 42 who ask for the test or higher risk See Your Schedule below  Abdominal Aortic Aneurysm Screening Ultrasound Recommended ONCE for any male who smoked 100 cigarettes/lifetime OR with family history of aortic aneurysm     Your Personalized Schedule for Preventive Tests   Health Maintenance: Pending and Last Completed       Date Due Completion Date    HIV Screening 08/20/1974 ---    Adult Tdap-Td (1 - Tdap) 08/20/1978 ---    Hepatitis C screening antibody 08/19/2004 ---    Shingles Vaccine (1 of 2) 08/19/2009 ---    Annual Wellness Exam 06/05/2017 ---    Influenza Vaccine (1) 11/05/2017 ---    Depression Screening  06/06/2018 06/05/2017    Colonoscopy 12/15/2025 12/16/2015

## 2017-11-15 NOTE — Progress Notes (Signed)
INTERNAL MEDICINE, [facility]  [place] [address]    Medicare Annual Wellness Visit    Name: Richard [name] MRN:  [id number]   Date: 11/15/2017 Age: 57 y.o.       SUBJECTIVE:   Richard [name] is a 57 y.o. male for presenting for Medicare Wellness exam.     Patient states that he is doing well; no acute complaints at this time. He continues to follow with various specialists.     Cardiology- Dr. Lyndee Leo  Neurology- Dr. Kimber Relic  Urology- Dr. Tomi Likens    I have reviewed and reconciled the medication list with the patient today.    I have reviewed and updated as appropriate the past medical, family and social history. 11/15/2017 as summarized below:  Past Medical History:   Diagnosis Date   . Anxiety    . BPH (benign prostatic hyperplasia)    . Congenital anomaly of kidney    . Depression    . Epilepsy (CMS [place])    . Esophageal reflux    . High blood pressure    . Hx of aortic valve replacement    . Hypercholesterolemia    . Hyperlipidemia    . Hypothyroidism    . Mass of esophagus    . Nodular prostate    . Palpitations    . Pneumonia    . Testicular hypofunction      Past Surgical History:   Procedure Laterality Date   . Colonoscopy     . Hx aortic valve replacement  2009   . Hx colectomy     . Hx turp     . Hx upper endoscopy  04/05/2016   . Incisional hernia repair     . Ventral hernia repair       Current Outpatient Medications   Medication Sig   . amitriptyline (ELAVIL) 10 mg Oral Tablet Take 10 mg by mouth Every night   . amLODIPine (NORVASC) 10 mg Oral Tablet Take 10 mg by mouth Once a day   . calcium citrate-vitamin D3 (CITRACAL) 200 mg calcium -250 unit Oral Tablet Take by mouth Once a day   . cyclobenzaprine (FLEXERIL) 10 mg Oral Tablet TAKE 1 TABLET BY MOUTH ONCE EVERY NIGHT   . docusate sodium (COLACE) 100 mg Oral Capsule Take 100 mg by mouth Twice daily   . ergocalciferol, vitamin D2, (VITAMIN D2 ORAL) Take 1 Tab by mouth Once a day   . fluticasone  propionate (FLONASE) 50 mcg/actuation Nasal Spray, Suspension 2 Sprays by Each Nostril route Once a day   . levETIRAcetam (KEPPRA) 500 mg Oral Tablet Take 250 mg by mouth Twice daily    . levothyroxine (SYNTHROID) 100 mcg Oral Tablet Take 100 mcg by mouth Every morning   . losartan (COZAAR) 50 mg Oral Tablet Take 50 mg by mouth Once a day   . melatonin 10 mg Oral Tablet Take 10 mg by mouth Every night   . multivitamin Oral Tablet Take 1 Tab by mouth Once a day   . OM-3-DHA-EPA-Fish Oil-Vit D3 (FISH OIL-VIT D3) 300-1,000-1,000 mg-mg-unit Oral Capsule Take 3 Caps by mouth Once a day Dosage 2000, mg fish oil/1000 iu Vitamin d3    . pantoprazole (PROTONIX) 40 mg Oral Tablet, Delayed Release (E.C.) Take 40 mg by mouth Twice daily   . polyethylene glycol (MIRALAX) 17 gram/dose Oral Powder Take 17 g by mouth Once a day   . tamsulosin (FLOMAX) 0.4 mg Oral  Capsule Take 0.4 mg by mouth Every evening after dinner     Family Medical History:     Problem Relation (Age of Onset)    Blood Clots Paternal Grandmother    Coronary Artery Disease Mother, Father    Epilepsy Sister    Heart Attack Father    High Cholesterol Mother, Father    Hypertension (High Blood Pressure) Sister, Sister    Stroke Mother    Thyroid Disease Mother            Social History     Socioeconomic History   . Marital status: Married     Spouse name: Not on file   . Number of children: Not on file   . Years of education: Not on file   . Highest education level: Not on file   Occupational History   . Occupation: disabled    Tobacco Use   . Smoking status: Never Smoker   . Smokeless tobacco: Never Used   Substance and Sexual Activity   . Alcohol use: Yes     Alcohol/week: 1.0 standard drinks     Types: 1 Glasses of wine per week     Binge frequency: Less than monthly     Review of Systems: Pertinent items are noted in HPI.     List of Current Health Care Providers   Care Team     PCP     Name Type Specialty Phone Number    Gavin Potters, APRN Nurse Practitioner  NURSE PRACTITIONER [phone]          Care Team     No care team found                  Health Maintenance   Topic Date Due   . HIV Screening  08/20/1974   . Adult Tdap-Td (1 - Tdap) 08/20/1978   . Hepatitis C screening antibody  08/19/2004   . Shingles Vaccine (1 of 2) 08/19/2009   . Annual Wellness Exam  06/05/2017   . Influenza Vaccine (1) 11/05/2017   . Depression Screening  06/06/2018   . Colonoscopy  12/15/2025     Medicare Wellness Assessment   Medicare initial or wellness physical in the last year?: No  Advance Directives (optional)   Does patient have a living will or MPOA: no   Has patient provided [employer] with a copy?: no   Advance directive information given to the patient today?: no      Activities of Daily Living   Do you need help with dressing, bathing, or walking?: No   Do you need help with shopping, housekeeping, medications, or finances?: No   Do you have rugs in hallways, broken steps, or poor lighting?: No   Do you have grab bars in your bathroom, non-slip strips in your tub, and hand rails on your stairs?: No   Urinary Incontinence Screen (Women >=65 only)       Cognitive Function Screen (1=Yes, 0=No)   What is you age?: Incorrect   What is the time to the nearest hour?: Correct   What is the year?: Correct   What is the name of this clinic?: Correct   Can the patient recognize two persons (the doctor, the nurse, home help, etc.)?: Correct   What is the date of your birth? (day and month sufficient) : Correct   In what year did World War II end?: Correct   Who is the current president of the [place]?: Incorrect  Count from 20 down to 1?: Correct   What address did I give you earlier?: Incorrect   Total Score: 7       Hearing Screen   Have you noticed any hearing difficulties?: No  After whispering 9-1-6 how many numbers did the patient repeat correctly?: 3   Fall Risk Screen   Do you feel unsteady when standing or walking?: No  Do you worry about falling?: No  Have you fallen  in the past year?: No   Vision Screen           Depression Screen   Little interest or pleasure in doing things.: Not at all  Feeling down, depressed, or hopeless: Not at all  PHQ 2 Total: 0        OBJECTIVE:   BP 110/80   Pulse 80   Temp 36.9 C (98.4 F)   Resp 16   Ht 1.753 m (5\' 9" )   Wt 89.3 kg (196 lb 12.8 oz)   SpO2 94%   BMI 29.06 kg/m        Other appropriate exam:  Physical Exam   Constitutional: He is oriented to person, place, and time and well-developed, well-nourished, and in no distress.   HENT:   Head: Normocephalic and atraumatic.   Right Ear: External ear normal.   Left Ear: External ear normal.   Mouth/Throat: Oropharynx is clear and moist.   Eyes: Pupils are equal, round, and reactive to light. Conjunctivae and EOM are normal.   Neck: Normal range of motion. Neck supple.   Cardiovascular: Normal rate and regular rhythm.   + Murmur   Pulmonary/Chest: Effort normal and breath sounds normal.   Abdominal: Soft.   Musculoskeletal: Normal range of motion.   Neurological: He is alert and oriented to person, place, and time.   Skin: Skin is warm and dry.   Psychiatric: Mood, memory, affect and judgment normal.           Health Maintenance Due   Topic Date Due   . HIV Screening  08/20/1974   . Adult Tdap-Td (1 - Tdap) 08/20/1978   . Hepatitis C screening antibody  08/19/2004   . Shingles Vaccine (1 of 2) 08/19/2009   . Annual Wellness Exam  06/05/2017   . Influenza Vaccine (1) 11/05/2017      ASSESSMENT & PLAN:    Identified Risk Factors/ Recommended Actions     Health maintenance: He states up to date on immunizations- received through outside provider in [place]. Advised for patient to continue to follow with specialists as scheduled- will defer to them.     Order placed for labs.            The patient has been educated about risk factors and recommended preventive care. Written Prevention Plan completed/ updated and given to patient (see After Visit Summary).    Return in about 6 months (around  05/16/2018).     Patient was seen independently with co-signing physician available for consult.     Lyndel Safe, PA-C  [facility], [facility]  [place]  [place] [id number]    I agree with above plan. Any exceptions are noted above.    Yolanda Bonine, DO

## 2017-11-20 ENCOUNTER — Encounter (INDEPENDENT_AMBULATORY_CARE_PROVIDER_SITE_OTHER): Payer: Self-pay | Admitting: Family Medicine

## 2017-11-21 ENCOUNTER — Encounter (INDEPENDENT_AMBULATORY_CARE_PROVIDER_SITE_OTHER): Payer: Self-pay | Admitting: Surgical

## 2017-11-22 ENCOUNTER — Ambulatory Visit: Payer: Medicare PPO | Attending: Surgical

## 2017-11-22 ENCOUNTER — Other Ambulatory Visit: Payer: Self-pay

## 2017-11-22 ENCOUNTER — Other Ambulatory Visit (INDEPENDENT_AMBULATORY_CARE_PROVIDER_SITE_OTHER): Payer: Self-pay | Admitting: Surgical

## 2017-11-22 DIAGNOSIS — M542 Cervicalgia: Secondary | ICD-10-CM

## 2017-11-22 DIAGNOSIS — R5383 Other fatigue: Secondary | ICD-10-CM | POA: Insufficient documentation

## 2017-11-22 DIAGNOSIS — N529 Male erectile dysfunction, unspecified: Secondary | ICD-10-CM | POA: Insufficient documentation

## 2017-11-22 DIAGNOSIS — E785 Hyperlipidemia, unspecified: Secondary | ICD-10-CM

## 2017-11-22 DIAGNOSIS — E039 Hypothyroidism, unspecified: Secondary | ICD-10-CM | POA: Insufficient documentation

## 2017-11-22 DIAGNOSIS — Z0000 Encounter for general adult medical examination without abnormal findings: Secondary | ICD-10-CM | POA: Insufficient documentation

## 2017-11-22 LAB — LIPID PANEL
CHOL/HDL RATIO: 4.3 (ref 0.0–5.0)
CHOLESTEROL: 175 mg/dL (ref 0–199)
HDL CHOL: 41 mg/dL (ref 29–71)
LDL CALC: 107 mg/dL — ABNORMAL HIGH (ref 0–100)
TRIGLYCERIDES: 136 mg/dL (ref 0–149)
VLDL CALC: 27 mg/dL (ref 6–49)

## 2017-11-22 LAB — COMPREHENSIVE METABOLIC PNL, FASTING
ALBUMIN/GLOBULIN RATIO: 1.2 (ref 1.1–2.2)
ALBUMIN: 4.4 g/dL (ref 3.5–5.0)
ALKALINE PHOSPHATASE: 108 U/L (ref 38–126)
ALT (SGPT): 29 U/L (ref 17–63)
ANION GAP: 9 mmol/L (ref 5–15)
AST (SGOT): 29 U/L (ref 15–41)
BILIRUBIN TOTAL: 1 mg/dL (ref 0.3–1.2)
BUN/CREA RATIO: 14 (ref 6–20)
BUN: 17 mg/dL (ref 8–26)
CALCIUM: 9.5 mg/dL (ref 8.9–10.3)
CHLORIDE: 105 mmol/L (ref 101–111)
CO2 TOTAL: 25 mmol/L (ref 22–32)
CREATININE: 1.2 mg/dL (ref 0.90–1.30)
ESTIMATED GFR: >60 mL/min/1.73mˆ2 (ref >60)
GLUCOSE: 91 mg/dL (ref 70–110)
POTASSIUM: 4.6 mmol/L (ref 3.6–5.1)
PROTEIN TOTAL: 8.1 g/dL (ref 6.4–8.3)
PROTEIN TOTAL: 8.1 g/dL (ref 6.4–8.3)
SODIUM: 139 mmol/L (ref 136–144)

## 2017-11-22 LAB — CBC WITH DIFF
BASOPHIL #: <0.1 x10ˆ3/uL (ref <=0.20)
BASOPHIL %: 1 %
EOSINOPHIL #: 0.11 10*3/uL (ref <=0.50)
EOSINOPHIL %: 1 %
HCT: 48.8 % (ref 38.9–52.0)
HGB: 16.2 g/dL (ref 13.4–17.5)
IMMATURE GRANULOCYTE #: <0.1 x10ˆ3/uL (ref <0.10)
IMMATURE GRANULOCYTE %: 0 % (ref 0–1)
LYMPHOCYTE #: 0.99 x10ˆ3/uL — ABNORMAL LOW (ref 1.00–4.80)
LYMPHOCYTE %: 13 %
MCH: 32.1 pg — ABNORMAL HIGH (ref 26.0–32.0)
MCHC: 33.2 g/dL (ref 31.0–35.5)
MCV: 96.6 fL (ref 78.0–100.0)
MONOCYTE #: 0.82 x10ˆ3/uL (ref 0.20–1.10)
MONOCYTE %: 11 %
MPV: 12.1 fL (ref 8.7–12.5)
NEUTROPHIL #: 5.75 x10ˆ3/uL (ref 1.50–7.70)
NEUTROPHIL %: 74 %
PLATELETS: 128 x10ˆ3/uL — ABNORMAL LOW (ref 150–400)
RBC: 5.05 x10ˆ6/uL (ref 4.50–6.10)
RDW-CV: 12.7 % (ref 11.5–15.5)
WBC: 7.8 x10ˆ3/uL (ref 3.7–11.0)

## 2017-11-22 LAB — THYROXINE, FREE (FREE T4)
THYROXINE (T4), FREE: 1.16 ng/dL — ABNORMAL HIGH (ref 0.61–1.12)
THYROXINE (T4), FREE: 1.16 ng/dL — ABNORMAL HIGH (ref 0.61–1.12)

## 2017-11-22 LAB — THYROID STIMULATING HORMONE WITH FREE T4 REFLEX: TSH: 0.247 u[IU]/mL — ABNORMAL LOW (ref 0.450–5.330)

## 2017-11-23 ENCOUNTER — Emergency Department (EMERGENCY_DEPARTMENT_HOSPITAL): Payer: Medicare PPO

## 2017-11-23 ENCOUNTER — Inpatient Hospital Stay: Payer: Medicare PPO

## 2017-11-23 ENCOUNTER — Encounter (HOSPITAL_COMMUNITY): Payer: Self-pay

## 2017-11-23 ENCOUNTER — Inpatient Hospital Stay
Admission: EM | Admit: 2017-11-23 | Discharge: 2017-12-01 | DRG: 378 | Disposition: A | Discharge status: Home / Self Care | Payer: Medicare PPO | Attending: Internal Medicine | Admitting: Internal Medicine

## 2017-11-23 DIAGNOSIS — Z8701 Personal history of pneumonia (recurrent): Secondary | ICD-10-CM

## 2017-11-23 DIAGNOSIS — Z823 Family history of stroke: Secondary | ICD-10-CM

## 2017-11-23 DIAGNOSIS — Y831 Surgical operation with implant of artificial internal device as the cause of abnormal reaction of the patient, or of later complication, without mention of misadventure at the time of the procedure: Secondary | ICD-10-CM | POA: Diagnosis present

## 2017-11-23 DIAGNOSIS — R58 Hemorrhage, not elsewhere classified: Secondary | ICD-10-CM

## 2017-11-23 DIAGNOSIS — F329 Major depressive disorder, single episode, unspecified: Secondary | ICD-10-CM | POA: Diagnosis present

## 2017-11-23 DIAGNOSIS — K922 Gastrointestinal hemorrhage, unspecified: Secondary | ICD-10-CM | POA: Diagnosis present

## 2017-11-23 DIAGNOSIS — Q631 Lobulated, fused and horseshoe kidney: Secondary | ICD-10-CM

## 2017-11-23 DIAGNOSIS — Z820 Family history of epilepsy and other diseases of the nervous system: Secondary | ICD-10-CM

## 2017-11-23 DIAGNOSIS — F419 Anxiety disorder, unspecified: Secondary | ICD-10-CM | POA: Diagnosis present

## 2017-11-23 DIAGNOSIS — Z808 Family history of malignant neoplasm of other organs or systems: Secondary | ICD-10-CM

## 2017-11-23 DIAGNOSIS — G4733 Obstructive sleep apnea (adult) (pediatric): Secondary | ICD-10-CM | POA: Diagnosis present

## 2017-11-23 DIAGNOSIS — R9431 Abnormal electrocardiogram [ECG] [EKG]: Secondary | ICD-10-CM

## 2017-11-23 DIAGNOSIS — E039 Hypothyroidism, unspecified: Secondary | ICD-10-CM | POA: Diagnosis present

## 2017-11-23 DIAGNOSIS — E785 Hyperlipidemia, unspecified: Secondary | ICD-10-CM | POA: Diagnosis present

## 2017-11-23 DIAGNOSIS — R4182 Altered mental status, unspecified: Secondary | ICD-10-CM

## 2017-11-23 DIAGNOSIS — Z9049 Acquired absence of other specified parts of digestive tract: Secondary | ICD-10-CM

## 2017-11-23 DIAGNOSIS — N400 Enlarged prostate without lower urinary tract symptoms: Secondary | ICD-10-CM | POA: Diagnosis present

## 2017-11-23 DIAGNOSIS — Z888 Allergy status to other drugs, medicaments and biological substances status: Secondary | ICD-10-CM

## 2017-11-23 DIAGNOSIS — I10 Essential (primary) hypertension: Secondary | ICD-10-CM | POA: Diagnosis present

## 2017-11-23 DIAGNOSIS — R000 Tachycardia, unspecified: Secondary | ICD-10-CM

## 2017-11-23 DIAGNOSIS — R55 Syncope and collapse: Secondary | ICD-10-CM

## 2017-11-23 DIAGNOSIS — D649 Anemia, unspecified: Secondary | ICD-10-CM | POA: Diagnosis present

## 2017-11-23 DIAGNOSIS — Z8349 Family history of other endocrine, nutritional and metabolic diseases: Secondary | ICD-10-CM

## 2017-11-23 DIAGNOSIS — K5731 Diverticulosis of large intestine without perforation or abscess with bleeding: Principal | ICD-10-CM | POA: Diagnosis present

## 2017-11-23 DIAGNOSIS — I959 Hypotension, unspecified: Secondary | ICD-10-CM

## 2017-11-23 DIAGNOSIS — Z953 Presence of xenogenic heart valve: Secondary | ICD-10-CM

## 2017-11-23 DIAGNOSIS — Z832 Family history of diseases of the blood and blood-forming organs and certain disorders involving the immune mechanism: Secondary | ICD-10-CM

## 2017-11-23 DIAGNOSIS — Z8619 Personal history of other infectious and parasitic diseases: Secondary | ICD-10-CM

## 2017-11-23 DIAGNOSIS — Z8249 Family history of ischemic heart disease and other diseases of the circulatory system: Secondary | ICD-10-CM

## 2017-11-23 DIAGNOSIS — Z7982 Long term (current) use of aspirin: Secondary | ICD-10-CM

## 2017-11-23 DIAGNOSIS — D62 Acute posthemorrhagic anemia: Secondary | ICD-10-CM | POA: Diagnosis present

## 2017-11-23 DIAGNOSIS — G40909 Epilepsy, unspecified, not intractable, without status epilepticus: Secondary | ICD-10-CM | POA: Diagnosis present

## 2017-11-23 DIAGNOSIS — K219 Gastro-esophageal reflux disease without esophagitis: Secondary | ICD-10-CM | POA: Diagnosis present

## 2017-11-23 LAB — CBC WITH DIFF
BASOPHIL #: <0.1 x10ˆ3/uL (ref <=0.20)
BASOPHIL %: 1 %
EOSINOPHIL #: 0.16 x10ˆ3/uL (ref <=0.50)
EOSINOPHIL %: 2 %
HCT: 43.7 % (ref 38.9–52.0)
HGB: 14.5 g/dL (ref 13.4–17.5)
IMMATURE GRANULOCYTE #: <0.1 x10ˆ3/uL (ref <0.10)
IMMATURE GRANULOCYTE %: 1 % (ref 0–1)
LYMPHOCYTE #: 1.45 x10ˆ3/uL (ref 1.00–4.80)
LYMPHOCYTE %: 17 %
MCH: 32.2 pg — ABNORMAL HIGH (ref 26.0–32.0)
MCHC: 33.2 g/dL (ref 31.0–35.5)
MCV: 97.1 fL (ref 78.0–100.0)
MONOCYTE #: 0.99 x10ˆ3/uL (ref 0.20–1.10)
MONOCYTE %: 11 %
MPV: 11 fL (ref 8.7–12.5)
NEUTROPHIL #: 6.09 x10ˆ3/uL (ref 1.50–7.70)
NEUTROPHIL %: 68 %
PLATELETS: 154 x10ˆ3/uL (ref 150–400)
RBC: 4.5 x10ˆ6/uL (ref 4.50–6.10)
RDW-CV: 12.8 % (ref 11.5–15.5)
WBC: 8.8 10*3/uL (ref 3.7–11.0)

## 2017-11-23 LAB — VENOUS BLOOD GAS/LACTATE/CO-OX/LYTES (NA/K/CA/CL/GLUC) - ORS ONLY
BASE DEFICIT: 3.8 mmol/L — ABNORMAL HIGH (ref ?–3.0)
BICARBONATE (VENOUS): 21.7 mmol/L — ABNORMAL LOW (ref 22.0–26.0)
CARBOXYHEMOGLOBIN: 1.4 % (ref 0.0–2.5)
CHLORIDE: 112 mmol/L — ABNORMAL HIGH (ref 101–111)
GLUCOSE: 124 mg/dL — ABNORMAL HIGH (ref 60–105)
HEMOGLOBIN: 12 g/dL (ref 12.0–18.0)
IONIZED CALCIUM: 1.21 mmol/L (ref 1.10–1.35)
LACTATE: 1.4 mmol/L — ABNORMAL HIGH (ref 0.0–1.3)
MET-HEMOGLOBIN: 0.8 % (ref 0.0–2.0)
O2CT: 14.6 % (ref 6.7–17.5)
OXYHEMOGLOBIN: 86.7 % — ABNORMAL HIGH (ref 40.0–80.0)
PCO2 (VENOUS): 39 mmHg — ABNORMAL LOW (ref 41.00–51.00)
PH (VENOUS): 7.35 (ref 7.31–7.41)
PO2 (VENOUS): 56 mmHg — ABNORMAL HIGH (ref 35.0–50.0)
SODIUM: 137 mmol/L (ref 137–145)
WHOLE BLOOD POTASSIUM: 5.1 mmol/L — ABNORMAL HIGH (ref 3.5–4.6)

## 2017-11-23 LAB — BASIC METABOLIC PANEL
ANION GAP: 10 mmol/L (ref 4–13)
ANION GAP: 6 mmol/L (ref 4–13)
BUN/CREA RATIO: 13 (ref 6–22)
BUN/CREA RATIO: 17 (ref 6–22)
BUN: 21 mg/dL (ref 8–25)
BUN: 20 mg/dL (ref 8–25)
CALCIUM: 9.3 mg/dL (ref 8.5–10.2)
CALCIUM: 7.8 mg/dL — ABNORMAL LOW (ref 8.5–10.2)
CHLORIDE: 109 mmol/L (ref 96–111)
CHLORIDE: 113 mmol/L — ABNORMAL HIGH (ref 96–111)
CO2 TOTAL: 21 mmol/L — ABNORMAL LOW (ref 22–32)
CO2 TOTAL: 20 mmol/L — ABNORMAL LOW (ref 22–32)
CREATININE: 1.58 mg/dL — ABNORMAL HIGH (ref 0.62–1.27)
CREATININE: 1.21 mg/dL (ref 0.62–1.27)
ESTIMATED GFR: 45 mL/min/1.73mˆ2 — ABNORMAL LOW (ref >60)
ESTIMATED GFR: >60 mL/min/1.73mˆ2 (ref >60)
GLUCOSE: 97 mg/dL (ref 65–139)
GLUCOSE: 111 mg/dL (ref 65–139)
POTASSIUM: 4.6 mmol/L (ref 3.5–5.1)
POTASSIUM: 6 mmol/L (ref 3.5–5.1)
SODIUM: 140 mmol/L (ref 136–145)
SODIUM: 139 mmol/L (ref 136–145)

## 2017-11-23 LAB — TROPONIN-I (FOR ED ONLY): TROPONIN I: 30 ng/L (ref 0–30)

## 2017-11-23 LAB — MAGNESIUM
MAGNESIUM: 1.8 mg/dL (ref 1.6–2.6)
MAGNESIUM: 1.6 mg/dL (ref 1.6–2.6)

## 2017-11-23 LAB — H & H
HCT: 42.9 % (ref 38.9–52.0)
HCT: 38.2 % — ABNORMAL LOW (ref 38.9–52.0)
HGB: 14.3 g/dL (ref 13.4–17.5)
HGB: 12.8 g/dL — ABNORMAL LOW (ref 13.4–17.5)

## 2017-11-23 LAB — PHOSPHORUS
PHOSPHORUS: 3.9 mg/dL (ref 2.4–4.7)
PHOSPHORUS: 2.1 mg/dL — ABNORMAL LOW (ref 2.4–4.7)

## 2017-11-23 LAB — HEPATIC FUNCTION PANEL
ALBUMIN: 3.8 g/dL (ref 3.5–5.0)
ALKALINE PHOSPHATASE: 120 U/L — ABNORMAL HIGH (ref 45–115)
ALT (SGPT): 25 U/L (ref <55)
AST (SGOT): 34 U/L (ref 8–48)
BILIRUBIN DIRECT: 0.1 mg/dL (ref <0.3)
BILIRUBIN TOTAL: 0.4 mg/dL (ref 0.3–1.3)
PROTEIN TOTAL: 7.4 g/dL (ref 6.4–8.3)

## 2017-11-23 LAB — AMMONIA: AMMONIA: 50 umol/L (ref 15–50)

## 2017-11-23 LAB — HIV1/HIV2 SCREEN, COMBINED ANTIGEN AND ANTIBODY: HIV SCREEN, COMBINED ANTIGEN & ANTIBODY: NEGATIVE

## 2017-11-23 LAB — ECG 12-LEAD
Atrial Rate: 74 {beats}/min
Calculated P Axis: 35 degrees
Calculated R Axis: 27 degrees
Calculated T Axis: 60 degrees
PR Interval: 144 ms
QRS Duration: 90 ms
QT Interval: 390 ms
QTC Calculation: 432 ms
Ventricular rate: 74 {beats}/min

## 2017-11-23 LAB — HEPATITIS C ANTIBODY SCREEN WITH REFLEX TO HCV PCR: HCV ANTIBODY QUALITATIVE: NEGATIVE

## 2017-11-23 LAB — PT/INR
INR: 1.06 (ref 0.80–1.20)
INR: 1.12 (ref 0.80–1.20)
PROTHROMBIN TIME: 12.5 s (ref 9.5–14.1)
PROTHROMBIN TIME: 13.2 s (ref 9.5–14.1)

## 2017-11-23 LAB — POC BLOOD GLUCOSE (RESULTS)
GLUCOSE, POC: 110 mg/dL — ABNORMAL HIGH (ref 70–105)
GLUCOSE, POC: 127 mg/dL — ABNORMAL HIGH (ref 70–105)

## 2017-11-23 LAB — PTT (PARTIAL THROMBOPLASTIN TIME): APTT: 31.3 s (ref 24.1–38.5)

## 2017-11-23 LAB — VENOUS BLOOD GAS/LACTATE/CO-OX/LYTES (NA/K/CA/CL/GLUC): %FIO2 (VENOUS): 21 %

## 2017-11-23 LAB — LACTIC ACID LEVEL: LACTIC ACID: 1.5 mmol/L (ref 0.5–2.2)

## 2017-11-23 LAB — GAMMA GT: GGT: 38 U/L (ref 7–50)

## 2017-11-23 LAB — POTASSIUM: POTASSIUM: 4.6 mmol/L (ref 3.5–5.1)

## 2017-11-23 MED ORDER — SODIUM CHLORIDE 0.9 % (FLUSH) INJECTION SYRINGE
2.0000 mL | INJECTION | Freq: Three times a day (TID) | INTRAMUSCULAR | Status: DC
Start: 2017-11-23 — End: 2017-12-01
  Administered 2017-11-23: 2 mL
  Administered 2017-11-24 (×2): 0 mL
  Administered 2017-11-24: 2 mL
  Administered 2017-11-25: 10 mL
  Administered 2017-11-25 – 2017-11-27 (×8): 2 mL
  Administered 2017-11-28: 0 mL
  Administered 2017-11-28: 2 mL
  Administered 2017-11-29 – 2017-11-30 (×2): 0 mL
  Administered 2017-12-01: 10 mL

## 2017-11-23 MED ORDER — SODIUM CHLORIDE 0.9 % IV BOLUS
40.00 mL | INJECTION | Freq: Once | Status: AC | PRN
Start: 2017-11-23 — End: 2017-11-24

## 2017-11-23 MED ORDER — ONDANSETRON HCL (PF) 4 MG/2 ML INJECTION SOLUTION
4.00 mg | INTRAMUSCULAR | Status: AC
Start: 2017-11-23 — End: 2017-11-23
  Administered 2017-11-23: 4 mg via INTRAVENOUS

## 2017-11-23 MED ORDER — PEG 3350-ELECTROLYTES 236 GRAM-22.74 GRAM-6.74 GRAM-5.86 GRAM SOLUTION
4.0000 L | Freq: Once | ORAL | Status: AC
Start: 2017-11-23 — End: 2017-11-23
  Administered 2017-11-23: 4000 mL via ORAL
  Filled 2017-11-23: qty 4000

## 2017-11-23 MED ORDER — IOVERSOL 350 MG IODINE/ML INTRAVENOUS SOLUTION
100.00 mL | INTRAVENOUS | Status: AC
Start: 2017-11-23 — End: 2017-11-23
  Administered 2017-11-23: 100 mL via INTRAVENOUS

## 2017-11-23 MED ORDER — ONDANSETRON HCL (PF) 4 MG/2 ML INJECTION SOLUTION
INTRAMUSCULAR | Status: DC
Start: 2017-11-23 — End: 2017-11-24
  Administered 2017-11-23: 18:00:00 0 mg
  Filled 2017-11-23: qty 2

## 2017-11-23 MED ORDER — PANTOPRAZOLE 40 MG INTRAVENOUS SOLUTION
40.00 mg | INTRAVENOUS | Status: AC
Start: 2017-11-23 — End: 2017-11-23
  Administered 2017-11-23: 40 mg via INTRAVENOUS
  Filled 2017-11-23: qty 10

## 2017-11-23 MED ORDER — SODIUM CHLORIDE 0.9 % (FLUSH) INJECTION SYRINGE
2.0000 mL | INJECTION | INTRAMUSCULAR | Status: DC | PRN
Start: 2017-11-23 — End: 2017-12-01

## 2017-11-23 NOTE — Care Plan (Signed)
PLAN OF CARE  NPO  H/H EVERY 4 HOURS  COLONOSCOPY TOMORROW

## 2017-11-23 NOTE — ED Attending Note (Signed)
I was physically present and directly supervised this patient's care. Patient was seen and examined. The midlevel's/resident's history and exam were reviewed. Key elements in addition to and/or correction of that documentation are as follows:    Chief Complaint   Patient presents with   . Rectal Bleeding     large amount of bright red blood in stool that started 1.5 hours ago. has had a resection done of his colon and had a GI bleed in 2017. not on any blood thinners. pt is feeling dizzy and diaphoretic. +nausea. denies any abdominal pain.       Richard [name] is a 57 y.o. male p/w rectal bleeding.  Patient had a large amount of bright red blood of in his stool this started 0.5 hours ago.  Patient still feels like he is bleeding.  Patient has had this happen to him before.  He has had a bowel resection 2017. He has not any kind of blood thinners.  He feels dizzy and lightheaded.  He has also had nausea.  Denies any abdominal pain.      Pertinent Exam  Filed Vitals:    11/23/17 1614 11/23/17 1624 11/23/17 1632   BP: (!) 102/40 (!) 65/0 128/88   Pulse: 98  79   Resp: 18  (!) 26   Temp: 36.6 C (97.9 F)     SpO2: 97%  95%     Agree w resident    Course  Patient here with rectal bleeding.  He was hypotensive and altered on arrival here.  He patient came through triage.  When transferred patient to the bed he was more alert however he appeared pale and diaphoretic.  He was vomiting.  Patient was tachycardic.  Patient received blood in the emergency department.  Patient was admitted to the MICU.    CRITICAL CARE: Patient presented with/developed altered mental status, rectal bleeding. I spent 35 minutes while the patient was in this condition providing resuscitative care, bedside ultrasound interpretation, blood gas interpretation. My care also included , initial evaluation and stabilization, review of data, re-examination, discussion with admitting and consulting services to arrange definitive care, and was  exclusive of any procedures performed. In addition, I reviewed the resident's documentation and agree with the assessment and plan of care.        Results reviewed.       Impressions: agree w resident    Dispo: Admitted    Additional verbal discharge instructions were given and discussed with the patient    Herschel Senegal, MD 11/23/2017, 16:36  Emergency Medicine Attending    Chart completed after conclusion of patient care due to time constraints of direct patient care during shift.     THIS NOTE IS CREATED USING VOICE RECOGNITION SOFTWARE.  THE CHART HAS BEEN REVIEWED IN REAL TIME.  ERRORS AND OMISSIONS THAT WERE MISSED ARE UNINTENDED.

## 2017-11-23 NOTE — ED Nurses Note (Signed)
Pt presents to er via private vehicle for c/o rectal bleeding that started this afternoon at 230pm. Pt's wife brought him to the emergency room for evaluation. Pt became pale, diaphoretic,and dizzy in triage pt brought back to er room 18, pt went unresponsive, with snoring respirations, nurses lifted pt in to bed, pt awoke however was still slow to respond. #18 gauge LAC established, blood work obtain and sent to lab and a I.v. Bolus of saline started. Pt is more awake and responsive, Family at bedside. Pt states this morning he awoke and felt fine no complaints, pt states that he ate lunch after that he developed abd pain and cramping. Pt states he went to the restroom where he had bright red blood from his rectum. Pt state he has a hx of GI bleeds and multiple bowel resections. Pt incontinent of a small amount of bright red blood pt cleaned with soap and water, clean chucks applied. Pt denies any recent falls or injuries, bed lock in lowest position, call light in hand, pt given warm blankets and pillow for comfort. Pt denies any further needs at this time. Will continue to monitor.

## 2017-11-23 NOTE — ED Nurses Note (Signed)
rn report called at this time, awaiting clean bed at this time.

## 2017-11-23 NOTE — ED Nurses Note (Signed)
EKG at bedside

## 2017-11-23 NOTE — ED Provider Notes (Signed)
Emergency Department  Provider Note    Name: Richard [name]  Age and Gender: 57 y.o. male  Date of Birth: 1960/12/24  Date of Service: 11/23/2017  MRN: [id number]  PCP: Gavin Potters, APRN  Attending: Dr. Delight Ovens    Arrival: The patient arrived by private car and is accompanied by patient and wife.    History Obtained by: provider  History Limitations: none    CC:  Chief Complaint   Patient presents with   . Rectal Bleeding     large amount of bright red blood in stool that started 1.5 hours ago. has had a resection done of his colon and had a GI bleed in 2017. not on any blood thinners. pt is feeling dizzy and diaphoretic. +nausea. denies any abdominal pain.     HPI:  Richard [name] is a 57 y.o. White male with history of colon resection in 2017 and aortic valve replacement presenting with rectal bleeding. The pt's wife states about 1.5-2 hours ago he started bleeding bright red blood from his rectum. He reports associated nausea, dizziness, diaphoresis, and vomiting. The onset of sx was sudden. She states this has happened 1x before in 2017 after his bowel resection. He was required to get a blood transfusion at that time. Denies anticoagulation. Pt was unconscious upon arrival to the ED today but became awake upon being placed in the supine position. Denies all other conditions at this time.    ROS:  Constitutional: No fever, chills or weakness, +dizziness  Skin: No rash, +diaphoresis  HENT: No headaches, or congestion.  Eyes: No vision changes or photophobia.  Cardio: No chest pain, palpitations or leg swelling.  Respiratory: No cough, wheezing or SOB.  GI:  No stool changes, +nausea, vomiting, +rectal bleeding  GU:  No dysuria, hematuria, or increased frequency.  MSK: No muscle aches, joint or back pain.  Neuro: No seizures, LOC, numbness, tingling, or focal weakness.  Psychiatric: No depression, SI or substance abuse.  All other systems reviewed and are negative.    Below pertinent information  reviewed with patient:  Past Medical History:   Diagnosis Date   . Anxiety    . BPH (benign prostatic hyperplasia)    . Congenital anomaly of kidney    . Depression    . Epilepsy (CMS [place])    . Esophageal reflux    . High blood pressure    . Hx of aortic valve replacement    . Hypercholesterolemia    . Hyperlipidemia    . Hypothyroidism    . Mass of esophagus    . Nodular prostate    . Palpitations    . Pneumonia    . Testicular hypofunction      Medications Prior to Admission     Prescriptions    amitriptyline (ELAVIL) 10 mg Oral Tablet    Take 10 mg by mouth Every night    amLODIPine (NORVASC) 10 mg Oral Tablet    Take 10 mg by mouth Once a day    calcium citrate-vitamin D3 (CITRACAL) 200 mg calcium -250 unit Oral Tablet    Take by mouth Once a day    cyclobenzaprine (FLEXERIL) 10 mg Oral Tablet    TAKE 1 TABLET BY MOUTH ONCE EVERY NIGHT    docusate sodium (COLACE) 100 mg Oral Capsule    Take 100 mg by mouth Twice daily    ergocalciferol, vitamin D2, (VITAMIN D2 ORAL)    Take 1 Tab by mouth Once a day  fluticasone propionate (FLONASE) 50 mcg/actuation Nasal Spray, Suspension    2 Sprays by Each Nostril route Once a day    levETIRAcetam (KEPPRA) 500 mg Oral Tablet    Take 250 mg by mouth Twice daily     levothyroxine (SYNTHROID) 100 mcg Oral Tablet    Take 100 mcg by mouth Every morning    losartan (COZAAR) 50 mg Oral Tablet    Take 50 mg by mouth Once a day    melatonin 10 mg Oral Tablet    Take 10 mg by mouth Every night    multivitamin Oral Tablet    Take 1 Tab by mouth Once a day    OM-3-DHA-EPA-Fish Oil-Vit D3 (FISH OIL-VIT D3) 300-1,000-1,000 mg-mg-unit Oral Capsule    Take 3 Caps by mouth Once a day Dosage 2000, mg fish oil/1000 iu Vitamin d3     pantoprazole (PROTONIX) 40 mg Oral Tablet, Delayed Release (E.C.)    Take 40 mg by mouth Twice daily    polyethylene glycol (MIRALAX) 17 gram/dose Oral Powder    Take 17 g by mouth Once a day    tamsulosin (FLOMAX) 0.4 mg Oral Capsule    Take 0.4 mg by mouth Every  evening after dinner        Allergies   Allergen Reactions   . Statins-Hmg-Coa Reductase Inhibitors Nausea/ Vomiting     Past Surgical History:   Procedure Laterality Date   . COLONOSCOPY     . HX AORTIC VALVE REPLACEMENT  2009   . HX COLECTOMY     . HX TURP     . HX UPPER ENDOSCOPY  04/05/2016   . INCISIONAL HERNIA REPAIR     . VENTRAL HERNIA REPAIR       Family History   Problem Relation Age of Onset   . Coronary Artery Disease Mother    . High Cholesterol Mother    . Stroke Mother    . Thyroid Disease Mother    . Coronary Artery Disease Father    . Heart Attack Father    . High Cholesterol Father    . Hypertension (High Blood Pressure) Sister    . Blood Clots Paternal Grandmother         skin cancer    . Hypertension (High Blood Pressure) Sister    . Epilepsy Sister        Social History     Tobacco Use   . Smoking status: Never Smoker   . Smokeless tobacco: Never Used   Substance Use Topics   . Alcohol use: Yes     Alcohol/week: 1.0 standard drinks     Types: 1 Glasses of wine per week     Binge frequency: Less than monthly     Objective:    ED Triage Vitals [11/23/17 1614]   Enc Vitals Group      BP (Non-Invasive) (!) 102/40      Heart Rate 98      Respiratory Rate 18      Temperature 36.6 C (97.9 F)      SpO2 97 %      Height 1.753 m (5\' 9" )     Pulse Ox interpreted by me: 98% on None (Room Air); Normal    Nursing notes and vital signs reviewed.    Constitutional - Obese. Unconscious upon arrival but became awake after being put in the supine position.  HEENT - Normocephalic. Atraumatic. PERRLA. EOMI. Conjunctiva pale. Oropharynx with no erythema, lesions, or exudates. Moist  mucous membranes. No sinus tenderness to palpation.  Neck - Trachea midline. No adenopathy. No tenderness to palpation.  Cardiac - Tachycardiac rate, regular rhythm. No murmurs, rubs, or gallops appreciated. No edema. Radial pulse palpated.  Respiratory - Clear to auscultation bilaterally. No rales or rhonchi. Good air movement.  Abdomen  - Non-tender. No rebound or guarding. Normoactive bowel sounds auscultated. Mild diffuse abd ttp. Well-healed middle laparotomy scar.  Musculoskeletal - Good AROM. No muscle or joint tenderness appreciated.   Skin - Warm and dry. No acute concerning lesions.  Neuro - CN II - XII intact. Strength and sensation intact throughout.  Psych - Answers questions appropriately.     Labs:   Results for orders placed or performed during the hospital encounter of 11/23/17   PT/INR   Result Value Ref Range    PROTHROMBIN TIME 12.5 9.5 - 14.1 seconds    INR 1.06 0.80 - 1.20   CBC WITH DIFF   Result Value Ref Range    WBC 8.8 3.7 - 11.0 x10^3/uL    RBC 4.50 4.50 - 6.10 x10^6/uL    HGB 14.5 13.4 - 17.5 g/dL    HCT 43.7 38.9 - 52.0 %    MCV 97.1 78.0 - 100.0 fL    MCH 32.2 (H) 26.0 - 32.0 pg    MCHC 33.2 31.0 - 35.5 g/dL    RDW-CV 12.8 11.5 - 15.5 %    PLATELETS 154 150 - 400 x10^3/uL    MPV 11.0 8.7 - 12.5 fL    NEUTROPHIL % 68 %    LYMPHOCYTE % 17 %    MONOCYTE % 11 %    EOSINOPHIL % 2 %    BASOPHIL % 1 %    NEUTROPHIL # 6.09 1.50 - 7.70 x10^3/uL    LYMPHOCYTE # 1.45 1.00 - 4.80 x10^3/uL    MONOCYTE # 0.99 0.20 - 1.10 x10^3/uL    EOSINOPHIL # 0.16 <=0.50 x10^3/uL    BASOPHIL # <0.10 <=0.20 x10^3/uL    IMMATURE GRANULOCYTE % 1 0 - 1 %    IMMATURE GRANULOCYTE # <0.10 <0.10 x10^3/uL   H & H   Result Value Ref Range    HGB 14.3 13.4 - 17.5 g/dL    HCT 42.9 38.9 - 52.0 %   POC BLOOD GLUCOSE (RESULTS)   Result Value Ref Range    GLUCOSE, POC 110 (H) 70 - 105 mg/dl     Imaging:   No orders to display     . EKG: 12-Lead EKG interpreted by staff.    MDM/Course:  Patient seen and examined.  Richard [name] is 57 y.o. male who presented with rectal bleeding.    Orders Placed This Encounter   . ED FAST EXAM ULTRASOUND   . HEPATITIS C ANTIBODY SCREEN WITH REFLEX TO HCV PCR   . HIV1/HIV2 SCREEN, COMBINED ANTIGEN AND ANTIBODY   . CBC/DIFF   . PT/INR   . BASIC METABOLIC PANEL   . CBC WITH DIFF   . CANCELED: COMPREHENSIVE METABOLIC PANEL, NF   .  CANCELED: BASIC METABOLIC PANEL, NON-FASTING   . MAGNESIUM   . PHOSPHORUS   . TROPONIN-I (FOR ED ONLY)   . H & H   . BASIC METABOLIC PANEL   . ECG 47-WGNF   . TYPE AND SCREEN   . INSERT PERIPHERAL IV    . pantoprazole (PROTONIX) injection     ED Course as of Nov 26 842   Thu Nov 23, 2017   1657  HGB: 14.3 [JH]   1657 HCT: 42.9 [JH]   1657 WBC: 8.8 [JH]   1657 HGB: 14.5 [JH]   3545 Paged gastroenterology    [JH]      ED Course User Index  [JH] Pamalee Leyden, MD      Patient evaluated immediately upon arrival to the emergency department.  Called urgently to bedside as patient is unresponsive.  Placed from wheelchair into bed and patient awoke, likely secondary to hypotension.  He states that his bleeding started 90 minutes prior to arrival.   Found have normal hemoglobin and hematocrit but this is not reassuring given his recent start of symptoms.   2 units PRBC ordered and transfused from the emergency department for symptomatic anemia.   Patient admitted to Medical ICU with GI consult.  He understands this plan and is in agreement at this time.   No central line or arterial line required in the emergency department as patient has significant improvement after his initial resuscitation.  Two proximal peripheral IV lines were successfully established.    Disposition: Admitted    Clinical Impression:     Encounter Diagnoses   Name Primary?   . Acute GI bleeding Yes   . Syncope, unspecified syncope type      Patient seen by and discussed with attending physician, Dr. Delight Ovens.    Parts of this patients chart were completed in a retrospective fashion due to simultaneous direct patient care activities in the Emergency Department. Portions of this note may have been dictated using voice recognition software.     I am scribing for, and in the presence of, Dr. Pamalee Leyden for services provided on 11/23/2017  Majel Homer, [place]    // Majel Homer, [place]  11/23/2017, 16:35      I attest that I personally  performed the services described in this documentation, as scribed  in my presence, and it is both accurate  and complete.    Pamalee Leyden, MD   11/23/2017, 16:50  [facility]  PGY-3 Emergency Medicine

## 2017-11-23 NOTE — Nurses Notes (Signed)
Pt arrived to MICU  25 via RN escort from ED. RN at bedside. Pt attached to monitor. VS and assessment per flowsheet.

## 2017-11-23 NOTE — H&P (Signed)
[facility]  MICU History and Physical    Richard, [name], 57 y.o. male  Date of Admission:  11/23/2017  Date of service: 11/23/2017   Date of Birth:  11/15/1960    PCP: Gavin Potters, APRN    Information Obtained from: patient  Chief Complaint:  GI bleed    HPI: Richard [name] is a 57 y.o. male who presented to ED today after what he described as large volume bright red blood in stool.  He reports that he was also having N/V and dizziness when the bleeding began and actually had an episode of syncope today.  He has had GI bleed before in 2017 after partial colectomy; denies having any problems with bleeding ever since; not on any home blood thinners.  After the syncopal episode he reported to the ED as he still felt like he was bleeding.  Patients wife noted that he had seemed altered after the initial episode.  Initial hemoglobin within normal limits however admission to MICU was requested.  Many of patient's symptoms had resolved upon admission.      Past Medical History:   Diagnosis Date   . Anxiety    . BPH (benign prostatic hyperplasia)    . Congenital anomaly of kidney     HORSESHOE KIDNEY   . Depression    . Diarrhea     CDIFF   . Epilepsy (CMS [place])     OVER 10 YEARS SINCE LAST SEIZURE-DR WIEMER   . Esophageal reflux    . GIB (gastrointestinal bleeding) 2017   . High blood pressure    . Hx of aortic valve replacement    . Hypercholesterolemia    . Hyperlipidemia    . Hypothyroidism    . Mass of esophagus    . Nodular prostate    . Palpitations    . Pneumonia    . Testicular hypofunction          Past Surgical History:   Procedure Laterality Date   . COLONOSCOPY     . HX AORTIC VALVE REPLACEMENT  2009   . HX COLECTOMY  2016    FOR OBSTRUCTION   . HX TURP     . HX UPPER ENDOSCOPY  04/05/2016   . INCISIONAL HERNIA REPAIR     . VENTRAL HERNIA REPAIR             Medications Prior to Admission     Prescriptions    amitriptyline (ELAVIL) 10 mg Oral Tablet    Take 10 mg by mouth Every night      amLODIPine (NORVASC) 10 mg Oral Tablet    Take 10 mg by mouth Once a day    calcium citrate-vitamin D3 (CITRACAL) 200 mg calcium -250 unit Oral Tablet    Take by mouth Once a day    cyclobenzaprine (FLEXERIL) 10 mg Oral Tablet    TAKE 1 TABLET BY MOUTH ONCE EVERY NIGHT    docusate sodium (COLACE) 100 mg Oral Capsule    Take 100 mg by mouth Twice daily    ergocalciferol, vitamin D2, (VITAMIN D2 ORAL)    Take 1 Tab by mouth Once a day    fluticasone propionate (FLONASE) 50 mcg/actuation Nasal Spray, Suspension    2 Sprays by Each Nostril route Once a day    levETIRAcetam (KEPPRA) 500 mg Oral Tablet    Take 250 mg by mouth Twice daily     levothyroxine (SYNTHROID) 100 mcg Oral Tablet  Take 100 mcg by mouth Every morning    losartan (COZAAR) 50 mg Oral Tablet    Take 50 mg by mouth Once a day    melatonin 10 mg Oral Tablet    Take 10 mg by mouth Every night    multivitamin Oral Tablet    Take 1 Tab by mouth Once a day    OM-3-DHA-EPA-Fish Oil-Vit D3 (FISH OIL-VIT D3) 300-1,000-1,000 mg-mg-unit Oral Capsule    Take 3 Caps by mouth Once a day Dosage 2000, mg fish oil/1000 iu Vitamin d3     pantoprazole (PROTONIX) 40 mg Oral Tablet, Delayed Release (E.C.)    Take 40 mg by mouth Twice daily    polyethylene glycol (MIRALAX) 17 gram/dose Oral Powder    Take 17 g by mouth Once a day    tamsulosin (FLOMAX) 0.4 mg Oral Capsule    Take 0.4 mg by mouth Every evening after dinner          Current Facility-Administered Medications:  NS bolus infusion 40 mL 40 mL Intravenous Once PRN   NS flush syringe 2 mL Intracatheter Q8HRS   And      NS flush syringe 2-6 mL Intracatheter Q1 MIN PRN   ondansetron (ZOFRAN) 2 mg/mL injection ---Cabinet Override      polyethylene glycol electrolyte (goLYTELY) oral liquid 4 L Oral Once       Allergies   Allergen Reactions   . Statins-Hmg-Coa Reductase Inhibitors Nausea/ Vomiting       Family History  Family Medical History:     Problem Relation (Age of Onset)    Blood Clots Paternal Grandmother     Coronary Artery Disease Mother, Father    Epilepsy Sister    Heart Attack Father    High Cholesterol Mother, Father    Hypertension (High Blood Pressure) Sister, Sister    Stroke Mother    Thyroid Disease Mother              Social History  Social History     Tobacco Use   . Smoking status: Never Smoker   . Smokeless tobacco: Never Used   Substance Use Topics   . Alcohol use: Yes     Alcohol/week: 1.0 standard drinks     Types: 1 Glasses of wine per week     Binge frequency: Less than monthly   . Drug use: Never       ROS:   Constitutional:  Negative for fever, chills  Eyes:  Negative for visual disturbance, blurred vision, irritation  Ears, Nose, Mouth, Throat, Face:  Negative for ear discharge, nasal congestion, sore throat  Respiratory:  Negative for cough, sputum, dyspnea on exertion  Cardiovascular:  Negative for chest pain, dyspnea, palpitations, lower ext. Edema  Gastrointestinal:  Negative for nausea, vomiting, diarrhea, constipation, abdominal pain  Genitourinary:  Negative for frequency, dysuria, hematuria  Integument/breast:  Negative for rash, pruritus, lesions  Hematologic:  Negative for bleeding, easy bruising, lymphadenopathy  Musculoskeletal:  Negative for myalgias, arthralgias, back pain, muscle weakness  Neurological:  Negative for headaches, dizziness, seizures  Behavioral/Psych:  Negative for depression, anxiety    EXAM:  Temperature: 36.4 C (97.5 F)  Heart Rate: 76  BP (Non-Invasive): 119/76  Respiratory Rate: 20  SpO2: 97 %  Pain Score (Numeric, Faces): 0  General:  no acute distress; vital signs reviewed.    Eyes: Conjunctiva clear.  Pupils equal, round, reactive.  No scleral icterus.    HENT:  Normocephalic, atraumatic. Mucous membranes moist.  Neck: Supple, trachea midline.  No JVD or carotid bruit noted.  Lungs: Clear bilaterally.  No wheezes, rales, or rhonchi noted.    Cardiovascular:  Regular rate and rhythm.  No murmurs, rubs, or gallops noted.    Abdomen:  mild distension; Soft,  non-tender.  Bowel sounds normal.  Extremities: No cyanosis or edema.    Skin:  No rashes or lesions noted.    Neurologic:  Cranial nerves, motor, sensory grossly normal.    Labs:    I have reviewed all lab results.    Imaging Studies:    All imaging reviewed    DNR Status:  Full Code    Assessment/Plan:  Active Hospital Problems    Diagnosis   . Acute GI bleeding       FERDINAND REVOIR is a 57 y.o. male admitted to the MICU for acute GI bleed.    Lower GI Bleed due to Hemorrhoid vs Fissure vs Diverticulitis vs Malignancy   -CT angio negative for acute hemorrhage  -hemoglobin currently stable; patient asymptomatic   -GI performing Colonoscopy tomorrow; start bowel prep tonight, NPO  -trend hemoglobin Q4h      Pain/Sedation:  N/A  GI Prophylaxis:  Holding for now  DVT/PE Prophylaxis:  Holding in setting of acute bleed    Mylinda Latina, M.D.  Internal Medicine  PGY-2    Critical Care Time:  Total critical care time spent in direct care of this patient at high risk based on presenting history/exam/and complaint, including initial evaluation and stabilization, review of data, re-examination, discussion with admitting and consulting services to arrange definitive care, and exclusive of any procedures performed, was 33 minutes.    Admitted from the ER for GI bleed.  Monitored closely, with slow downtrend in hemoglobin (but did not require transfusion).  Completed bowel prep, with some blood in stool.  Colonoscopy today.  OK for transfer to step down, though may need ongoing resuscitation and continued close monitoring.    I saw and examined the patient.  I reviewed the resident's note.  I agree with the findings and plan of care as documented in the resident's note.  Any exceptions/additions are edited/noted.    Charlann Boxer Freda Munro, MD  11:41 11/24/2017   Assistant Professor  Pulmonary/Critical Care   [facility] [employer]

## 2017-11-23 NOTE — ED Nurses Note (Signed)
Nurse in to hang blood, pt became pale, diaphoretic, dizzy, and nauseous, MD notified and at bedside. Pt blood started via nurse.

## 2017-11-23 NOTE — Consults (Signed)
[facility]  Digestive Diseases Consult      Richard [name], Richard [name], 57 y.o. male  Encounter Start Date:  11/23/2017  Inpatient Admission Date:  11/23/2017  Date of service: 11/23/2017  Date of Birth:  Dec 29, 1960    Hospital Day:  LOS: 0 days     Service: MICU  Requesting MD: Freda Munro    Information Obtained from: patient  Chief Complaint:  bright red blood per rectum    Assessment/Recommendations:   57 year old male patient with history of bowel resection presenting for painless hematochezia    # Hematochezia  # Syncope    - R/O LGIB  - R/o diverticular vs hemorrhoidal, vs bleeding polyp vs malignancy vs ischemic  - please obtain outside colonoscopy records  - will plan for a colonoscopy tomorrow  - Please give bowel prep per colonoscopy order set  - Risks and benefits of the procedure explained to the patient who agreed to proceed with the procedure      HPI/Discussion:  Richard [name] is a 57 y.o., male patient  with multiple medical problems presenting for  the above chief complaint.  He reports 3 episodes of painless hematochezia.  Denies any nausea or vomiting.  Denies any melena.  Upon presentation to the emergency department he had 1 episode of non-bloody vomiting and syncope which prompted his admission to the ICU.  Patient reports that he has had 2 colonoscopies in the past, last of which was in 2017 in [place].  He  also reports a history of lower GI bleed and bowel resection for?  Bowel obstruction in 2017. no known family history of colorectal cancer.  He is on baby aspirin.    Past Medical History:   Diagnosis Date   . Anxiety    . BPH (benign prostatic hyperplasia)    . Congenital anomaly of kidney     HORSESHOE KIDNEY   . Depression    . Diarrhea     CDIFF   . Epilepsy (CMS [place])     OVER 10 YEARS SINCE LAST SEIZURE-DR WIEMER   . Esophageal reflux    . GIB (gastrointestinal bleeding) 2017   . High blood pressure    . Hx of aortic valve replacement    . Hypercholesterolemia    .  Hyperlipidemia    . Hypothyroidism    . Mass of esophagus    . Nodular prostate    . Palpitations    . Pneumonia    . Testicular hypofunction          Past Surgical History:   Procedure Laterality Date   . COLONOSCOPY     . HX AORTIC VALVE REPLACEMENT  2009   . HX COLECTOMY  2016    FOR OBSTRUCTION   . HX TURP     . HX UPPER ENDOSCOPY  04/05/2016   . INCISIONAL HERNIA REPAIR     . VENTRAL HERNIA REPAIR           Medications Prior to Admission     Prescriptions    amitriptyline (ELAVIL) 10 mg Oral Tablet    Take 10 mg by mouth Every night    amLODIPine (NORVASC) 10 mg Oral Tablet    Take 10 mg by mouth Once a day    calcium citrate-vitamin D3 (CITRACAL) 200 mg calcium -250 unit Oral Tablet    Take by mouth Once a day    cyclobenzaprine (FLEXERIL) 10 mg Oral Tablet    TAKE 1 TABLET BY MOUTH ONCE  EVERY NIGHT    docusate sodium (COLACE) 100 mg Oral Capsule    Take 100 mg by mouth Twice daily    ergocalciferol, vitamin D2, (VITAMIN D2 ORAL)    Take 1 Tab by mouth Once a day    fluticasone propionate (FLONASE) 50 mcg/actuation Nasal Spray, Suspension    2 Sprays by Each Nostril route Once a day    levETIRAcetam (KEPPRA) 500 mg Oral Tablet    Take 250 mg by mouth Twice daily     levothyroxine (SYNTHROID) 100 mcg Oral Tablet    Take 100 mcg by mouth Every morning    losartan (COZAAR) 50 mg Oral Tablet    Take 50 mg by mouth Once a day    melatonin 10 mg Oral Tablet    Take 10 mg by mouth Every night    multivitamin Oral Tablet    Take 1 Tab by mouth Once a day    OM-3-DHA-EPA-Fish Oil-Vit D3 (FISH OIL-VIT D3) 300-1,000-1,000 mg-mg-unit Oral Capsule    Take 3 Caps by mouth Once a day Dosage 2000, mg fish oil/1000 iu Vitamin d3     pantoprazole (PROTONIX) 40 mg Oral Tablet, Delayed Release (E.C.)    Take 40 mg by mouth Twice daily    polyethylene glycol (MIRALAX) 17 gram/dose Oral Powder    Take 17 g by mouth Once a day    tamsulosin (FLOMAX) 0.4 mg Oral Capsule    Take 0.4 mg by mouth Every evening after dinner                 Current Facility-Administered Medications:  NS bolus infusion 40 mL 40 mL Intravenous Once PRN   NS flush syringe 2 mL Intracatheter Q8HRS   And      NS flush syringe 2-6 mL Intracatheter Q1 MIN PRN   ondansetron (ZOFRAN) 2 mg/mL injection ---Cabinet Override      polyethylene glycol electrolyte (goLYTELY) oral liquid 4 L Oral Once     Allergies   Allergen Reactions   . Statins-Hmg-Coa Reductase Inhibitors Nausea/ Vomiting       Family History  Family Medical History:     Problem Relation (Age of Onset)    Blood Clots Paternal Grandmother    Coronary Artery Disease Mother, Father    Epilepsy Sister    Heart Attack Father    High Cholesterol Mother, Father    Hypertension (High Blood Pressure) Sister, Sister    Stroke Mother    Thyroid Disease Mother          Social History  Social History     Socioeconomic History   . Marital status: Married     Spouse name: Not on file   . Number of children: Not on file   . Years of education: Not on file   . Highest education level: Not on file   Occupational History   . Occupation: disabled    Social Needs   . Financial resource strain: Not on file   . Food insecurity:     Worry: Not on file     Inability: Not on file   . Transportation needs:     Medical: Not on file     Non-medical: Not on file   Tobacco Use   . Smoking status: Never Smoker   . Smokeless tobacco: Never Used   Substance and Sexual Activity   . Alcohol use: Yes     Alcohol/week: 1.0 standard drinks     Types: 1 Glasses of  wine per week     Binge frequency: Less than monthly   . Drug use: Never   . Sexual activity: Yes   Lifestyle   . Physical activity:     Days per week: Not on file     Minutes per session: Not on file   . Stress: Not on file   Relationships   . Social connections:     Talks on phone: Not on file     Gets together: Not on file     Attends religious service: Not on file     Active member of club or organization: Not on file     Attends meetings of clubs or organizations: Not on file      Relationship status: Not on file   . Intimate partner violence:     Fear of current or ex partner: Not on file     Emotionally abused: Not on file     Physically abused: Not on file     Forced sexual activity: Not on file   Other Topics Concern   . Not on file   Social History Narrative   . Not on file       ROS:   Other than ROS in the HPI, all other systems were negative.    EXAM:  Temperature: 36.4 C (97.5 F)  Heart Rate: 76  BP (Non-Invasive): 119/84  Respiratory Rate: 18  SpO2: 96 %  Pain Score (Numeric, Faces): 0  Constitutional: no distress  Eyes: Conjunctiva clear.  ENT: ENMT without erythema or injection, mucous membranes moist.  Neck: no thyromegaly or lymphadenopathy  Respiratory: Clear to auscultation bilaterally.   Cardiovascular: regular rate and rhythm  Gastrointestinal: Soft, non-tender  Genitourinary: Deferred  Musculoskeletal: Head atraumatic and normocephalic  Integumentary:  Skin warm and dry  Neurologic: Grossly normal  Lymphatic/Immunologic/Hematologic: No lymphadenopathy  Psychiatric: Normal        Labs:    Lab Results Today:    Results for orders placed or performed during the hospital encounter of 11/23/17 (from the past 24 hour(s))   HEPATITIS C ANTIBODY SCREEN WITH REFLEX TO HCV PCR   Result Value Ref Range    HCV ANTIBODY QUALITATIVE Negative Negative   HIV1/HIV2 SCREEN, COMBINED ANTIGEN AND ANTIBODY   Result Value Ref Range    HIV SCREEN, COMBINED ANTIGEN & ANTIBODY Negative Negative   PT/INR   Result Value Ref Range    PROTHROMBIN TIME 12.5 9.5 - 14.1 seconds    INR 1.06 0.80 - 0.27   BASIC METABOLIC PANEL   Result Value Ref Range    SODIUM 140 136 - 145 mmol/L    POTASSIUM 4.6 3.5 - 5.1 mmol/L    CHLORIDE 109 96 - 111 mmol/L    CO2 TOTAL 21 (L) 22 - 32 mmol/L    ANION GAP 10 4 - 13 mmol/L    CALCIUM 9.3 8.5 - 10.2 mg/dL    GLUCOSE 97 65 - 139 mg/dL    BUN 21 8 - 25 mg/dL    CREATININE 1.58 (H) 0.62 - 1.27 mg/dL    BUN/CREA RATIO 13 6 - 22    ESTIMATED GFR 45 (L) >60 mL/min/1.32m2      TYPE AND SCREEN   Result Value Ref Range    UNITS ORDERED 2     ABO/RH(D) O POSITIVE     ANTIBODY SCREEN NEGATIVE     SPECIMEN EXPIRATION DATE 11/26/2017    CBC WITH DIFF   Result Value Ref Range  WBC 8.8 3.7 - 11.0 x10^3/uL    RBC 4.50 4.50 - 6.10 x10^6/uL    HGB 14.5 13.4 - 17.5 g/dL    HCT 43.7 38.9 - 52.0 %    MCV 97.1 78.0 - 100.0 fL    MCH 32.2 (H) 26.0 - 32.0 pg    MCHC 33.2 31.0 - 35.5 g/dL    RDW-CV 12.8 11.5 - 15.5 %    PLATELETS 154 150 - 400 x10^3/uL    MPV 11.0 8.7 - 12.5 fL    NEUTROPHIL % 68 %    LYMPHOCYTE % 17 %    MONOCYTE % 11 %    EOSINOPHIL % 2 %    BASOPHIL % 1 %    NEUTROPHIL # 6.09 1.50 - 7.70 x10^3/uL    LYMPHOCYTE # 1.45 1.00 - 4.80 x10^3/uL    MONOCYTE # 0.99 0.20 - 1.10 x10^3/uL    EOSINOPHIL # 0.16 <=0.50 x10^3/uL    BASOPHIL # <0.10 <=0.20 x10^3/uL    IMMATURE GRANULOCYTE % 1 0 - 1 %    IMMATURE GRANULOCYTE # <0.10 <0.10 x10^3/uL   CROSSMATCH RED CELLS - UNITS , 2 Units   Result Value Ref Range    Coding System ISBT128     UNIT NUMBER [id number]     BLOOD COMPONENT TYPE LR RBC, Adsol3, 04761     UNIT DIVISION 00     UNIT DISPENSE STATUS ISSUED,FINAL     TRANSFUSION STATUS OK TO TRANSFUSE     IS CROSSMATCH Electronically Compatible     Product Code F6812X51     Coding System [id number]     UNIT NUMBER [id number]     BLOOD COMPONENT TYPE LR RBC, Adsol3, 04730     UNIT DIVISION 00     UNIT DISPENSE STATUS ISSUED,FINAL     TRANSFUSION STATUS OK TO TRANSFUSE     IS CROSSMATCH Electronically Compatible     Product Code F1638G66    PTT (PARTIAL THROMBOPLASTIN TIME)   Result Value Ref Range    APTT 31.3 24.1 - 38.5 seconds   POC BLOOD GLUCOSE (RESULTS)   Result Value Ref Range    GLUCOSE, POC 110 (H) 70 - 105 mg/dl   MAGNESIUM   Result Value Ref Range    MAGNESIUM 1.8 1.6 - 2.6 mg/dL   PHOSPHORUS   Result Value Ref Range    PHOSPHORUS 3.9 2.4 - 4.7 mg/dL   TROPONIN-I (FOR ED ONLY)   Result Value Ref Range    TROPONIN I 30 0 - 30 ng/L   H & H   Result Value Ref Range    HGB 14.3 13.4 -  17.5 g/dL    HCT 42.9 38.9 - 52.0 %   HEPATIC FUNCTION PANEL   Result Value Ref Range    ALBUMIN 3.8 3.5 - 5.0 g/dL    ALKALINE PHOSPHATASE 120 (H) 45 - 115 U/L    ALT (SGPT) 25 <55 U/L    AST (SGOT) 34 8 - 48 U/L    BILIRUBIN TOTAL 0.4 0.3 - 1.3 mg/dL    BILIRUBIN DIRECT 0.1 <0.3 mg/dL    PROTEIN TOTAL 7.4 6.4 - 8.3 g/dL   GAMMA GT   Result Value Ref Range    GGT 38 7 - 50 U/L   ECG 12-LEAD   Result Value Ref Range    Ventricular rate 74 BPM    Atrial Rate 74 BPM    PR Interval 144 ms    QRS Duration 90 ms  QT Interval 390 ms    QTC Calculation 432 ms    Calculated P Axis 35 degrees    Calculated R Axis 27 degrees    Calculated T Axis 60 degrees   POC BLOOD GLUCOSE (RESULTS)   Result Value Ref Range    GLUCOSE, POC 127 (H) 70 - 105 mg/dl   PT/INR   Result Value Ref Range    PROTHROMBIN TIME 13.2 9.5 - 14.1 seconds    INR 1.12 0.80 - 1.20   AMMONIA   Result Value Ref Range    AMMONIA 50 15 - 50 umol/L   LACTIC ACID LEVEL   Result Value Ref Range    LACTIC ACID 1.5 0.5 - 2.2 mmol/L   VENOUS BLOOD GAS/LACTATE/CO-OX/LYTES (NA/K/CA/CL/GLUC)   Result Value Ref Range    %FIO2 (VENOUS) 21.0 %    PH (VENOUS) 7.35 7.31 - 7.41    PCO2 (VENOUS) 39.00 (L) 41.00 - 51.00 mm/Hg    PO2 (VENOUS) 56.0 (H) 35.0 - 50.0 mm/Hg    BASE DEFICIT 3.8 (H) -3.0 - 3.0 mmol/L    BICARBONATE (VENOUS) 21.7 (L) 22.0 - 26.0 mmol/L    SODIUM 137 137 - 145 mmol/L    WHOLE BLOOD POTASSIUM 5.1 (H) 3.5 - 4.6 mmol/L    CHLORIDE 112 (H) 101 - 111 mmol/L    IONIZED CALCIUM 1.21 1.10 - 1.35 mmol/L    GLUCOSE 124 (H) 60 - 105 mg/dL    LACTATE 1.4 (H) 0.0 - 1.3 mmol/L    HEMOGLOBIN 12.0 12.0 - 18.0 g/dL    OXYHEMOGLOBIN 86.7 (H) 40.0 - 80.0 %    CARBOXYHEMOGLOBIN 1.4 0.0 - 2.5 %    MET-HEMOGLOBIN 0.8 0.0 - 2.0 %    O2CT 14.6 6.7 - 17.5 %   H & H   Result Value Ref Range    HGB 12.8 (L) 13.4 - 17.5 g/dL    HCT 38.2 (L) 38.9 - 52.0 %   BASIC METABOLIC PANEL   Result Value Ref Range    SODIUM 139 136 - 145 mmol/L    POTASSIUM 6.0 (HH) 3.5 - 5.1 mmol/L     CHLORIDE 113 (H) 96 - 111 mmol/L    CO2 TOTAL 20 (L) 22 - 32 mmol/L    ANION GAP 6 4 - 13 mmol/L    CALCIUM 7.8 (L) 8.5 - 10.2 mg/dL    GLUCOSE 111 65 - 139 mg/dL    BUN 20 8 - 25 mg/dL    CREATININE 1.21 0.62 - 1.27 mg/dL    BUN/CREA RATIO 17 6 - 22    ESTIMATED GFR >60 >60 mL/min/1.19m2   MAGNESIUM   Result Value Ref Range    MAGNESIUM 1.6 1.6 - 2.6 mg/dL   PHOSPHORUS   Result Value Ref Range    PHOSPHORUS 2.1 (L) 2.4 - 4.7 mg/dL   POTASSIUM   Result Value Ref Range    POTASSIUM 4.6 3.5 - 5.1 mmol/L   H & H   Result Value Ref Range    HGB 14.9 13.4 - 17.5 g/dL    HCT 44.8 38.9 - 52.0 %   H & H   Result Value Ref Range    HGB 12.6 (L) 13.4 - 17.5 g/dL    HCT 37.4 (L) 38.9 - 52.0 %   H & H   Result Value Ref Range    HGB 11.8 (L) 13.4 - 17.5 g/dL    HCT 34.7 (L) 38.9 - 52.0 %  Imaging Studies:  CT  angio of the abdomen revealed no evidence of active hemorrhage.  Examination of the bowel showed postsurgical changes in the rectosigmoid colon with a primary end-to-end anastomosis.  Otherwise unremarkable.    Jacquiline Doe, MD  Fellow, Section of Digestive Diseases  Pager:[phone]  [facility]      I saw and examined the patient.  I reviewed the resident's note.  I agree with the findings and plan of care as documented in the resident's note.  Any exceptions/additions are edited/noted. This note serves as documentation for service provided on 11/23/17    Vella Raring, MD

## 2017-11-23 NOTE — ED Nurses Note (Signed)
ED physicians at bedside

## 2017-11-24 ENCOUNTER — Encounter (HOSPITAL_COMMUNITY): Payer: Self-pay | Admitting: Anesthesiology

## 2017-11-24 ENCOUNTER — Encounter (HOSPITAL_COMMUNITY)
Admission: EM | Disposition: A | Discharge status: Home / Self Care | Payer: Self-pay | Source: Home / Self Care | Attending: Internal Medicine

## 2017-11-24 ENCOUNTER — Inpatient Hospital Stay (HOSPITAL_COMMUNITY): Payer: Medicare PPO | Admitting: Anesthesiology

## 2017-11-24 DIAGNOSIS — R569 Unspecified convulsions: Secondary | ICD-10-CM

## 2017-11-24 DIAGNOSIS — N400 Enlarged prostate without lower urinary tract symptoms: Secondary | ICD-10-CM

## 2017-11-24 DIAGNOSIS — K921 Melena: Secondary | ICD-10-CM

## 2017-11-24 DIAGNOSIS — K922 Gastrointestinal hemorrhage, unspecified: Secondary | ICD-10-CM

## 2017-11-24 DIAGNOSIS — K5730 Diverticulosis of large intestine without perforation or abscess without bleeding: Secondary | ICD-10-CM

## 2017-11-24 DIAGNOSIS — E785 Hyperlipidemia, unspecified: Secondary | ICD-10-CM

## 2017-11-24 DIAGNOSIS — E039 Hypothyroidism, unspecified: Secondary | ICD-10-CM

## 2017-11-24 DIAGNOSIS — Z952 Presence of prosthetic heart valve: Secondary | ICD-10-CM

## 2017-11-24 DIAGNOSIS — I10 Essential (primary) hypertension: Secondary | ICD-10-CM

## 2017-11-24 LAB — CBC WITH DIFF
BASOPHIL #: <0.1 x10ˆ3/uL (ref <=0.20)
BASOPHIL %: 0 %
EOSINOPHIL #: <0.1 10*3/uL (ref <=0.50)
EOSINOPHIL %: 0 %
HCT: 30.9 % — ABNORMAL LOW (ref 38.9–52.0)
HGB: 10.2 g/dL — ABNORMAL LOW (ref 13.4–17.5)
IMMATURE GRANULOCYTE #: <0.1 x10ˆ3/uL (ref <0.10)
IMMATURE GRANULOCYTE %: 1 % (ref 0–1)
LYMPHOCYTE #: 1.29 x10ˆ3/uL (ref 1.00–4.80)
LYMPHOCYTE %: 13 %
MCH: 31.3 pg (ref 26.0–32.0)
MCHC: 33 g/dL (ref 31.0–35.5)
MCV: 94.8 fL (ref 78.0–100.0)
MONOCYTE #: 0.85 x10ˆ3/uL (ref 0.20–1.10)
MONOCYTE %: 9 %
NEUTROPHIL #: 7.62 10*3/uL (ref 1.50–7.70)
NEUTROPHIL %: 77 %
PLATELETS: 90 x10ˆ3/uL — ABNORMAL LOW (ref 150–400)
RBC: 3.26 x10ˆ6/uL — ABNORMAL LOW (ref 4.50–6.10)
RDW-CV: 13.8 % (ref 11.5–15.5)
WBC: 9.9 x10ˆ3/uL (ref 3.7–11.0)

## 2017-11-24 LAB — BASIC METABOLIC PANEL
ANION GAP: 5 mmol/L (ref 4–13)
BUN/CREA RATIO: 17 (ref 6–22)
BUN: 21 mg/dL (ref 8–25)
CALCIUM: 8 mg/dL — ABNORMAL LOW (ref 8.5–10.2)
CHLORIDE: 109 mmol/L (ref 96–111)
CO2 TOTAL: 26 mmol/L (ref 22–32)
CREATININE: 1.27 mg/dL (ref 0.62–1.27)
ESTIMATED GFR: 58 mL/min/{1.73_m2} — ABNORMAL LOW (ref >60)
GLUCOSE: 105 mg/dL (ref 65–139)
POTASSIUM: 4 mmol/L (ref 3.5–5.1)
SODIUM: 140 mmol/L (ref 136–145)

## 2017-11-24 LAB — MAGNESIUM: MAGNESIUM: 1.8 mg/dL (ref 1.6–2.6)

## 2017-11-24 LAB — CROSSMATCH RED CELLS - UNITS
UNIT DIVISION: 0
UNIT DIVISION: 0

## 2017-11-24 LAB — H & H
HCT: 27.4 % — ABNORMAL LOW (ref 38.9–52.0)
HCT: 28.2 % — ABNORMAL LOW (ref 38.9–52.0)
HCT: 34.7 % — ABNORMAL LOW (ref 38.9–52.0)
HCT: 37.4 % — ABNORMAL LOW (ref 38.9–52.0)
HCT: 44.8 % (ref 38.9–52.0)
HGB: 11.8 g/dL — ABNORMAL LOW (ref 13.4–17.5)
HGB: 12.6 g/dL — ABNORMAL LOW (ref 13.4–17.5)
HGB: 14.9 g/dL (ref 13.4–17.5)
HGB: 9 g/dL — ABNORMAL LOW (ref 13.4–17.5)
HGB: 9.4 g/dL — ABNORMAL LOW (ref 13.4–17.5)

## 2017-11-24 LAB — PT/INR
INR: 1.18 (ref 0.80–1.20)
PROTHROMBIN TIME: 13.9 s (ref 9.5–14.1)

## 2017-11-24 LAB — HEPATIC FUNCTION PANEL
ALBUMIN: 2.9 g/dL — ABNORMAL LOW (ref 3.5–5.0)
ALKALINE PHOSPHATASE: 77 U/L (ref 45–115)
ALT (SGPT): 17 U/L (ref <55)
AST (SGOT): 21 U/L (ref 8–48)
BILIRUBIN DIRECT: 0.1 mg/dL (ref <0.3)
BILIRUBIN TOTAL: 0.3 mg/dL (ref 0.3–1.3)
PROTEIN TOTAL: 5.2 g/dL — ABNORMAL LOW (ref 6.4–8.3)

## 2017-11-24 LAB — TYPE AND SCREEN
ABO/RH(D): O POS
ANTIBODY SCREEN: NEGATIVE
UNITS ORDERED: 2

## 2017-11-24 LAB — TROPONIN-I
TROPONIN I: 106 ng/L — ABNORMAL HIGH (ref 0–30)
TROPONIN I: 101 ng/L — ABNORMAL HIGH (ref 0–30)

## 2017-11-24 LAB — PHOSPHORUS: PHOSPHORUS: 3.8 mg/dL (ref 2.4–4.7)

## 2017-11-24 LAB — LACTIC ACID LEVEL: LACTIC ACID: 2.1 mmol/L (ref 0.5–2.2)

## 2017-11-24 SURGERY — COLONOSCOPY
Anesthesia: Monitor Anesthesia Care | Site: Anus | Wound class: Clean Contaminated Wounds-The respiratory, GI, Genital, or urinary

## 2017-11-24 MED ORDER — ONDANSETRON HCL (PF) 4 MG/2 ML INJECTION SOLUTION
4.00 mg | INTRAMUSCULAR | Status: AC
Start: 2017-11-24 — End: 2017-11-24

## 2017-11-24 MED ORDER — PROPOFOL 10 MG/ML IV BOLUS
INJECTION | Freq: Once | INTRAVENOUS | Status: DC | PRN
Start: 2017-11-24 — End: 2017-11-24
  Administered 2017-11-24: 100 mg via INTRAVENOUS

## 2017-11-24 MED ORDER — ONDANSETRON HCL (PF) 4 MG/2 ML INJECTION SOLUTION
INTRAMUSCULAR | Status: AC
Start: 2017-11-24 — End: 2017-11-24
  Administered 2017-11-24: 4 mg via INTRAVENOUS
  Filled 2017-11-24: qty 2

## 2017-11-24 MED ORDER — PANTOPRAZOLE 40 MG TABLET,DELAYED RELEASE
40.00 mg | DELAYED_RELEASE_TABLET | Freq: Two times a day (BID) | ORAL | Status: DC
Start: 2017-11-24 — End: 2017-12-01
  Administered 2017-11-24 – 2017-12-01 (×14): 40 mg via ORAL
  Filled 2017-11-24 (×15): qty 1

## 2017-11-24 MED ORDER — AMLODIPINE 10 MG TABLET
10.0000 mg | ORAL_TABLET | Freq: Every day | ORAL | Status: DC
Start: 2017-11-24 — End: 2017-11-25
  Administered 2017-11-24: 0 mg via ORAL
  Administered 2017-11-25: 10 mg via ORAL
  Filled 2017-11-24 (×2): qty 1

## 2017-11-24 MED ORDER — LACTATED RINGERS INTRAVENOUS SOLUTION
INTRAVENOUS | Status: DC
Start: 2017-11-24 — End: 2017-11-24
  Administered 2017-11-24: 0 via INTRAVENOUS

## 2017-11-24 MED ORDER — TAMSULOSIN 0.4 MG CAPSULE
0.40 mg | ORAL_CAPSULE | Freq: Every evening | ORAL | Status: DC
Start: 2017-11-24 — End: 2017-12-01
  Administered 2017-11-24 – 2017-11-27 (×4): 0.4 mg via ORAL
  Administered 2017-11-28 – 2017-11-29 (×2): 0 mg via ORAL
  Administered 2017-11-30: 0.4 mg via ORAL
  Filled 2017-11-24 (×5): qty 1

## 2017-11-24 MED ORDER — AMITRIPTYLINE 10 MG TABLET
10.00 mg | ORAL_TABLET | Freq: Every evening | ORAL | Status: DC
Start: 2017-11-24 — End: 2017-12-01
  Administered 2017-11-24 – 2017-11-30 (×7): 10 mg via ORAL
  Filled 2017-11-24 (×8): qty 1

## 2017-11-24 MED ORDER — PROPOFOL 10 MG/ML INTRAVENOUS EMULSION
INTRAVENOUS | Status: DC | PRN
Start: 2017-11-24 — End: 2017-11-24
  Administered 2017-11-24: 100 ug/kg/min via INTRAVENOUS
  Administered 2017-11-24: 200 ug/kg/min via INTRAVENOUS
  Administered 2017-11-24: 0 ug/kg/min via INTRAVENOUS

## 2017-11-24 MED ORDER — SODIUM PHOSPHATE 3 MMOL/ML INTRAVENOUS SOLUTION
20.0000 mmol | Freq: Once | INTRAVENOUS | Status: AC
Start: 2017-11-24 — End: 2017-11-24
  Administered 2017-11-24: 0 mmol via INTRAVENOUS
  Administered 2017-11-24: 20 mmol via INTRAVENOUS
  Filled 2017-11-24 (×2): qty 6.67

## 2017-11-24 MED ORDER — LOSARTAN 50 MG TABLET
50.0000 mg | ORAL_TABLET | Freq: Every day | ORAL | Status: DC
Start: 2017-11-24 — End: 2017-11-25
  Administered 2017-11-24: 0 mg via ORAL
  Administered 2017-11-25: 50 mg via ORAL
  Filled 2017-11-24 (×2): qty 1

## 2017-11-24 MED ORDER — MAGNESIUM SULFATE 2 GRAM/50 ML (4 %) IN WATER INTRAVENOUS PIGGYBACK
2.0000 g | INJECTION | Freq: Once | INTRAVENOUS | Status: AC
Start: 2017-11-24 — End: 2017-11-24
  Administered 2017-11-24: 09:00:00 2 g via INTRAVENOUS
  Administered 2017-11-24: 0 g via INTRAVENOUS
  Filled 2017-11-24: qty 50

## 2017-11-24 MED ORDER — LEVOTHYROXINE 100 MCG TABLET
100.00 ug | ORAL_TABLET | Freq: Every morning | ORAL | Status: DC
Start: 2017-11-25 — End: 2017-12-01
  Administered 2017-11-25 – 2017-12-01 (×7): 100 ug via ORAL
  Filled 2017-11-24 (×7): qty 1

## 2017-11-24 MED ORDER — PSYLLIUM ORAL PACKET WRAPPER
1.0000 | PACK | Freq: Two times a day (BID) | Status: DC
Start: 2017-11-24 — End: 2017-12-01
  Administered 2017-11-24 – 2017-12-01 (×14): 1 via ORAL
  Filled 2017-11-24 (×15): qty 1

## 2017-11-24 MED ORDER — LEVETIRACETAM 500 MG TABLET
500.00 mg | ORAL_TABLET | Freq: Two times a day (BID) | ORAL | Status: DC
Start: 2017-11-24 — End: 2017-12-01
  Administered 2017-11-24 – 2017-12-01 (×14): 500 mg via ORAL
  Filled 2017-11-24 (×14): qty 1

## 2017-11-24 MED ORDER — DOCUSATE SODIUM 100 MG CAPSULE
100.00 mg | ORAL_CAPSULE | Freq: Two times a day (BID) | ORAL | Status: DC
Start: 2017-11-24 — End: 2017-11-24

## 2017-11-24 MED ORDER — LIDOCAINE (PF) 100 MG/5 ML (2 %) INTRAVENOUS SYRINGE
INJECTION | Freq: Once | INTRAVENOUS | Status: DC | PRN
Start: 2017-11-24 — End: 2017-11-24
  Administered 2017-11-24: 100 mg via INTRAVENOUS

## 2017-11-24 MED ORDER — CALCIUM 200 MG (AS CITRATE)-VITAMIN D3 6.25 MCG (250 UNIT) TABLET
1.0000 | ORAL_TABLET | Freq: Every day | ORAL | Status: DC
Start: 2017-11-24 — End: 2017-12-01
  Administered 2017-11-24 – 2017-12-01 (×8): 1 via ORAL
  Filled 2017-11-24 (×8): qty 1

## 2017-11-24 MED ORDER — PHENYLEPHRINE 0.5 MG/5 ML (100 MCG/ML)IN 0.9 % SOD.CHLORIDE IV SYRINGE
INJECTION | Freq: Once | INTRAVENOUS | Status: DC | PRN
Start: 2017-11-24 — End: 2017-11-24
  Administered 2017-11-24: 100 ug via INTRAVENOUS

## 2017-11-24 MED ORDER — MELATONIN 3 MG TABLET
3.00 mg | ORAL_TABLET | Freq: Every evening | ORAL | Status: DC | PRN
Start: 2017-11-24 — End: 2017-12-01
  Administered 2017-11-24 – 2017-11-30 (×6): 3 mg via ORAL
  Filled 2017-11-24 (×6): qty 1

## 2017-11-24 MED ORDER — MULTIVITAMIN TABLET
1.0000 | ORAL_TABLET | Freq: Every day | ORAL | Status: DC
Start: 2017-11-24 — End: 2017-12-01
  Administered 2017-11-24 – 2017-12-01 (×9): 1 via ORAL
  Filled 2017-11-24 (×8): qty 1

## 2017-11-24 MED ADMIN — tamsulosin 0.4 mg capsule: ORAL | @ 18:00:00

## 2017-11-24 MED ADMIN — losartan 50 mg tablet: ORAL | @ 18:00:00

## 2017-11-24 MED ADMIN — PAPAVERINE 30 MG/ML IN NS TOPICAL SOLUTION: ORAL | @ 22:00:00 | NDC 00517400225

## 2017-11-24 MED ADMIN — erythromycin 5 mg/gram (0.5 %) eye ointment: INTRAVENOUS | @ 17:00:00 | NDC 17478007031

## 2017-11-24 SURGICAL SUPPLY — 89 items
ATTACHMENT ENDOSCP 4MM 13.4MM RND EDGE STRL LF  DISP (GENE) IMPLANT
ATTACHMENT ENDOSCP 4MM 13.4MM_RND EDG STRL LF DISP (GENE)
ATTACHMENT ENDOSCP 4MM 15MM RND EDGE STRL LF  DISP (ENDOSCOPIC SUPPLIES) IMPLANT
ATTACHMENT ENDOSCP 4MM 15MM RN_D EDG STRL LF DISP (INSTRUMENTS ENDOMECHANICAL)
CATH CRE 10-11-12MM 7.5FR 5.5CM 240CM 2.8MM 3.2MM LOW PROF GW BAL DIL ESOPH PYL BIL PEBAX STRL LF (BALLOON) IMPLANT
CATH ELHMST GLD PRB 10FR 300CM_BIPO RND DIST TIP STD CONN (DIAGNOSTIC)
CATH ELHMST GLD PROBE 10FR 300CM BIPOLAR RND DIST TIP STD CONN FIRM SHAFT HMGLD STRL DISP 3.7MM MN (DIAGNOSTIC) IMPLANT
CATH ELHMST GLD PROBE 7FR 300CM BIPOLAR RND DIST TIP STD CONN FIRM SHAFT HMGLD STRL DISP 2.8MM MN (DIAGNOSTIC) IMPLANT
CATH ELHMST GLD PROBE 7FR 300C_M BIPOLAR RND DIST TIP STD (DIAGNOSTIC)
CLIP HMST MR CONDITIONAL BRD CATH ROT CONTROL KNOB NO SHEATH RSL 360 235CM 2.8MM 11MM OPN (SURGICAL INSTRUMENTS) IMPLANT
CONV USE ITEM 343591 - SOLIDIFY FLUID 1500ML DSPNSR L_Q TX SOLIDIFY SFTP LTS+ DISP (STER) ×1 IMPLANT
DEVICE HEMOSTASIS CLIP 360_235CM RESOLUTION (INSTRUMENTS)
DILATOR ENDOS CRE 240CM 5.5CM 11-13.5-15MM 7.5FR ESOPH PYL BIL BAL LOW PROF GW PEBAX STRL LF  DISP (BALLOON) IMPLANT
DILATOR ENDOS CRE 240CM 5.5CM 15-16.5-18MM 7.5FR ESOPH PYL BIL BAL LOW PROF GW PEBAX STRL LF  DISP (BALLOON) IMPLANT
DILATOR ENDOS CRE 240CM 5.5CM_11-13.5-15MM 7.5FR ESOPH PYL (BALLOON)
DILATOR ENDOS CRE 240CM 5.5CM_15-16.5-18MM 7.5FR ESOPH PYL (BALLOON)
DILATOR HERCULES 10-11-12_M00558680 EA (BALLOON)
DISC USE ITEM 309153 - TRAP SPECI ARGYLE 40CC GRAD SC_REW ON CAP REM MALE CONN (Cautery Accessories)
DISCONTINUED USE ITEM 309153 - TRAP SPECI ARGYLE 40CC GRAD SC_REW ON CAP REM MALE CONN (Cautery Accessories) IMPLANT
DISCONTINUED USE ITEM 339015 - CONTAINR STRL 10% NEUT BF FRMLN POLYPROP GRAD LEAK RST ORNG PREFL SCREW CAP FSHR HLTHCR PRTCL GRN (CHEM) IMPLANT
DISCONTINUED USE ITEM 82101 - TUBING OXYGEN 50/CS 001302 (TUBE/TUBING & SUCTION SUPPLIES) IMPLANT
ELECTRODE ESURG 165CM ITKNIFE NANO STRL CERM 3.5MM DISP INSL TIP LF  ACPT 2.8MM CHNL ENDOS (ENDOSCOPIC SUPPLIES) IMPLANT
ELECTRODE ESURG 165CM TRIANGLETIP KNIFE STRL CERM 4.5MM DISP LF  ACPT 2.8MM CHNL ENDOS SUBMUCOSAL (ENDOSCOPIC SUPPLIES) IMPLANT
ELECTRODE ESURG 230CM DUALKNIFE J STRL CERM 1.5MM DISP BUIL IN HNDL FLUID INJ LF  ACPT 2.8MM CHNL (Cautery Accessories) IMPLANT
ELECTRODE ESURG 230CM ITKNIFE NANO STRL CERM 3.5MM DISP INSL TIP LF  ACPT 2.8MM CHNL ENDOS (CAUTERY SUPPLIES) IMPLANT
ELECTRODE PATIENT RTN 9FT VLAB C30- LB RM PHSV ACRL FOAM CORD NONIRRITATE NONSENSITIZE ADH STRP (CAUTERY SUPPLIES) IMPLANT
ELECTRODE PATIENT RTN 9FT VLAB_REM C30- LB PLHSV ACRL FOAM (CAUTERY SUPPLIES)
FORCEPS BIOPSY 240CM 2.4MM RJ_4 2.8MM LRG CPC DISP ORNG (GUIDING)
FORCEPS BIOPSY MICROMESH TTH STREAMLINE CATH 240CM 2.4MM RJ 4 SS LRG CPC STRL DISP ORNG 2.8MM WRK (GUIDING) IMPLANT
FORCEPS BIOPSY NEEDLE 160CM 1.8MM RJ 4 DISP YW 2MM WRK CHNL GSPED (SURGICAL INSTRUMENTS) IMPLANT
FORCEPS BIOPSY NEEDLE 160CM 1._8MM RJ 4 PED 2+ MM DISP (INSTRUMENTS)
FORCEPS BIOPSY NEEDLE 240CM 2.2MM RJ 4 2.8MM STD CPC STRL DISP ORNG (SURGICAL INSTRUMENTS) IMPLANT
FORCEPS BIOPSY NEEDLE 240CM 2._2MM RJ 4 2.8MM STD CPC STRL (INSTRUMENTS)
FORCEPS ENDOS 240CM 2.8MM RJ 4 JMB DISP (SURGICAL INSTRUMENTS) IMPLANT
FORCEPS ENDOS 240CM 2.8MM RJ 4_JMB DISP (INSTRUMENTS)
FORCEPS ENDOS HMST GRSPR MONOP_OL 165CM 2.75MM COAGRASPER 2.8 (INSTRUMENTS ENDOMECHANICAL)
FORCEPS ESURG 165X5MM COAGRASPER HMST STRL LF  DISP (ENDOSCOPIC SUPPLIES) IMPLANT
FORCEPS ESURG 230X4MM COAGRASPER HMST STRL LF  DISP (ENDOSCOPIC SUPPLIES) IMPLANT
FORCEPS MONOPOL 230X4MM 2.75MM_3.2MM COAGRASPER HMSTS STRL (INSTRUMENTS ENDOMECHANICAL)
FORMALIN 10% BUFF 8ML_23032059 100EA/PK (CHEM)
GOWN SURG XL AAMI L3 NONREINFO_RCE HKLP CLSR STRL LTX PNK SMS (DGOW)
GOWN SURG XL L3 NONREINFORCE HKLP CLSR STRL LTX PNK SMS 47IN (DGOW) IMPLANT
JELLY LUB PDI BCTRST H2O SOL N ONSTAIN NGRS STRL GLYC MTHY (INSTRUMENTS) ×2
JELLY LUB PDI BCTRST H2O SOL N ONSTAIN NGRS STRL GLYC MTHY (SURGICAL INSTRUMENTS) ×2 IMPLANT
KIT ENDOS ENDOZIME BDSD SLR SPONGE ENZM DETERGENT 500ML (DIS) ×1 IMPLANT
KIT ENDOS ENZM BDSD SLR SPONGE_ENZM DTRG 500ML (DIS) ×1
KNIFE ENDOS 165CM 1.7MM .9MM I_TKNIFE NANO CERM SM INSL TIP (INSTRUMENTS ENDOMECHANICAL)
KNIFE ESURG 165CM 2.8MM 3ANG T_IP 4.5MM STRL DISP (INSTRUMENTS ENDOMECHANICAL)
KNIFE ESURG 2300MM 2.8MM DUALK_NIFEJ BUIL IN HNDL FLUID INJ (Cautery Accessories)
KNIFE ESURG 230CM 1.7MM .9MM I_TKNIFE NANO SHORT LGTH CERM (CAUTERY SUPPLIES)
LIGATOR 2300MM 30MM PLLP PRELD_DEV INT HNDL DISP ENDOS (INSTRUMENTS ENDOMECHANICAL)
LIGATOR DEV STRL DISP ENDOS LF (ENDOSCOPIC SUPPLIES) IMPLANT
LINER SUCT RD CRD MEDIVAC TW LOCK LID SHTOF VALVE CAN FILTER 1500CC LF  DISP (Suction) ×1 IMPLANT
LINER SUCT RD CRD MEDIVAC TW L_OCK LID SHTOF VALVE CAN FLTR (Suction) ×1
LOOP AUTOCLAV HLDR DTCH 30MM S_URG NYL PLPCTM NONST DISP (INSTRUMENTS ENDOMECHANICAL)
LOOP HLDR DTCH AUTOCLAV 30MM SURG NYL PLPCTM NONST LF  DISP (ENDOSCOPIC SUPPLIES) IMPLANT
MARKER ENDOS SPOT EX PERM IND DRK SYRG 5ML (GENE) IMPLANT
MARKER ENDOS SPOT EX PREFL PRE_ASSEMBLE SYRG PERM (GENE)
NEEDLE ENDOS 5MM 25GA 2.5MM SS TFLN 230CM INJ PRJ SHEATH LL SPRG LD HNDL CRLK STRL LF  DISP (NEEDLES & SYRINGE SUPPLIES) IMPLANT
NEEDLE ENDOS 5MM 25GA 2.5MM SS_TFLN 230CM INJ PRJ SHTH LL (NEEDLES & SYRINGE SUPPLIES)
NEEDLE SCLRTX 25GA 2.3MM OPTC TIP SHTH STRL LF DISP YW (NEEDLES & SYRINGE SUPPLIES) IMPLANT
NET SPEC RETR 160CM 3MM RTHNT MAXI SHEATH 8X4CM NONST LF  DISP (Dilators) IMPLANT
NET SPEC RETR 160MM 1.8MM RTHN_T MINI 4.5X2CM PED NONST LF (Dilators)
NET SPEC RETR 8X4CM 3MM RTHNT_MAXI 160CM NONST LF DISP (Dilators)
PROBE COAG 7.2FT 6.9FR FIAPC CRCMF PLUG PLAY FUNCTIONALITY FILTER (ENDOSCOPIC SUPPLIES) IMPLANT
PROBE ESURG 220CM 2.3MM FIAPC FLXB STR FIRE STRL DISP (CAUTERY SUPPLIES) IMPLANT
PROBE ESURG 220CM 2.3MM FIAPC_FLXB STR FIRE ARGON PLAS COAG (CAUTERY SUPPLIES)
PROBE ESURG 7.2FT 6.9FR FIAPC_CRCMF PLUG PLAY FUNCTIONALITY (INSTRUMENTS ENDOMECHANICAL)
RETRIEVER 230CM 2.5MM ALT WV N_T ENDOS NONST RTHNT 6X3CM (INSTRUMENTS ENDOMECHANICAL)
RETRIEVER ENDOS 160CM 1.8MM RTHNT MINI SM CATH SHEATH 4.5X2CM NONST (Dilators) IMPLANT
RETRIEVER ENDOS 230CM 2.5MM RTHNT ALT WV 6X3CM (ENDOSCOPIC SUPPLIES) IMPLANT
SNARE 9MM 230CM 2.4MM EXACTO COLD BRD WRE CLEAN CUT ENDOS PLC RESCT STRL LF  DISP (ENDOSCOPIC SUPPLIES) IMPLANT
SNARE 9MM 230CM 2.4MM EXACTO C_OLD BRD WRE CLEAN CUT ENDOS (INSTRUMENTS ENDOMECHANICAL)
SNARE MED OVAL 240CM 2.4MM SNS LOOP SHRTHRW FLXB ENDOS 2.8MM WRK CHNL PLPCTM 27MM STRL DISP (ENDOSCOPIC SUPPLIES) IMPLANT
SNARE MED OVAL 240CM 2.4MM SNS_LOOP SHRTHRW FLXB ENDOS 2.8MM (INSTRUMENTS ENDOMECHANICAL)
SNARE SM OVAL 240CM 2.4MM SNS LOOP SHRTHRW FLXB ENDOS PLPCTM 13MM STRL LF  DISP (DIAGNOSTIC) IMPLANT
SNARE SM OVL 240CM 2.4MM SNS L_OOP SHRTHRW FLXB ENDOS PLPCTM (DIAGNOSTIC)
SOLIDIFY FLUID 1500ML DSPNSR L_Q TX SOLIDIFY SFTP LTS+ DISP (STER) ×2 IMPLANT
SOLUTION IRRG H2O 500CC 2F7113 (SOLUTIONS) ×1
SYRINGE INFLAT ALN II GA STRL DISP 60ML (NEEDLES & SYRINGE SUPPLIES) IMPLANT
SYRINGE INFLAT ALN II GA STRL_DISP 60ML (NEEDLES & SYRINGE SUPPLIES)
SYRINGE LL 50ML LF  STRL GRAD N-PYRG DEHP-FR PVC FREE MED DISP CLR (NEEDLES & SYRINGE SUPPLIES) IMPLANT
SYRINGE LL 50ML LF STRL GRAD_N-PYRG DEHP-FR PVC FREE MED (NEEDLES & SYRINGE SUPPLIES)
TRAP SPEC RETR ETRAP MAGNIFY W IND MSR GUIDE PLYP LF DISP (GI LAB SUPPLIES) IMPLANT
TUBING OXYGEN 50/CS 001302 (TUBE/TUBING & SUCTION SUPPLIES)
TUBING SUCT CLR 20FT 9/32IN MEDIVAC NCDTV M/M CONN STRL LF (Suction) ×1 IMPLANT
TUBING SUCT CONN 20FT LONG_STRL N720A (Suction) ×1
USE ITEM 43607 SNARE MED OVAL 240CM 2.4MM SNS LOOP SHRTHRW FLXB ENDOS 2.8MM WRK CHNL PLPCTM 27MM STRL DISP (ENDOSCOPIC SUPPLIES) IMPLANT
WATER STRL 500ML PLASTIC PR BTL LF (SOLUTIONS) ×1 IMPLANT

## 2017-11-24 NOTE — Anesthesia Transfer of Care (Signed)
ANESTHESIA TRANSFER OF CARE   Richard [name] is a 57 y.o. ,male, Weight: 95.8 kg (211 lb 3.2 oz)   had Procedure(s):  COLONOSCOPY  performed  11/24/17   Primary Service: Risa Grill,*    Past Medical History:   Diagnosis Date   . Anxiety    . BPH (benign prostatic hyperplasia)    . Congenital anomaly of kidney     HORSESHOE KIDNEY   . Depression    . Diarrhea     CDIFF   . Epilepsy (CMS [place])     OVER 10 YEARS SINCE LAST SEIZURE-DR WIEMER   . Esophageal reflux    . GIB (gastrointestinal bleeding) 2017   . High blood pressure    . Hx of aortic valve replacement    . Hypercholesterolemia    . Hyperlipidemia    . Hypothyroidism    . Mass of esophagus    . Nodular prostate    . Palpitations    . Pneumonia    . Testicular hypofunction       Allergy History as of 11/24/17     STATINS-HMG-COA REDUCTASE INHIBITORS       Noted Status Severity Type Reaction    08/07/17 1429 Foye Deer, RN 06/05/17 Active Low  Nausea/ Vomiting    06/05/17 1321 Hunt, Crane Ropes, LPN 16/96/78 Active                 I completed my transfer of care / handoff to the receiving personnel during which we discussed:  Access, Antibiotics, Gave opportunity for questions and acknowledgement of understanding, Analgesia, All key/critical aspects of case discussed, Fluids/Product, PMHx, Airway and Expectation of post procedure                                                                    Last OR Temp: Temperature: 36.2 C (97.2 F)  ABG:  PCO2 (VENOUS)   Date Value Ref Range Status   11/23/2017 39.00 (L) 41.00 - 51.00 mm/Hg Final     PO2 (VENOUS)   Date Value Ref Range Status   11/23/2017 56.0 (H) 35.0 - 50.0 mm/Hg Final     SODIUM   Date Value Ref Range Status   11/23/2017 137 137 - 145 mmol/L Final     POTASSIUM   Date Value Ref Range Status   11/23/2017 4.6 3.5 - 5.1 mmol/L Final     WHOLE BLOOD POTASSIUM   Date Value Ref Range Status   11/23/2017 5.1 (H) 3.5 - 4.6 mmol/L Final     CHLORIDE   Date Value Ref Range Status   11/23/2017 112 (H)  101 - 111 mmol/L Final     CALCIUM   Date Value Ref Range Status   11/23/2017 7.8 (L) 8.5 - 10.2 mg/dL Final     Calculated P Axis   Date Value Ref Range Status   11/23/2017 35 degrees Final     Calculated R Axis   Date Value Ref Range Status   11/23/2017 27 degrees Final     Calculated T Axis   Date Value Ref Range Status   11/23/2017 60 degrees Final     IONIZED CALCIUM   Date Value Ref Range Status   11/23/2017 1.21 1.10 - 1.35 mmol/L  Final     LACTATE   Date Value Ref Range Status   11/23/2017 1.4 (H) 0.0 - 1.3 mmol/L Final     HEMOGLOBIN   Date Value Ref Range Status   11/23/2017 12.0 12.0 - 18.0 g/dL Final     OXYHEMOGLOBIN   Date Value Ref Range Status   11/23/2017 86.7 (H) 40.0 - 80.0 % Final     CARBOXYHEMOGLOBIN   Date Value Ref Range Status   11/23/2017 1.4 0.0 - 2.5 % Final     MET-HEMOGLOBIN   Date Value Ref Range Status   11/23/2017 0.8 0.0 - 2.0 % Final     BASE DEFICIT   Date Value Ref Range Status   11/23/2017 3.8 (H) -3.0 - 3.0 mmol/L Final     BICARBONATE (VENOUS)   Date Value Ref Range Status   11/23/2017 21.7 (L) 22.0 - 26.0 mmol/L Final     %FIO2 (VENOUS)   Date Value Ref Range Status   11/23/2017 21.0 % Final     Airway:* No LDAs found *  Blood pressure 112/71, pulse 83, temperature 36.2 C (97.2 F), resp. rate 12, height 1.753 m ('5\' 9"'$ ), weight 95.8 kg (211 lb 3.2 oz), SpO2 96 %.

## 2017-11-24 NOTE — Care Plan (Signed)
Richard [name] resides in a multi-level home with his wife, Richard [name]. He has access to a first floor set-up and normally does not require use of the stairs. However, he notes that he was functionally independent and if required, could navigate the stairs. He reports that he independently drives and navigates the community.     Richard [name] does not have MPOA paperwork on file at this time. He reports that he has not completed this. His sister is familiar with completing these forms and notes that he will meet with her when he gets home to complete them. He is aware that if he changes his mind during this hospitalization, Care Management can assist with completing these documents.     Richard [name] is for colonoscopy today.     MSW will continue to follow.

## 2017-11-24 NOTE — Progress Notes (Signed)
Progress Note    Richard [name] 57 y.o. male Room: 17/A    Date of Service: 11/24/2017 Date of Admission:  11/23/2017   Hospital Day:  LOS: 1 day  Full Code     Subjective: Richard [name] returned from his colonoscopy and was sitting in a chair inside the bathroom. He had another witnessed bloody stool. Bright red blood without pain. Patient was pale and lightheaded.     Denies fever, chills, SOB, chest pain, abdominal pain or N/V.     Objective:  Filed Vitals:    11/24/17 1130 11/24/17 1136 11/24/17 1153 11/24/17 1203   BP: 118/68 95/64 108/71 112/71   Pulse: 64 90 84 83   Resp: 18 18 16 12    Temp: 36.4 C (97.5 F) 36.4 C (97.5 F) 36.3 C (97.3 F) 36.2 C (97.2 F)   SpO2: 97% 95% 98% 96%     Constitutional: Pleasant 57 y.o. male appears in acute distress   HENT:   Head: Normocephalic and atraumatic   Mouth/Throat: Oropharynx is clear and moist   Eyes: EOMI, PERRL  Neck:  Trachea midline, neck supple  Cardiovascular: RRR, intact distal pulses  Pulmonary/Chest: CTAB  Abdominal:BS +ve, abdomen soft non-tender with no rebound or guarding  Musculoskeletal: No edema, tenderness or deformity  Skin: Cold, pale and dry, no rash, erythema, pallor or cyanosis  Psychiatric: Normal mood and affect  Neurological: Patient keenly alert and responsive, CN II-XII grossly intact    Data Reviewed: I have reviewed and interpreted all labs, images, and tests    Current Facility-Administered Medications:  LR premix infusion  Intravenous Continuous   NS flush syringe 2 mL Intracatheter Q8HRS   And      NS flush syringe 2-6 mL Intracatheter Q1 MIN PRN   sodium phosphate 20 mmol in D5W 100 mL IVPB 20 mmol Intravenous Once     Assessment/Plan:  This is a 57 y.o. 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 CT angio of the abdomen revealed no  evidence of active hemorrhage.     Hospital Problems (* Primary Problem)    Diagnosis Date Noted   . *Acute GI bleeding 11/23/2017      Resolved Hospital Problems   No resolved problems to display.     Plan:    Lower GI bleeding  Underwent colonoscopy today  Diverticulosis found, waiting for final report from GI  Bleeding has resolved. Patient's vital are stable.     Seizures:  - Past h/o seizures since 2000  - on Keppra 500mg  BID    Aortic valve replacement:  - Done 10 years ago, bioprosthetic valve.   - According to patient last stress test and TTE was a month ago and were fine.   - Chart review shows a TTE done in 8/19 that shows an EF of 60-70% with moderate stenosis of bioprosthetic aortic valve with mean gradient 36 mmHg.     Hypertension:  - Amlodepine 10mg  QD  - Losartan 50 mg QD    Dyslipidemias:  - h/o intolerance to statins  - on Fish oil at home    BPH:  - Flomax 0.4mg  oral qd    Hypothyroidism;   - Synthroid 186mcg Oral Qd    Consults: GI for further evaluation of an upper GI bleed.   PT/OT: Yes  DVT/PE Prophylaxis:  SCDs/ Venodynes/Impulse boots  Disposition Planning: Home discharge      Creola Corn, MD      I saw and examined the patient.  I reviewed the resident's note.  I agree with the findings and plan of care as documented in the resident's note.  Any exceptions/additions are edited/noted.  Per GI, significant diverticulosis with clotted blood in diverticula. Will continue to have blood bowel movements and likely drop in Hgb. Recommend trending H/H, transfuse for Hgb<7 and high fiber diet.  Will transfer to step-down for close vital sign monitoring.  [name], MD

## 2017-11-24 NOTE — Anesthesia Preprocedure Evaluation (Addendum)
ANESTHESIA PRE-OP EVALUATION  Planned Procedure: COLONOSCOPY (N/A Anus)      Richard [name] is a 57 y.o. male who presented to ED today after what he described as large volume bright red blood in stool.  He reports that he was also having N/V and dizziness when the bleeding began and actually had an episode of syncope today.  He has had GI bleed before in 2017 after partial colectomy; denies having any problems with bleeding ever since; not on any home blood thinners.  After the syncopal episode he reported to the ED as he still felt like he was bleeding.  Patients wife noted that he had seemed altered after the initial episode.  Initial hemoglobin within normal limits however admission to MICU was requested.  Many of patient's symptoms had resolved upon admission.    Review of Systems     anesthesia history negative     patient summary reviewed  nursing notes reviewed        Pulmonary   pneumonia,   Cardiovascular    Hypertension and ECG reviewed        GI/Hepatic/Renal   negative GI/hepatic/renal ROS,      Endo/Other   neg endo/other ROS,        Neuro/Psych/MS    seizures     Cancer  negative hematology/oncology ROS,                    Physical Assessment      Patient summary reviewed and Nursing notes reviewed   Airway       Mallampati: II    TM distance: >3 FB    Neck ROM: full  Mouth Opening: good.      No endotracheal tube present  No Tracheostomy present    Dental           (+) poor dentition           Pulmonary    Breath sounds clear to auscultation       Cardiovascular    Rhythm: regular  Rate: Normal       Other findings            Plan  Planned anesthesia type: MAC    ASA 3     Intravenous induction     Anesthetic plan and risks discussed with patient.     Anesthesia issues/risks discussed are: PONV, Aspiration, Sore Throat, Difficult Airway, Dental Injuries, Intraoperative Awareness/ Recall and Cardiac Events/MI.        Patient's NPO status is appropriate for Anesthesia.           Plan discussed with  CRNA.

## 2017-11-24 NOTE — Care Management Notes (Signed)
[facility] Initial Evaluation    Patient Name: Richard [name]  Date of Birth: September 12, 1960  Sex: male  Date/Time of Admission: 11/23/2017  4:21 PM  Room/Bed: 25/A  Payor: [place] MEDICARE / Plan: [place] MEDICARE ADVANTAGE PPO / Product Type: PPO /   Primary Care Providers:  Gavin Potters, APRN, APRN (General)    Pharmacy Info:   Preferred [facility], [place].    [address] [place] [address]    Phone: [phone] Fax: [phone]    Not a 24 hour pharmacy; exact hours not known.        Emergency Contact Info:   Extended Emergency Contact Information  Primary Emergency Contact: R[place], [place] Phone: [phone]  Relation: Wife  Preferred language: English    History:   NAMISH KRISE is a 57 y.o., male, admitted with concern for lower GI bleed.     Height/Weight: 175.3 cm (5\' 9" ) / 95.8 kg (211 lb 3.2 oz)     LOS: 1 day   Admitting Diagnosis: Acute GI bleeding [K92.2]    Assessment:      11/24/17 1005   Assessment Details   Assessment Type Admission   Date of Care Management Update 11/24/17   Date of Next DCP Update 11/27/17   Care Management Plan   Discharge Planning Status initial meeting   Projected Discharge Date 11/26/17   CM will evaluate for rehabilitation potential yes   Patient choice offered to patient/family no   Form for patient choice reviewed/signed and on chart no   Patient aware of possible cost for ambulance transport?  No   Discharge Needs Assessment   Equipment Currently Used at Home none   Equipment Needed After Discharge none   Discharge Facility/Level of Care Needs Home vs Home with Home Health   Transportation Available car;family or friend will provide   Referral Information   Admission Type inpatient   Address Verified verified-no changes   Arrived From home or self-care   Insurance Verified verified-no change   ADVANCE DIRECTIVES   Does the Patient have an  Advance Directive? No, Information Offered and Refused   Patient Requests Assistance in Having Advance Directive Notarized. N/A   LAY CAREGIVER    Appointed Lay Caregiver? I Decline   Employment/Financial   Patient has Prescription Coverage?  Yes        Name of Insurance Coverage for Medications [facility] Medicare Advantage   Financial Concerns none   Living Environment   Select an age group to open "lives with" row.  Adult   Lives With spouse   Living Arrangements house   Able to Return to Prior Arrangements yes   Home Safety   Home Assessment: Stairs in Home;No Problems Identified   Home Accessibility bed and bath on same level;no concerns;stairs within home   Legal Issues   Do you have a court appointed guardian/conservator? No     Mr. Richard [name] is a 57 year old male that presented to [facility] from home with concern for lower GI bleed. He had endorsed bright red blood in his stool and had experienced a syncopal episode. At time of presentation to ED, he reports that his symptoms have improved.     Mr. Richard [name] has a past medical history of anxiety, benign prostatic hyperplasia, kidney anomaly, depression, epilepsy (managed on Keppra), GIB, hypetension, aortic valve  replacement, hypercholesteremia, hyperlipidemia, hypothyroidism, esophageal mass, nodular prostate and testicular hypofunction. He reports that he is followed by Gavin Potters, APRN for primary care needs. If she is not available and he requires to be seen, he reports that he will see another provider within the practice. He reports that he was last seen earlier in September. He manages his medications independently at home and fills them at [facility] in [place]. His insurance remains active as [place]. He endorses no concerns with his insurance coverage.     Mr. Richard [name] denies use of DME, oxygen nor home health at this time. He notes that he has used home health in the past and it has been initiated by him "calling his insurance" to have  a nurse sent to his home. This is not current.     Mr. Richard [name] resides in a multi-level home with his wife, Richard [name]. He has access to a first floor set-up and normally does not require use of the stairs. However, he notes that he was functionally independent and if required, could navigate the stairs. He reports that he independently drives and navigates the community.     Mr. Richard [name] does not have MPOA paperwork on file at this time. He reports that he has not completed this. His sister is familiar with completing these forms and notes that he will meet with her when he gets home to complete them. He is aware that if he changes his mind during this hospitalization, Care Management can assist with completing these documents.     Mr. Richard [name] is for colonoscopy today.     MSW will continue to follow.     Discharge Plan:  Home vs home with Home Health    The patient will continue to be evaluated for developing discharge needs.     Case Manager: Jannifer Franklin, [place]  Phone: [phone]

## 2017-11-24 NOTE — Nurses Notes (Signed)
FINISHED GOLYTELY BOWEL PREP

## 2017-11-24 NOTE — Nurses Notes (Signed)
Patient transported to endo for colonoscopy procedure on bariatric stretcher with RN escort on monitor. Patient will be transferred to [facility] unit after procedure. Cell phone/charger sent with patient.

## 2017-11-24 NOTE — Ancillary Notes (Signed)
Patient discussed with MICU service for evaluation for transfer and has been assigned to Medicine 2 service at this time. Admission/transfer team will be seeing the patient shortly.

## 2017-11-24 NOTE — OR Nursing (Signed)
Tolerated procedure well. Abdomen soft on palpatation.  Report called to Erin RN in area C. Patient transported to area C with RN and CRNA.

## 2017-11-24 NOTE — Anesthesia Transfer of Care (Signed)
ANESTHESIA TRANSFER OF CARE   Richard [name] is a 57 y.o. ,male, Weight: 95.8 kg (211 lb 3.2 oz)   had Procedure(s):  COLONOSCOPY  performed  11/24/17   Primary Service: Risa Grill,*    Past Medical History:   Diagnosis Date   . Anxiety    . BPH (benign prostatic hyperplasia)    . Congenital anomaly of kidney     HORSESHOE KIDNEY   . Depression    . Diarrhea     CDIFF   . Epilepsy (CMS [place])     OVER 10 YEARS SINCE LAST SEIZURE-DR WIEMER   . Esophageal reflux    . GIB (gastrointestinal bleeding) 2017   . High blood pressure    . Hx of aortic valve replacement    . Hypercholesterolemia    . Hyperlipidemia    . Hypothyroidism    . Mass of esophagus    . Nodular prostate    . Palpitations    . Pneumonia    . Testicular hypofunction       Allergy History as of 11/24/17     STATINS-HMG-COA REDUCTASE INHIBITORS       Noted Status Severity Type Reaction    08/07/17 1429 Foye Deer, RN 06/05/17 Active Low  Nausea/ Vomiting    06/05/17 1321 Hunt, [place] Ropes, LPN 17/71/16 Active                 I completed my transfer of care / handoff to the receiving personnel during which we discussed:  Access, PMHx, Expectation of post procedure, Airway, Antibiotics, All key/critical aspects of case discussed, Fluids/Product, Gave opportunity for questions and acknowledgement of understanding and Analgesia                                                                    Last OR Temp: Temperature: 36.4 C (97.5 F)  ABG:  PCO2 (VENOUS)   Date Value Ref Range Status   11/23/2017 39.00 (L) 41.00 - 51.00 mm/Hg Final     PO2 (VENOUS)   Date Value Ref Range Status   11/23/2017 56.0 (H) 35.0 - 50.0 mm/Hg Final     SODIUM   Date Value Ref Range Status   11/23/2017 137 137 - 145 mmol/L Final     POTASSIUM   Date Value Ref Range Status   11/23/2017 4.6 3.5 - 5.1 mmol/L Final     WHOLE BLOOD POTASSIUM   Date Value Ref Range Status   11/23/2017 5.1 (H) 3.5 - 4.6 mmol/L Final     CHLORIDE   Date Value Ref Range Status   11/23/2017 112 (H)  101 - 111 mmol/L Final     CALCIUM   Date Value Ref Range Status   11/23/2017 7.8 (L) 8.5 - 10.2 mg/dL Final     Calculated P Axis   Date Value Ref Range Status   11/23/2017 35 degrees Final     Calculated R Axis   Date Value Ref Range Status   11/23/2017 27 degrees Final     Calculated T Axis   Date Value Ref Range Status   11/23/2017 60 degrees Final     IONIZED CALCIUM   Date Value Ref Range Status   11/23/2017 1.21 1.10 - 1.35 mmol/L  Final     LACTATE   Date Value Ref Range Status   11/23/2017 1.4 (H) 0.0 - 1.3 mmol/L Final     HEMOGLOBIN   Date Value Ref Range Status   11/23/2017 12.0 12.0 - 18.0 g/dL Final     OXYHEMOGLOBIN   Date Value Ref Range Status   11/23/2017 86.7 (H) 40.0 - 80.0 % Final     CARBOXYHEMOGLOBIN   Date Value Ref Range Status   11/23/2017 1.4 0.0 - 2.5 % Final     MET-HEMOGLOBIN   Date Value Ref Range Status   11/23/2017 0.8 0.0 - 2.0 % Final     BASE DEFICIT   Date Value Ref Range Status   11/23/2017 3.8 (H) -3.0 - 3.0 mmol/L Final     BICARBONATE (VENOUS)   Date Value Ref Range Status   11/23/2017 21.7 (L) 22.0 - 26.0 mmol/L Final     %FIO2 (VENOUS)   Date Value Ref Range Status   11/23/2017 21.0 % Final     Airway:* No LDAs found *  Blood pressure 95/64, pulse 90, temperature 36.4 C (97.5 F), resp. rate 18, height 1.753 m ('5\' 9"'$ ), weight 95.8 kg (211 lb 3.2 oz), SpO2 95 %.

## 2017-11-25 DIAGNOSIS — K5791 Diverticulosis of intestine, part unspecified, without perforation or abscess with bleeding: Secondary | ICD-10-CM

## 2017-11-25 LAB — CBC WITH DIFF
BASOPHIL #: <0.1 10*3/uL (ref <=0.20)
BASOPHIL %: 0 %
EOSINOPHIL #: 0.12 x10ˆ3/uL (ref <=0.50)
EOSINOPHIL %: 1 %
HCT: 24.4 % — ABNORMAL LOW (ref 38.9–52.0)
HGB: 8.1 g/dL — ABNORMAL LOW (ref 13.4–17.5)
IMMATURE GRANULOCYTE #: <0.1 10*3/uL (ref <0.10)
IMMATURE GRANULOCYTE %: 0 % (ref 0–1)
LYMPHOCYTE #: 1.62 x10ˆ3/uL (ref 1.00–4.80)
LYMPHOCYTE %: 18 %
MCH: 31.5 pg (ref 26.0–32.0)
MCHC: 33.2 g/dL (ref 31.0–35.5)
MCV: 94.9 fL (ref 78.0–100.0)
MONOCYTE #: 0.87 x10ˆ3/uL (ref 0.20–1.10)
MONOCYTE %: 10 %
MPV: 11.1 fL (ref 8.7–12.5)
NEUTROPHIL #: 6.28 x10ˆ3/uL (ref 1.50–7.70)
NEUTROPHIL %: 71 %
PLATELETS: 69 x10ˆ3/uL — ABNORMAL LOW (ref 150–400)
RBC: 2.57 10*6/uL — ABNORMAL LOW (ref 4.50–6.10)
RDW-CV: 13.9 % (ref 11.5–15.5)
WBC: 9 x10ˆ3/uL (ref 3.7–11.0)

## 2017-11-25 LAB — BASIC METABOLIC PANEL
ANION GAP: 6 mmol/L (ref 4–13)
BUN/CREA RATIO: 17 (ref 6–22)
BUN: 19 mg/dL (ref 8–25)
CALCIUM: 7.8 mg/dL — ABNORMAL LOW (ref 8.5–10.2)
CHLORIDE: 105 mmol/L (ref 96–111)
CO2 TOTAL: 25 mmol/L (ref 22–32)
CREATININE: 1.09 mg/dL (ref 0.62–1.27)
ESTIMATED GFR: >60 mL/min/{1.73_m2} (ref >60)
GLUCOSE: 94 mg/dL (ref 65–139)
POTASSIUM: 3.7 mmol/L (ref 3.5–5.1)
SODIUM: 136 mmol/L (ref 136–145)

## 2017-11-25 LAB — H & H
HCT: 26.1 % — ABNORMAL LOW (ref 38.9–52.0)
HCT: 24.3 % — ABNORMAL LOW (ref 38.9–52.0)
HCT: 24.1 % — ABNORMAL LOW (ref 38.9–52.0)
HGB: 8.6 g/dL — ABNORMAL LOW (ref 13.4–17.5)
HGB: 8.1 g/dL — ABNORMAL LOW (ref 13.4–17.5)
HGB: 8 g/dL — ABNORMAL LOW (ref 13.4–17.5)

## 2017-11-25 LAB — TESTOSTERONE FREE (DIALYSIS) AND TOTAL,MS
TESTOSTERONE, FREE, NG/DL: 7.79 ng/dL (ref 3.87–14.7)
TESTOSTERONE, TOTAL, SERUM: 433 ng/dL (ref 240–950)

## 2017-11-25 LAB — PHOSPHORUS: PHOSPHORUS: 3.8 mg/dL (ref 2.4–4.7)

## 2017-11-25 LAB — LAVENDER TOP TUBE

## 2017-11-25 LAB — MAGNESIUM: MAGNESIUM: 1.7 mg/dL (ref 1.6–2.6)

## 2017-11-25 MED ORDER — CYCLOBENZAPRINE 5 MG TABLET
5.0000 mg | ORAL_TABLET | Freq: Once | ORAL | Status: AC
Start: 2017-11-25 — End: 2017-11-25
  Administered 2017-11-25: 5 mg via ORAL
  Filled 2017-11-25: qty 1

## 2017-11-25 MED ORDER — SODIUM CHLORIDE 0.9 % IV BOLUS
1000.00 mL | INJECTION | Status: AC
Start: 2017-11-25 — End: 2017-11-25
  Administered 2017-11-25: 1000 mL via INTRAVENOUS
  Administered 2017-11-25: 0 mL via INTRAVENOUS

## 2017-11-25 MED ADMIN — CLINDAMYCIN IVPB - BMC: INTRAVENOUS | @ 12:00:00

## 2017-11-25 MED ADMIN — sodium chloride 0.9 % intravenous solution: ORAL | @ 09:00:00 | NDC 00338004904

## 2017-11-25 NOTE — Care Plan (Signed)
Patient complained of an occurrence of chest pain today and abdominal discomfort. H&H continued to be trended Q4hr.  Fall and skin precautions maintained. Frequent repositioning encouraged. Sitter select active and audible. Ambulated in room today with assist of one. Instructed to call for assistance when ambulating. Call bell within reach. VSS. Emotional support provided to patient.

## 2017-11-25 NOTE — Progress Notes (Signed)
[facility]  Internal Medicine  DAILY PROGRESS NOTE     Richard [name], 57 y.o., male MRN: [id number]   DOB: Mar 02, 1961 Date of Service: 11/25/2017   PCP: Gavin Potters, APRN Code Status: Full Code discussed with patient     SUBJECTIVE:     Mr Richard [name] had a good sleep overnight. No blood in stools noticed and his vitals were stable. He was started on high fiber diet. BP meds are on hold for now. Did complain of mild right lower abdomen pain and mild right sided chest pain.    Denies fever, chills, SOB or N/V.    PHYSICAL EXAM:     Temperature: 36.6 C (97.9 F) Heart Rate: 92 BP (Non-Invasive): 104/69   Respiratory Rate: 16 SpO2: 93 % Weight: 96.2 kg (212 lb 1.3 oz)       Intake/Output Summary (Last 24 hours) at 11/25/2017 0926  Last data filed at 11/25/2017 5208  Gross per 24 hour   Intake 550 ml   Output 700 ml   Net -150 ml     Last Bowel Movement: 11/24/17    Constitutional: This is an acutely ill, well nourished male  Eyes: EOMI, no scleral jaundice or conjunctival erythema noted.   HENT: Normocephalic and atraumatic.  Neck:  Supple, symmetric, trachea midline, no masses. The thyroid appears normal. There is no JVD.  Respiratory: Lungs are clear to auscultation. No wheezing, rales or rhonchi noted. There is normal respiratory effort.  Cardiovascular: S1, S2 are normal. The rhythm is regular. There is no click, murmur, rub or gallop noted.   Lower Extremities:  No cyanosis or edema noted.  Gastrointestinal: Normal bowel sounds  The abdomen is soft, non-distended, mildl RLQ tenderness with out guarding or rebound. There are no masses appreciated.  Musculoskeletal: No obvious deformity or swelling.   Skin: Paleness has improved from yesterday's exam, no rashes noted.  Neurologic: CN's Grossly intact, alert and oriented X 3.  Lymphatic: No lymphadenopathy appreciated.  Psychiatric: appears in good mood, fluent in speech and cognition.      DIAGNOSTIC STUDIES:     I have personally reviewed labs & imaging.      CBC Differential   Recent Labs     11/23/17  1618  11/24/17  1437  11/24/17  1950 11/25/17  0352 11/25/17  0847   WBC 8.8  --  9.9  --   --  9.0  --    HGB 14.5   < > 10.2*   < > 9.4* 8.1* 8.6*   HCT 43.7   < > 30.9*   < > 28.2* 24.4* 26.1*   PLTCNT 154  --  90*  --   --  69*  --     < > = values in this interval not displayed.    Recent Labs     11/23/17  1618 11/24/17  1437 11/25/17  0352   PMNS 68 77 71   MONOCYTES 11 9 10    BASOPHILS 1  <0.10 0  <0.10 0  <0.10   PMNABS 6.09 7.62 6.28   LYMPHSABS 1.45 1.29 1.62   MONOSABS 0.99 0.85 0.87   EOSABS 0.16 <0.10 0.12      BMP LFTs   Recent Labs     11/24/17  1437 11/25/17  0352   SODIUM 140 136   POTASSIUM 4.0 3.7   CHLORIDE 109 105   CO2 26 25   BUN 21 19   CREATININE 1.27 1.09  GLUCOSENF 105 94   ANIONGAP 5 6   BUNCRRATIO 17 17   GFR 58* >60   CALCIUM 8.0* 7.8*   MAGNESIUM 1.8 1.7   PHOSPHORUS 3.8 3.8   ALBUMIN 2.9*  --     Recent Labs     11/24/17  1437   TOTALPROTEIN 5.2*   ALBUMIN 2.9*   TOTBILIRUBIN 0.3   BILIRUBINCON 0.1   AST 21   ALT 17   ALKPHOS 77      CoAgs Blood Gas:   Recent Labs     11/24/17  1437   PROTHROMTME 13.9   INR 1.18    No results found for this encounter    Cardiac Markers Lipid Panel   Recent Labs     11/23/17  1630 11/24/17  1437 11/24/17  1625   TROPONINI 30 106* 101*    No results found for this encounter   Urine Analysis Other Labs   No results found for this encounter No results found for this encounter    Invalid input(s): PRL      EKG: personally reviewed by me and showed normal sinus rhythm with QTc of 432. (11/23/2017)    CURRENT INPATIENT MEDICATION LIST:     Current Facility-Administered Medications:  amitriptyline (ELAVIL) tablet 10 mg Oral NIGHTLY   amLODIPine (NORVASC) tablet 10 mg Oral Daily   calcium citrate + vitamin D (CITRACAL) 200mg  (elemental Ca)-200 unit tab 1 Tab Oral Daily   levETIRAcetam (KEPPRA) tablet 500 mg Oral 2x/day   levothyroxine (SYNTHROID) tablet 100 mcg Oral QAM   losartan (COZAAR) tablet 50 mg Oral  Daily   melatonin tablet 3 mg Oral HS PRN   multivitamin tablet 1 Tab Oral Daily   NS flush syringe 2 mL Intracatheter Q8HRS   And      NS flush syringe 2-6 mL Intracatheter Q1 MIN PRN   pantoprazole (PROTONIX) delayed release tablet 40 mg Oral 2x/day   psyllium (METAMUCIL) oral powder 1 Packet Oral 2x/day   tamsulosin (FLOMAX) capsule 0.4 mg Oral Daily after Dinner       ASSESSMENT:     Mr. [name] remains hospitalized for painless per rectal bleeding 2/2 diverticulosis with clotted blood in diverticula. PMH is significant for bowel obstruction s/p resection, aortic valve replacement, seizures, hypothyroidsm and BPH.    - Level of care at this time is stepdown.    Active Hospital Problems    Diagnosis Date Noted   . Principle Problem: Acute GI bleeding [K92.2] 11/23/2017      Resolved Hospital Problems   No resolved problems to display.     PLAN:     Diverticular Bleeding:  Colonoscopy showed diverticulosis with clotted blood in diverticula.   Bleeding has resolved. Patient's vital are stable.   On high fiber diet.  Will continue trending H&H Q8h  Will transfuse for Hb<7.     Seizures:  - Past h/o seizures since 2000  - on Keppra 500mg  BID    Aortic valve replacement:  - Done 10 years ago, bioprosthetic valve.   - According to patient last stress test and TTE was a month ago and were fine.   - Chart review shows a TTE done in 8/19 that shows an EF of 60-70% with moderate stenosis of bioprosthetic aortic valve with mean gradient 36 mmHg.     Hypertension: (Holding BP meds right now)  - Amlodepine 10mg  QD  - Losartan 50 mg QD    Dyslipidemias:  - h/o intolerance to statins  -  on Fish oil at home    BPH:  - Flomax 0.4mg  oral qd    Hypothyroidism;   - Synthroid 155mcg Oral Qd    Additional Comorbid Conditions Present:  - None    DVT PPx: Moderate risk for DVT  Anticoagulants (last 24 hours)     None        DISCHARGE & DISPOSITION PLANNING:     Anticipated discharge needs: TBD  Anticipated discharge location:  TBD    Creola Corn, MD - 11/25/2017, 10:49       I saw and examined the patient.  I reviewed the resident's note.  I agree with the findings and plan of care as documented in the resident's note.  Any exceptions/additions are edited/noted.    [name], MD

## 2017-11-25 NOTE — Nurses Notes (Signed)
Patient had no bloody stools this shift as of 0625. Will update note as needed before shift if that changes.  VSS throughout night, no acute distress, rested well, but needed 2L NC from 0300 forward to maintain sats. @ 0600 pt reported "discomfort" in the middle to lower right quadrant.  Hyperactive bowel sounds auscultated, will update as needed for remainder of shift. All precautions maintained, pt used bedside commode only, personal items and call bell are within reach.

## 2017-11-25 NOTE — Nurses Notes (Signed)
Pt is complaining of chest pain that is sharp and tight feeling. He denies any shortness of breathe, nausea, or vomiting. He stated the pain is 6/10. BP is 104/69 and HR is 94. Dr. Humphrey Rolls from Medicine paged and a resident is to come to the bedside to assess pt.

## 2017-11-26 LAB — CBC WITH DIFF
BASOPHIL #: <0.1 10*3/uL (ref <=0.20)
BASOPHIL %: 0 %
EOSINOPHIL #: 0.14 10*3/uL (ref <=0.50)
EOSINOPHIL %: 2 %
HCT: 23.3 % — ABNORMAL LOW (ref 38.9–52.0)
HGB: 7.6 g/dL — ABNORMAL LOW (ref 13.4–17.5)
IMMATURE GRANULOCYTE #: <0.1 10*3/uL (ref <0.10)
IMMATURE GRANULOCYTE %: 0 % (ref 0–1)
LYMPHOCYTE #: 1.25 10*3/uL (ref 1.00–4.80)
LYMPHOCYTE %: 14 %
MCH: 31.7 pg (ref 26.0–32.0)
MCHC: 32.6 g/dL (ref 31.0–35.5)
MCV: 97.1 fL (ref 78.0–100.0)
MONOCYTE #: 0.79 10*3/uL (ref 0.20–1.10)
MONOCYTE %: 9 %
MPV: 10.7 fL (ref 8.7–12.5)
NEUTROPHIL #: 6.76 10*3/uL (ref 1.50–7.70)
NEUTROPHIL %: 75 %
PLATELETS: 78 10*3/uL — ABNORMAL LOW (ref 150–400)
RBC: 2.4 10*6/uL — ABNORMAL LOW (ref 4.50–6.10)
RDW-CV: 13.9 % (ref 11.5–15.5)
WBC: 9 10*3/uL (ref 3.7–11.0)

## 2017-11-26 LAB — BASIC METABOLIC PANEL
ANION GAP: 7 mmol/L (ref 4–13)
BUN/CREA RATIO: 9 (ref 6–22)
BUN: 10 mg/dL (ref 8–25)
CALCIUM: 7.9 mg/dL — ABNORMAL LOW (ref 8.5–10.2)
CHLORIDE: 108 mmol/L (ref 96–111)
CO2 TOTAL: 24 mmol/L (ref 22–32)
CREATININE: 1.08 mg/dL (ref 0.62–1.27)
ESTIMATED GFR: >60 mL/min/{1.73_m2} (ref >60)
GLUCOSE: 110 mg/dL (ref 65–139)
POTASSIUM: 3.8 mmol/L (ref 3.5–5.1)
SODIUM: 139 mmol/L (ref 136–145)

## 2017-11-26 LAB — H & H
HCT: 25 % — ABNORMAL LOW (ref 38.9–52.0)
HCT: 26 % — ABNORMAL LOW (ref 38.9–52.0)
HGB: 8.2 g/dL — ABNORMAL LOW (ref 13.4–17.5)
HGB: 8.4 g/dL — ABNORMAL LOW (ref 13.4–17.5)

## 2017-11-26 MED ORDER — MORPHINE 4 MG/ML INTRAVENOUS SOLUTION
INTRAVENOUS | Status: AC
Start: 2017-11-26 — End: 2017-11-27
  Administered 2017-11-26: 0 mg
  Filled 2017-11-26: qty 1

## 2017-11-26 MED ORDER — LOSARTAN 50 MG TABLET
50.0000 mg | ORAL_TABLET | Freq: Every day | ORAL | Status: DC
Start: 2017-11-26 — End: 2017-12-01
  Administered 2017-11-26 – 2017-11-28 (×3): 50 mg via ORAL
  Filled 2017-11-26 (×5): qty 1

## 2017-11-26 MED ADMIN — pantoprazole 40 mg tablet,delayed release: ORAL | @ 21:00:00

## 2017-11-26 NOTE — Care Plan (Signed)
Patient denied chest pain, SOB, and all other pain. Fall and skin precautions maintained. Frequent repositioning encouraged. Sitter select active and audible. Ambulated in hall today with assist of  one; up in chair for majority of the day. Pt remains unsteady on his feet walking long distances. Instructed to call for assistance when ambulating. Call bell within reach. VSS. Emotional support provided to patient.

## 2017-11-26 NOTE — Care Plan (Signed)
Patient AAOX4, cooperative with care. Odd affect. Vital signs stable. Denies pain. BSC with 1 assist. No BM overnight. Fall precautions maintained, hourly rounding provided. Assessment as per doc flow. Call bell and belongings within reach. Bed wheels locked, in lowest position. Will continue to monitor.

## 2017-11-26 NOTE — Progress Notes (Signed)
[facility]  Internal Medicine  DAILY PROGRESS NOTE     Richard [name], 57 y.o., male MRN: [id number]   DOB: 11-11-1960 Date of Service: 11/26/2017   PCP: Richard Potters, APRN Code Status: Full Code discussed with patient     SUBJECTIVE:     Patient is awake and alert, lying comfortably in bed at the time of my exam.  Does not report any new complaints.  No acute overnight events.    Denies any nausea, vomiting, abdominal pain, any other complaints at present.      PHYSICAL EXAM:     Temperature: 36.7 C (98.1 F) Heart Rate: 93 BP (Non-Invasive): 126/79   Respiratory Rate: 18 SpO2: 95 % Weight: 96.2 kg (212 lb 1.3 oz)       Intake/Output Summary (Last 24 hours) at 11/26/2017 1330  Last data filed at 11/26/2017 1000  Gross per 24 hour   Intake 820 ml   Output 2350 ml   Net -1530 ml     Last Bowel Movement: 11/24/17    Constitutional: This is an acutely ill, well nourished male appears in no distress at the moment  Eyes: EOMI, no scleral jaundice or conjunctival erythema noted.   HENT: Normocephalic and atraumatic.  Neck:  Supple, symmetric, trachea midline, no masses. The thyroid appears normal. There is no JVD.  Respiratory: Lungs are clear to auscultation. No wheezing, rales or rhonchi noted. There is normal respiratory effort.  Cardiovascular: S1, S2 are normal. The rhythm is regular. There is no click, murmur, rub or gallop noted.   Lower Extremities:  No cyanosis or edema noted.  Gastrointestinal: Normal bowel sounds  The abdomen is soft, non-distended, mildl RLQ tenderness with out guarding or rebound. There are no masses appreciated.  Musculoskeletal: No obvious deformity or swelling.   Skin: Paleness has improved from yesterday's exam, no rashes noted.  Neurologic: CN's Grossly intact, alert and oriented X 3.  Lymphatic: No lymphadenopathy appreciated.  Psychiatric: fluent in speech and cognition.      DIAGNOSTIC STUDIES:     I have personally reviewed labs & imaging.   CBC Differential   Recent Labs        11/24/17  1437  11/25/17  0352  11/25/17  1116 11/25/17  1721 11/26/17  0450   WBC 9.9  --  9.0  --   --   --  9.0   HGB 10.2*   < > 8.1*   < > 8.1* 8.0* 7.6*   HCT 30.9*   < > 24.4*   < > 24.3* 24.1* 23.3*   PLTCNT 90*  --  69*  --   --   --  78*    < > = values in this interval not displayed.    Recent Labs     11/24/17  1437 11/25/17  0352 11/26/17  0450   PMNS 77 71 75   MONOCYTES 9 10 9    BASOPHILS 0  <0.10 0  <0.10 0  <0.10   PMNABS 7.62 6.28 6.76   LYMPHSABS 1.29 1.62 1.25   MONOSABS 0.85 0.87 0.79   EOSABS <0.10 0.12 0.14      BMP LFTs   Recent Labs     11/26/17  0450   SODIUM 139   POTASSIUM 3.8   CHLORIDE 108   CO2 24   BUN 10   CREATININE 1.08   GLUCOSENF 110   ANIONGAP 7   BUNCRRATIO 9   GFR >60   CALCIUM  7.9*    No results found for this encounter   CoAgs Blood Gas:   No results found for this encounter No results found for this encounter    Cardiac Markers Lipid Panel   Recent Labs     11/23/17  1630 11/24/17  1437 11/24/17  1625   TROPONINI 30 106* 101*    No results found for this encounter   Urine Analysis Other Labs   No results found for this encounter No results found for this encounter    Invalid input(s): PRL      EKG: personally reviewed by me and showed normal sinus rhythm with QTc of 432. (11/23/2017)    CURRENT INPATIENT MEDICATION LIST:       Current Facility-Administered Medications:  amitriptyline (ELAVIL) tablet 10 mg Oral NIGHTLY   calcium citrate + vitamin D (CITRACAL) 200mg  (elemental Ca)-200 unit tab 1 Tab Oral Daily   levETIRAcetam (KEPPRA) tablet 500 mg Oral 2x/day   levothyroxine (SYNTHROID) tablet 100 mcg Oral QAM   losartan (COZAAR) tablet 50 mg Oral Daily   melatonin tablet 3 mg Oral HS PRN   multivitamin tablet 1 Tab Oral Daily   NS flush syringe 2 mL Intracatheter Q8HRS   And      NS flush syringe 2-6 mL Intracatheter Q1 MIN PRN   pantoprazole (PROTONIX) delayed release tablet 40 mg Oral 2x/day   psyllium (METAMUCIL) oral powder 1 Packet Oral 2x/day   tamsulosin  (FLOMAX) capsule 0.4 mg Oral Daily after Dinner       ASSESSMENT:     Richard [name] remains hospitalized for painless per rectal bleeding 2/2 diverticulosis with clotted blood in diverticula. PMH is significant for bowel obstruction s/p resection, aortic valve replacement, seizures, hypothyroidsm and BPH.    - Level of care at this time is stepdown.    Active Hospital Problems    Diagnosis Date Noted   . Principle Problem: Acute GI bleeding [K92.2] 11/23/2017      Resolved Hospital Problems   No resolved problems to display.     PLAN:     Diverticular Bleeding:  Colonoscopy showed diverticulosis with clotted blood in diverticula.   Bleeding has resolved. Patient's vital are stable.   On high fiber diet.  Will continue trending H&H, will repeat in pm today  Will transfuse for Hb<7.     Seizures:  - Past h/o seizures since 2000  - on Keppra 500mg  BID    Aortic valve replacement:  - 10 years ago, bioprosthetic valve.   - Chart review shows a TTE done in 8/19 that shows an EF of 60-70% with moderate stenosis of bioprosthetic aortic valve with mean gradient 36 mmHg.     Hypertension:  - home Amlodepine 10mg  QD on hold due to normotension  - Losartan 50 mg QD restarted today    Dyslipidemias:  - h/o intolerance to statins  - on Fish oil at home    BPH:  - Flomax 0.4mg  oral qd    Hypothyroidism;   - Synthroid 154mcg Oral Qd    Additional Comorbid Conditions Present:  - None    DVT PPx: Moderate risk for DVT  Anticoagulants (last 24 hours)     None        DISCHARGE & DISPOSITION PLANNING:     Anticipated discharge needs: TBD  Anticipated discharge location: TBD    Richard Goodell, MD      I saw and examined the patient.  I reviewed the resident's note.  I agree  with the findings and plan of care as documented in the resident's note.  Any exceptions/additions are edited/noted.    Richard Primus, MD

## 2017-11-26 NOTE — Nurses Notes (Signed)
pt received, urinal and water pitcher given, pt vitals taken, pt states he has two liters of oxygen at h our of sleep, hooked up humidification and oxygen tubing for patient.

## 2017-11-26 NOTE — Nurses Notes (Signed)
Report called to [place], RN [phone]. Patient transferred via two patient transports.

## 2017-11-27 ENCOUNTER — Other Ambulatory Visit (INDEPENDENT_AMBULATORY_CARE_PROVIDER_SITE_OTHER): Payer: Self-pay

## 2017-11-27 ENCOUNTER — Encounter (INDEPENDENT_AMBULATORY_CARE_PROVIDER_SITE_OTHER): Payer: Self-pay | Admitting: Neurology

## 2017-11-27 LAB — BASIC METABOLIC PANEL
ANION GAP: 6 mmol/L (ref 4–13)
BUN/CREA RATIO: 16 (ref 6–22)
BUN: 17 mg/dL (ref 8–25)
CALCIUM: 8.4 mg/dL — ABNORMAL LOW (ref 8.5–10.2)
CHLORIDE: 107 mmol/L (ref 96–111)
CO2 TOTAL: 26 mmol/L (ref 22–32)
CREATININE: 1.06 mg/dL (ref 0.62–1.27)
ESTIMATED GFR: >60 mL/min/1.73mˆ2 (ref >60)
GLUCOSE: 103 mg/dL (ref 65–139)
POTASSIUM: 4.2 mmol/L (ref 3.5–5.1)
SODIUM: 139 mmol/L (ref 136–145)

## 2017-11-27 LAB — CBC WITH DIFF
BASOPHIL #: <0.1 10*3/uL (ref <=0.20)
BASOPHIL %: 1 %
EOSINOPHIL #: 0.15 x10ˆ3/uL (ref <=0.50)
EOSINOPHIL %: 2 %
HCT: 23.2 % — ABNORMAL LOW (ref 38.9–52.0)
HGB: 7.6 g/dL — ABNORMAL LOW (ref 13.4–17.5)
IMMATURE GRANULOCYTE #: <0.1 x10ˆ3/uL (ref <0.10)
IMMATURE GRANULOCYTE %: 0 % (ref 0–1)
LYMPHOCYTE #: 1.03 10*3/uL (ref 1.00–4.80)
LYMPHOCYTE %: 14 %
MCH: 31.8 pg (ref 26.0–32.0)
MCHC: 32.8 g/dL (ref 31.0–35.5)
MCV: 97.1 fL (ref 78.0–100.0)
MONOCYTE #: 0.71 x10ˆ3/uL (ref 0.20–1.10)
MONOCYTE %: 10 %
MPV: 10.6 fL (ref 8.7–12.5)
NEUTROPHIL #: 5.47 x10ˆ3/uL (ref 1.50–7.70)
NEUTROPHIL %: 73 %
PLATELETS: 85 x10ˆ3/uL — ABNORMAL LOW (ref 150–400)
RBC: 2.39 x10ˆ6/uL — ABNORMAL LOW (ref 4.50–6.10)
RDW-CV: 13.7 % (ref 11.5–15.5)
WBC: 7.4 x10ˆ3/uL (ref 3.7–11.0)

## 2017-11-27 LAB — H & H
HCT: 21.7 % — ABNORMAL LOW (ref 38.9–52.0)
HCT: 25.4 % — ABNORMAL LOW (ref 38.9–52.0)
HCT: 25.4 % — ABNORMAL LOW (ref 38.9–52.0)
HCT: 24.6 % — ABNORMAL LOW (ref 38.9–52.0)
HGB: 7.2 g/dL — ABNORMAL LOW (ref 13.4–17.5)
HGB: 8.2 g/dL — ABNORMAL LOW (ref 13.4–17.5)
HGB: 8.4 g/dL — ABNORMAL LOW (ref 13.4–17.5)
HGB: 8.1 g/dL — ABNORMAL LOW (ref 13.4–17.5)

## 2017-11-27 LAB — PHOSPHORUS: PHOSPHORUS: 3.4 mg/dL (ref 2.4–4.7)

## 2017-11-27 LAB — MAGNESIUM: MAGNESIUM: 1.6 mg/dL (ref 1.6–2.6)

## 2017-11-27 MED ORDER — ONDANSETRON HCL 4 MG TABLET
4.00 mg | ORAL_TABLET | ORAL | Status: AC
Start: 2017-11-28 — End: 2017-11-28
  Administered 2017-11-28: 4 mg via ORAL
  Filled 2017-11-27: qty 1

## 2017-11-27 MED ORDER — SODIUM CHLORIDE 0.9 % IV BOLUS
40.00 mL | INJECTION | Freq: Once | Status: AC | PRN
Start: 2017-11-27 — End: 2017-11-27

## 2017-11-27 MED ADMIN — psyllium oral packet: ORAL | @ 08:00:00

## 2017-11-27 MED ADMIN — nitroglycerin 0.4 mg sublingual tablet: @ 14:00:00

## 2017-11-27 NOTE — Respiratory Therapy (Signed)
Sleep Study:    0040 - Apnea Link and HRPO set up    0128 - system check performed    0520 - equipment removed    Pt stated he did not feel that he slept well. Upon removing the equipment I noticed the Apnea Link belt had been loosened by the patient stating he could sleep right with it positioned the way that it was. Unsure when the belt was loosened, however it was on during the system checks.

## 2017-11-27 NOTE — Care Plan (Signed)
pt transferred from another floor, pt having sleep study between 51midngiht and four am.       Problem: Adult Inpatient Plan of Care  Goal: Plan of Care Review  Outcome: Ongoing (see interventions/notes)  Goal: Patient-Specific Goal (Individualization)  Outcome: Ongoing (see interventions/notes)  Goal: Absence of Hospital-Acquired Illness or Injury  Outcome: Ongoing (see interventions/notes)  Goal: Optimal Comfort and Wellbeing  Outcome: Ongoing (see interventions/notes)  Goal: Rounds/Family Conference  Outcome: Ongoing (see interventions/notes)     Problem: Fall Injury Risk  Goal: Absence of Fall and Fall-Related Injury  Outcome: Ongoing (see interventions/notes)     Problem: Adjustment to Illness (Gastrointestinal Bleeding)  Goal: Optimal Coping with Acute Illness  Outcome: Ongoing (see interventions/notes)     Problem: Bleeding (Gastrointestinal Bleeding)  Goal: Hemostasis  Outcome: Ongoing (see interventions/notes)

## 2017-11-27 NOTE — Nurses Notes (Signed)
Results for Richard [name], Richard [name] (MRN [id number]) as of 11/27/2017 09:32   Ref. Range 11/27/2017 07:21   HGB Latest Ref Range: 13.4 - 17.5 g/dL 7.6 (L)       Notified [name] about hemoglobin drop. Awaiting call back.

## 2017-11-27 NOTE — Student (Signed)
[facility]  History & Physical     Name: Richard [name] , 57 y.o. , Dema Severin, Male  DOB:May 07, 1960    Date of Admission: 11/23/2017  Date: 11/27/2017   PCP: Gavin Potters, APRN      CC: Blood in stool   Information obtained from: patient      HPI:   Richard [name] is a 57y.o. male who presented to the ED with diffuse bright red blood in the stool in addition to associated N/V, dizziness and an episode of syncopy. Pt has a PMH of a previous GI bleed in 2017 postoperatively of a partial colectomy due to bowel obstruction, HTN, hypothyroidism, GERD, Epilepsy, and HLD. Pt denies any previous history of bleeding issues. Pt denies taking any home anticoagulants. Review of systems is negative denies nausea, vomiting, abdominal pain, or any other complaints at present.     PMH:  Past Medical History:   Diagnosis Date   . Anxiety    . BPH (benign prostatic hyperplasia)    . Congenital anomaly of kidney     HORSESHOE KIDNEY   . Depression    . Diarrhea     CDIFF   . Epilepsy ([facility] [place])     OVER 10 YEARS SINCE LAST SEIZURE-DR WIEMER   . Esophageal reflux    . GIB (gastrointestinal bleeding) 2017   . High blood pressure    . Hx of aortic valve replacement    . Hypercholesterolemia    . Hyperlipidemia    . Hypothyroidism    . Mass of esophagus    . Nodular prostate    . Palpitations    . Pneumonia    . Testicular hypofunction        SURGICAL HISTORY  Past Surgical History:   Procedure Laterality Date   . COLONOSCOPY     . COLONOSCOPY N/A 11/24/2017    Performed by Vella Raring, MD at [place]   . HX AORTIC VALVE REPLACEMENT  2009   . HX COLECTOMY  2016    FOR OBSTRUCTION   . HX TURP     . HX UPPER ENDOSCOPY  04/05/2016   . INCISIONAL HERNIA REPAIR     . VENTRAL HERNIA REPAIR          MEDICATIONS:  Current Facility-Administered Medications:   1. amitriptyline (ELAVIL) tablet, 10 mg, Oral, QPM  2. calcium citrate + vitamin D (CITRACAL) 200mg  (elemental Ca)-200 unit tab, 1 Tab, Oral, QD  3. levETIRAcetam  (KEPPRA) tablet, 500 mg, Oral, BID  4. levothyroxine (SYNTHROID) tablet, 100 mcg, Oral, QAM  5. losartan (COZAAR) tablet, 50 mg, Oral, QD  6. melatonin tablet, 3 mg, Oral, HS PRN  7. multivitamin tablet, 1 Tab, Oral, QD  8. pantoprazole (PROTONIX) delayed release tablet, 40 mg, Oral, BID,   9. psyllium (METAMUCIL) oral powder, 1 Packet, Oral, BID,  10. tamsulosin (FLOMAX) capsule, 0.4 mg, Oral, QD after dinner    FAMILY HISTORY:  Mother: CAD, HLD, Thyroid Disease - deceased  Father: CAD, MI, HLD - deceased  Sister: HTN and [place] - L&W  Sister: HTN - L&W    SOCIAL HISTORY:  Occupation: disabled  Travel: Denies  Tobacco/Smoking: Never  Illicit Drug Use: Never  ETOH: Glass of wine less than monthly   Sexual: currently active  Marital status: married    ROS:   Constitutional:  Negative for fever, chills, diaphoresis  Eyes:  Negative for visual disturbance, blurred vision, irritation  Ears, Nose, Mouth, Throat, Face:  Negative for ear discharge, nasal congestion, sore throat  Respiratory:  Negative for cough, sputum, dyspnea on exertion  Cardiovascular:  Negative for chest pain, dyspnea, palpitations, or lower ext. Edema  Gastrointestinal:  Negative for nausea, vomiting, diarrhea, constipation, abdominal pain  Genitourinary:  Negative for frequency, dysuria, hematuria  Integument/breast:  Negative for rash, pruritus, lesions  Hematologic:  Negative for bleeding, easy bruising, lymphadenopathy  Musculoskeletal:  Negative for myalgias, arthralgias, back pain, muscle weakness  Neurological:  Negative for headaches, dizziness, seizures  Behavioral/Psych:  Negative for depression, anxiety     EXAM:  Temperature: 36.4C (97.24F)  Heart Rate: 107  BP (Non-Invasive): 158/85  Respiratory Rate: 20  SpO2: 92%  Pain Score (Numeric, Faces): 0  General: Ill appearing, well nourished in no acute distress.  Eyes: Conjunctiva clear.  Pupils equal, round, reactive.  No scleral icterus.    HENT:  Normocephalic, atraumatic. Mucous  membranes moist.    Neck: Supple, trachea midline.  No JVD or carotid bruit noted.  Lungs: Clear bilaterally.  No wheezes, rales, or rhonchi noted.    Cardiovascular:  Regular rate and rhythm.  No murmurs, rubs, or gallops noted.    Abdomen:  Mild distension; Soft, non-tender.  Bowel sounds normal.  Extremities: No cyanosis or edema.    Skin:  No rashes or lesions noted.    Neurologic:  Cranial nerves, motor, sensory grossly normal.    LABS:    Results for orders placed or performed during the hospital encounter of 11/23/17 (from the past 24 hour(s))   H & H   Result Value Ref Range    HGB 8.2 (L) 13.4 - 17.5 g/dL    HCT 25.0 (L) 38.9 - 52.0 %   H & H   Result Value Ref Range    HGB 8.4 (L) 13.4 - 17.5 g/dL    HCT 26.0 (L) 38.9 - 52.0 %   CBC/DIFF    Narrative    The following orders were created for panel order CBC/DIFF.  Procedure                               Abnormality         Status                     ---------                               -----------         ------                     CBC WITH YTKZ[[id number]]                Abnormal            Final result                 Please view results for these tests on the individual orders.   CBC WITH DIFF   Result Value Ref Range    WBC 7.4 3.7 - 11.0 x10^3/uL    RBC 2.39 (L) 4.50 - 6.10 x10^6/uL    HGB 7.6 (L) 13.4 - 17.5 g/dL    HCT 23.2 (L) 38.9 - 52.0 %    MCV 97.1 78.0 - 100.0 fL    MCH 31.8 26.0 - 32.0 pg    MCHC 32.8 31.0 - 35.5  g/dL    RDW-CV 13.7 11.5 - 15.5 %    PLATELETS 85 (L) 150 - 400 x10^3/uL    MPV 10.6 8.7 - 12.5 fL    NEUTROPHIL % 73 %    LYMPHOCYTE % 14 %    MONOCYTE % 10 %    EOSINOPHIL % 2 %    BASOPHIL % 1 %    NEUTROPHIL # 5.47 1.50 - 7.70 x10^3/uL    LYMPHOCYTE # 1.03 1.00 - 4.80 x10^3/uL    MONOCYTE # 0.71 0.20 - 1.10 x10^3/uL    EOSINOPHIL # 0.15 <=0.50 x10^3/uL    BASOPHIL # <0.10 <=0.20 x10^3/uL    IMMATURE GRANULOCYTE % 0 0 - 1 %    IMMATURE GRANULOCYTE # <0.10 <0.10 x10^3/uL        IMAGING/PROCEDURES:  Colonoscopy - 11/24/2017:    IMPRESSION:  1. There was old blood noted throughout the colon  2. Mild diverticulosis was noted in the sigmoid colon  3. The rectosigmoid anastomotic site appeared normal.  Overall the colonic mucosa appeared normal, small lesions could have been missed  due to the presence of blood  4. Retroflexed views revealed no abnormalities    CT Angio Abdomen - 11/23/2017:  IMPRESSION:  1.  No evidence of active hemorrhage.  2.  Normal caliber aorta without significant atherosclerotic disease.  Variant arterial anatomy as described above.  3.  Left-sided crossed fused renal ectopia. There is no hydronephrosis.  There is a small amount of nonspecific perinephric stranding.  4.  Moderate-sized hiatal hernia.  5.  Atypical location of the cecum and appendix in the midabdomen, which is  likely of no current clinical significance. The SMA and SMV are in normal  position.    EKG - 11/23/2017:   IMPRESSION:  1.Showed normal sinus rhythm with QTc of 432.       ASSESSMENT/PLAN  KAILYN DUBIE is a 57y.o. Male with painless rectal bleeding. Colonoscopy and CTA was performed and showed diverticulosis with clotted blood in diverticula. PMH is significant for bowel obstruction s/p resection with bleeding postoperatively, aortic valve replacement, seizures, hypothyroidism, and BPH.     Diverticulosis Bleeding  Start a high fiber diet  Monitor H&H  Transfuse if Hb < 7     Seizures  Continue levetiracetam - 500mg  BID    AV Valve Replacement  Surgery performed 46yrs ago - bioprosthetic valve  Last TTE and stress test was 65mo ago - No significant findings  EF - (According to TTE) 60-70% with moderate stenosis of bioprosthetic AV with mean gradient of 65mmHg performed on 8/19 per chart review    HTN (currently holding HTN meds)   Losartan - 50mg  QD  Amlodipine -10mg  QD    Dyslipidemia  Intolerant to statins -N/V  Continue fish oil at home  Educate about lifestyle modifications     BPH  Tamulosin - 0.4mg  QPM after  dinner    Hypothyroidism  Levothyroxine - 130mcg QAM      Larey Seat, MED STUDENT - MS3

## 2017-11-27 NOTE — Care Management Notes (Signed)
[facility] [place] Management Note    Patient Name: Richard [name]  Date of Birth: September 27, 1960  Sex: male  Date/Time of Admission: 11/23/2017  4:21 PM  Room/Bed: 929/A  Payor: [place] MEDICARE / Plan: [place] MEDICARE ADVANTAGE PPO / Product Type: PPO /    LOS: 4 days   Primary Care Providers:  Gavin Potters, APRN, APRN (General)    Admitting Diagnosis:  Acute GI bleeding [K92.2]    Assessment:      11/27/17 1535   Assessment Details   Assessment Type Continued Assessment   Date of Care Management Update 11/27/17   Date of Next DCP Update 11/29/17   Care Management Plan   Discharge Planning Status plan in progress   Projected Discharge Date 11/28/17   Discharge Needs Assessment   Equipment Currently Used at Home none   Equipment Needed After Discharge none   Discharge Facility/Level of Care Needs Home vs Home with Home Health   Transportation Available car;family or friend will provide       Discharge Plan:  Home vs home with Home Health  Per TBR with service, patient not medically stable for DC today. Will continue to follow.     The patient will continue to be evaluated for developing discharge needs.     Case Manager: Edwyna Ready, [place]  Phone: [phone]

## 2017-11-27 NOTE — Transitional Care (Signed)
[facility] Medicine   Transitional Care Coordination   Initial Assessment       Name: Richard [name]   Age: 57 y.o.  Date of Birth: 1960/05/11   Date of service: 11/27/2017  Date of Admission:  11/23/2017  Hospital Day:  LOS: 4 days   Lay Caregiver:  ,  ,         1. Patient appointment preferences: None  2. Transportation to healthcare appointments: Drives, Family transports  3. PCP listed as Gavin Potters, APRN. This is correct per patient and chart review. Last visit: 11/08/17.   4. Preferred method of contact: Mobile   Confirmed patient contact information:   Home Phone [phone]   Work Phone [phone]   Mobile [phone]   .       Met with Patient to discuss provider of primary care services.  Discussed role of Transitional Care Coordination Team and informed of contact within 2 business days of discharge to assess follow-up plan.    [address] [place], 11/27/2017  Ext. [phone]

## 2017-11-27 NOTE — Progress Notes (Signed)
[facility]  Internal Medicine  DAILY PROGRESS NOTE     Richard [name], 57 y.o., male MRN: [id number]   DOB: 1960/06/29 Date of Service: 11/27/2017   PCP: Gavin Potters, APRN Code Status: Full Code discussed with patient     SUBJECTIVE:   Richard [name] was lying comfortably in bed this morning. He reports having one episode of blood in stools at 10pm last night and another one this morning. He became SOB when he stood up to go to bathroom and O2 sat dropped to 88%. He was place on 4L by NC by nursing staff.    Denies chest pain, SOB, fever, chills, abdominal pain and n/v.   PHYSICAL EXAM:     Temperature: 36.4 C (97.5 F) Heart Rate: (!) 107 BP (Non-Invasive): (!) 158/85   Respiratory Rate: 20 SpO2: 92 % Weight: 85.5 kg (188 lb 7.9 oz)       Intake/Output Summary (Last 24 hours) at 11/27/2017 6270  Last data filed at 11/27/2017 0600  Gross per 24 hour   Intake 1210 ml   Output 1600 ml   Net -390 ml     Last Bowel Movement: 11/26/17    General:  Appears acutely ill  HEENT: Mucous membranes moist  Eyes: PERRL, Conjunctiva non-icteric  Neck: Soft, supple  CV: Regular rate and rhythm no murmurs  Pulm: Clear to auscultation bilaterally no wheezing  Abd: Soft, Nontender nondistended with normal bowel sounds  Extrem: No cyanosis or edema  Skin: Warm and Dry  Neuro:  Patient appears alert and oriented x 3, in good mood.  DIAGNOSTIC STUDIES:     I have personally reviewed labs & imaging.   CBC Differential   Recent Labs     11/25/17  0352  11/26/17  0450 11/26/17  1520 11/26/17  2130 11/27/17  0721   WBC 9.0  --  9.0  --   --  7.4   HGB 8.1*   < > 7.6* 8.2* 8.4* 7.6*   HCT 24.4*   < > 23.3* 25.0* 26.0* 23.2*   PLTCNT 69*  --  78*  --   --  85*    < > = values in this interval not displayed.    Recent Labs     11/25/17  0352 11/26/17  0450 11/27/17  0721   PMNS 71 75 73   MONOCYTES 10 9 10    BASOPHILS 0  <0.10 0  <0.10 1  <0.10   PMNABS 6.28 6.76 5.47   LYMPHSABS 1.62 1.25 1.03   MONOSABS 0.87 0.79 0.71   EOSABS 0.12  0.14 0.15      BMP LFTs   Recent Labs     11/27/17  0721   SODIUM 139   POTASSIUM 4.2   CHLORIDE 107   CO2 26   BUN 17   CREATININE 1.06   GLUCOSENF 103   ANIONGAP 6   BUNCRRATIO 16   GFR >60   CALCIUM 8.4*   MAGNESIUM 1.6   PHOSPHORUS 3.4    No results found for this encounter   CoAgs Blood Gas:   No results found for this encounter No results found for this encounter    Cardiac Markers Lipid Panel   Recent Labs     11/24/17  1437 11/24/17  1625   TROPONINI 106* 101*    No results found for this encounter   Urine Analysis Other Labs   No results found for this encounter No results found for this  encounter    Invalid input(s): PRL      EKG: personally reviewed by me and showed normal sinus rhythm with QTc of 432. (11/23/2017)    CURRENT INPATIENT MEDICATION LIST:       Current Facility-Administered Medications:  amitriptyline (ELAVIL) tablet 10 mg Oral NIGHTLY   calcium citrate + vitamin D (CITRACAL) 200mg  (elemental Ca)-200 unit tab 1 Tab Oral Daily   levETIRAcetam (KEPPRA) tablet 500 mg Oral 2x/day   levothyroxine (SYNTHROID) tablet 100 mcg Oral QAM   losartan (COZAAR) tablet 50 mg Oral Daily   melatonin tablet 3 mg Oral HS PRN   multivitamin tablet 1 Tab Oral Daily   NS flush syringe 2 mL Intracatheter Q8HRS   And      NS flush syringe 2-6 mL Intracatheter Q1 MIN PRN   pantoprazole (PROTONIX) delayed release tablet 40 mg Oral 2x/day   psyllium (METAMUCIL) oral powder 1 Packet Oral 2x/day   tamsulosin (FLOMAX) capsule 0.4 mg Oral Daily after Dinner       ASSESSMENT:     Richard [name] remains hospitalized for painless per rectal bleeding 2/2 diverticulosis with clotted blood in diverticula. PMH is significant for bowel obstruction s/p resection, aortic valve replacement, seizures, hypothyroidsm and BPH.    - Level of care at this time is stepdown.    Active Hospital Problems    Diagnosis Date Noted   . Principle Problem: Acute GI bleeding [K92.2] 11/23/2017      Resolved Hospital Problems   No resolved problems to  display.     PLAN:     Diverticular Bleeding:  Colonoscopy showed diverticulosis with clotted blood in diverticula.   Patient's vital are stable.   On high fiber diet.  Hb level dropped to 7.6 this morning after 2 large bloody stools overnight. O2 sat also droped below 90%  On 4L O2 by NC  Will transfuse 1 unit of PRBC today.  Will continue trending H&H.    Seizures:  - Past h/o seizures since 2000  - on Keppra 500mg  BID    Aortic valve replacement:  - 10 years ago, bioprosthetic valve.   - Chart review shows a TTE done in 8/19 that shows an EF of 60-70% with moderate stenosis of bioprosthetic aortic valve with mean gradient 36 mmHg.     Hypertension:  - home Amlodepine 10mg  QD on hold due to normotension  - Losartan 50 mg QD restarted today    Dyslipidemias:  - h/o intolerance to statins  - on Fish oil at home    BPH:  - Flomax 0.4mg  oral qd    Hypothyroidism;   - Synthroid 125mcg Oral Qd    Additional Comorbid Conditions Present:  - None    DVT PPx: Moderate risk for DVT    DISCHARGE & DISPOSITION PLANNING:     Anticipated discharge needs: TBD  Anticipated discharge location: TBD    Creola Corn, MD      I saw and examined the patient.  I reviewed the resident's note.  I agree with the findings and plan of care as documented in the resident's note.  Any exceptions/additions are edited/noted.    [name], MD

## 2017-11-27 NOTE — Nurses Notes (Signed)
Blood was started. Checked off with Fransisca Connors RN. Vital signs completed. Patient educated about the signs and symptoms to monitor while receiving blood products. Consent signed.

## 2017-11-27 NOTE — Anesthesia Postprocedure Evaluation (Signed)
Anesthesia Post Op Evaluation    Patient: Richard [name]  Procedure(s):  COLONOSCOPY    Last Vitals:Temperature: (vitals held per sleep study) (11/27/17 0342)  Heart Rate: 93 (11/26/17 2354)  BP (Non-Invasive): 129/86 (11/26/17 2354)  Respiratory Rate: 18 (11/26/17 2354)  SpO2: 94 % (11/26/17 2354)    Patient is sufficiently recovered from the effects of anesthesia to participate in the evaluation and has returned to their pre-procedure level.  Patient location during evaluation: PACU   Post-procedure handoff checklist completed    Patient participation: complete - patient participated  Level of consciousness: awake and alert and responsive to verbal stimuli  Pain management: adequate  Airway patency: patent  Anesthetic complications: no  Cardiovascular status: acceptable  Respiratory status: acceptable  Hydration status: acceptable  Patient post-procedure temperature: Pt Normothermic   PONV Status: Absent

## 2017-11-27 NOTE — Nurses Notes (Signed)
page service  [place]      pt [phone] = [place], concernsnausea med, blood stools, melatonin, please call RN Laurey Arrow [phone]    orders:  watching hgb with bowel movemnts per service, and zofran nausea, but no additional for sleep.

## 2017-11-27 NOTE — Nurses Notes (Signed)
pt sitting at the edge of bed with wife present beginning, and pt is having bowel movements tinted with blood

## 2017-11-27 NOTE — Nurses Notes (Signed)
Patient's wife at bedside. Requesting patient be put on list for private room. Also requesting MD come to bedside to provide her with an updated plan of care. RN paged Medicine 2 MD. MD to come to bedside to update wife and patient.    27 MD at bedside speaking to wife and patient. Request placed for private room.

## 2017-11-28 ENCOUNTER — Other Ambulatory Visit (INDEPENDENT_AMBULATORY_CARE_PROVIDER_SITE_OTHER): Payer: Self-pay

## 2017-11-28 ENCOUNTER — Telehealth (INDEPENDENT_AMBULATORY_CARE_PROVIDER_SITE_OTHER): Payer: Self-pay | Admitting: Surgical

## 2017-11-28 ENCOUNTER — Encounter (INDEPENDENT_AMBULATORY_CARE_PROVIDER_SITE_OTHER): Payer: Self-pay

## 2017-11-28 DIAGNOSIS — D62 Acute posthemorrhagic anemia: Secondary | ICD-10-CM

## 2017-11-28 LAB — H & H
HCT: 19.8 % — ABNORMAL LOW (ref 38.9–52.0)
HCT: 25.7 % — ABNORMAL LOW (ref 38.9–52.0)
HCT: 25.6 % — ABNORMAL LOW (ref 38.9–52.0)
HGB: 6.5 g/dL — CL (ref 13.4–17.5)
HGB: 8.4 g/dL — ABNORMAL LOW (ref 13.4–17.5)
HGB: 8.4 g/dL — ABNORMAL LOW (ref 13.4–17.5)

## 2017-11-28 LAB — CBC WITH DIFF
BASOPHIL #: <0.1 10*3/uL (ref <=0.20)
BASOPHIL %: 0 %
EOSINOPHIL #: <0.1 x10ˆ3/uL (ref <=0.50)
EOSINOPHIL %: 1 %
HCT: 21.3 % — ABNORMAL LOW (ref 38.9–52.0)
HGB: 7.1 g/dL — ABNORMAL LOW (ref 13.4–17.5)
IMMATURE GRANULOCYTE #: <0.1 10*3/uL (ref <0.10)
IMMATURE GRANULOCYTE %: 1 % (ref 0–1)
LYMPHOCYTE #: 0.8 10*3/uL — ABNORMAL LOW (ref 1.00–4.80)
LYMPHOCYTE %: 8 %
MCH: 31.8 pg (ref 26.0–32.0)
MCHC: 33.3 g/dL (ref 31.0–35.5)
MCV: 95.5 fL (ref 78.0–100.0)
MONOCYTE #: 0.71 x10ˆ3/uL (ref 0.20–1.10)
MONOCYTE %: 7 %
MONOCYTE %: 7 %
MPV: 11.3 fL (ref 8.7–12.5)
NEUTROPHIL #: 8.42 10*3/uL — ABNORMAL HIGH (ref 1.50–7.70)
NEUTROPHIL %: 83 %
PLATELETS: 100 10*3/uL — ABNORMAL LOW (ref 150–400)
RBC: 2.23 x10ˆ6/uL — ABNORMAL LOW (ref 4.50–6.10)
RDW-CV: 15.1 % (ref 11.5–15.5)
WBC: 10.1 x10ˆ3/uL (ref 3.7–11.0)

## 2017-11-28 LAB — BASIC METABOLIC PANEL
ANION GAP: 6 mmol/L (ref 4–13)
BUN/CREA RATIO: 15 (ref 6–22)
BUN: 19 mg/dL (ref 8–25)
CALCIUM: 8.2 mg/dL — ABNORMAL LOW (ref 8.5–10.2)
CHLORIDE: 109 mmol/L (ref 96–111)
CO2 TOTAL: 24 mmol/L (ref 22–32)
CREATININE: 1.29 mg/dL — ABNORMAL HIGH (ref 0.62–1.27)
ESTIMATED GFR: 57 mL/min/1.73mˆ2 — ABNORMAL LOW (ref >60)
GLUCOSE: 152 mg/dL — ABNORMAL HIGH (ref 65–139)
POTASSIUM: 4.5 mmol/L (ref 3.5–5.1)
SODIUM: 139 mmol/L (ref 136–145)

## 2017-11-28 LAB — PT/INR
INR: 1.09 (ref 0.80–1.20)
PROTHROMBIN TIME: 12.8 s (ref 9.5–14.1)

## 2017-11-28 LAB — PTT (PARTIAL THROMBOPLASTIN TIME): APTT: 28.2 s (ref 24.1–38.5)

## 2017-11-28 LAB — ADULT ROUTINE BLOOD CULTURE, SET OF 2 BOTTLES (BACTERIA AND YEAST)
BLOOD CULTURE, ROUTINE: NO GROWTH
BLOOD CULTURE, ROUTINE: NO GROWTH

## 2017-11-28 LAB — MAGNESIUM: MAGNESIUM: 1.6 mg/dL (ref 1.6–2.6)

## 2017-11-28 LAB — UREA NITROGEN, RANDOM URINE: UREA NITROGEN RANDOM URINE: 499 mg/dL

## 2017-11-28 LAB — PHOSPHORUS: PHOSPHORUS: 2.6 mg/dL (ref 2.4–4.7)

## 2017-11-28 LAB — CREATININE URINE, RANDOM: CREATININE RANDOM URINE: 71 mg/dL

## 2017-11-28 MED ORDER — SODIUM CHLORIDE 0.9 % IV BOLUS
40.00 mL | INJECTION | Freq: Once | Status: AC | PRN
Start: 2017-11-28 — End: 2017-11-28

## 2017-11-28 MED ORDER — ELECTROLYTE-A INTRAVENOUS SOLUTION
INTRAVENOUS | Status: DC
Start: 2017-11-28 — End: 2017-11-30

## 2017-11-28 MED ORDER — SODIUM CHLORIDE 0.9 % (FLUSH) INJECTION SYRINGE
10.0000 mL | INJECTION | Freq: Three times a day (TID) | INTRAMUSCULAR | Status: DC
Start: 2017-11-28 — End: 2017-12-01
  Administered 2017-11-28 – 2017-11-29 (×2): 20 mL
  Administered 2017-11-29: 10 mL
  Administered 2017-11-29: 0 mL
  Administered 2017-11-30: 10 mL
  Administered 2017-11-30: 0 mL
  Administered 2017-11-30: 20 mL
  Administered 2017-12-01 (×2): 10 mL

## 2017-11-28 MED ORDER — LIDOCAINE (PF) 10 MG/ML (1 %) INJECTION SOLUTION
0.5000 mL | Freq: Once | INTRAMUSCULAR | Status: AC
Start: 2017-11-28 — End: 2017-11-28
  Administered 2017-11-28: 0.5 mL via INTRADERMAL

## 2017-11-28 MED ORDER — SODIUM CHLORIDE 0.9 % (FLUSH) INJECTION SYRINGE
20.0000 mL | INJECTION | INTRAMUSCULAR | Status: DC | PRN
Start: 2017-11-28 — End: 2017-12-01
  Administered 2017-11-30: 20 mL
  Filled 2017-11-28: qty 30

## 2017-11-28 MED ADMIN — amitriptyline 10 mg tablet: ORAL | @ 23:00:00

## 2017-11-28 MED ADMIN — PEDS CUSTOM PARENTERAL NUTRITION - LESS THAN 1000 GRAMS: ORAL | @ 23:00:00 | NDC 00264934155

## 2017-11-28 NOTE — Care Plan (Signed)
[place]  Physical Therapy Initial Evaluation    Patient Name: Richard [name]  Date of Birth: 07/02/1960  Height: Height: 175.3 cm (5' 9.02")  Weight: Weight: 86.7 kg (191 lb 2.2 oz)  Room/Bed: 929/A  Payor: [place] MEDICARE / Plan: [place] MEDICARE ADVANTAGE PPO / Product Type: PPO /     Assessment:      Pt presents with decreased endurance and slightly impaired balance. Able to transfer and ambulate with [facility] assist. Anticipate no concerns for d/chome with assist and HH PT.     Discharge Needs:    Equipment Recommendation: none anticipated  Discharge Disposition: home with assist, home with home health    JUSTIFICATION OF DISCHARGE RECOMMENDATION   Based on current diagnosis, functional performance prior to admission, and current functional performance, this patient requires continued PT services in home with assist, home with home health in order to achieve significant functional improvements in these deficit areas: aerobic capacity/endurance, gait, locomotion, and balance.    Plan:   Current Intervention: balance training, bed mobility training, gait training, stair training, transfer training  To provide physical therapy services minimum of 2x/week  for duration of until discharge.    The risks/benefits of therapy have been discussed with the patient/caregiver and he/she is in agreement with the established plan of care.       Subjective & Objective        11/28/17 0951   Rehab Session   Document Type evaluation   Total PT Minutes: 12   General Information   Patient Profile Reviewed? yes   Pertinent History of Current Functional Problem Richard [name] is a 57 y.o. male who presented to ED today after what he described as large volume bright red blood in stool.  He reports that he was also having N/V and dizziness when the bleeding began and actually had an episode of syncope today.  He has had GI bleed before in 2017 after partial colectomy; denies having any problems with  bleeding ever since; not on any home blood thinners.   Medical Lines PIV Line   Respiratory Status room air   Existing Precautions/Restrictions full code;fall precautions   Mutuality/Individual Preferences   Individualized Care Needs up with assist x 1    Patient-Specific Goals (Include Timeframe) return home    Living Environment   Lives With spouse   Living Arrangements house   Home Accessibility stairs within home   Number of Stairs Within Home 4   Stair Railings at Home outside, present on right side   Functional Level Prior   Ambulation 0 - independent   Transferring 0 - independent   Toileting 0 - independent   Dressing 0 - independent   Eating 0 - independent   Prior Functional Level Comment independent   Self-Care   Dominant Hand right   Equipment Currently Used at Home no   Pre Treatment Status   Communication Pre Treatment  Nurse   Vital Signs   O2 Delivery Pre Treatment room air   O2 Delivery Post Treatment room air   Pain Assessment   Pre/Post Treatment Pain Comment no c/o pain    RUE Assessment   RUE Assessment WFL- Within Functional Limits   LUE Assessment   LUE Assessment WFL- Within Functional Limits   RLE Assessment   RLE Assessment WFL- Within Functional Limits   LLE Assessment   LLE Assessment WFL- Within Functional Limits   Bed Mobility Assessment/Treatment   Supine-Sit Independence stand-by  assistance   Transfer Assessment/Treatment   Sit-Stand Independence contact guard assist   Stand-Sit Independence stand-by assistance   Sit-Stand-Sit, Assist Device handheld assist   Gait Assessment/Treatment   Independence  contact guard assist   Assistive Device  hand-held assistance   Distance in Feet 100   Gait Speed moderate   Impairments  balance impaired;endurance   Balance Skill Training   Sitting Balance: Static good balance   Sitting, Dynamic (Balance) good balance   Sit-to-Stand Balance fair + balance   Standing Balance: Static fair balance   Standing Balance: Dynamic fair balance   Post Treatment  Status   Post Treatment Patient Status Patient supine in bed;Call light within reach;Telephone within reach;Sitter select activated   Plan of Care Review   Plan Of Care Reviewed With patient   Basic Mobility Am-PAC/6Clicks Score   Exercise Level Performed 7- Walked 25 feet or more   Physical Therapy Clinical Impression   Assessment Pt presents with decreased endurance and slightly impaired balance. Able to transfer and ambulate with [facility] assist. Anticipate no concerns for d/chome with assist and HH PT.    Criteria for Skilled Therapeutic yes   Impairments Found (describe specific impairments) aerobic capacity/endurance;gait, locomotion, and balance   Rehab Potential good, to achieve stated therapy goals   Therapy Frequency minimum of 2x/week   Predicted Duration of Therapy Intervention (days/wks) until discharge   Anticipated Equipment Needs at Discharge (PT) none anticipated   Anticipated Discharge Disposition home with assist;home with home health   Care Plan Goals   PT Rehab Goals Bed Mobility Goal;Gait Training Goal;Stairs Training Goal;Transfer Training Goal   Bed Mobility Goal   Bed Mobility Goal, Date Established 11/28/17   Bed Mobility Goal, Time to Achieve by discharge   Bed Mobility Goal, Activity Type all bed mobility activities   Bed Mobility Goal, Independence Level modified independence   Gait Training  Goal, Distance to Achieve   Gait Training  Goal, Date Established 11/28/17   Gait Training  Goal, Time to Achieve by discharge   Gait Training  Goal, Independence Level modified independence   Gait Training  Goal, Assist Device least restricted assistive device   Gait Training  Goal, Distance to Achieve 250   Stairs Training Goal   Stairs Training Goal, Date Established 11/28/17   Stairs Training Goal, Time to Achieve by discharge   Stairs Training Goal, Independence Level modified independence   Stairs Training Goal, Assist Device least restricted assistive device   Stairs Training Goal, Number of Stairs  to Achieve 2   Transfer Training Goal   Transfer Training Goal, Date Established 11/28/17   Transfer Training Goal, Time to Achieve by discharge   Transfer Training Goal, Activity Type all transfers   Transfer Training Goal, Independence Level modified independence   Planned Therapy Interventions, PT Eval   Planned Therapy Interventions (PT) balance training;bed mobility training;gait training;stair training;transfer training       Therapist:   Karie Georges, PT   Pager #: [phone]

## 2017-11-28 NOTE — Nurses Notes (Signed)
Plan of care for the day reviewed with the patient. Vital signs obtained per doc flow sheet. Medications reviewed and administered per eMAR. Verbalized understanding.   Patient alert and oriented x4, assessment per doc flow sheet.     Patient denies N/V or pain at this time.   No other issues or concerns reported.    Patient's call light is within reach and instructed to call out for assistance. Fall precautions in place.   Will continue to monitor.

## 2017-11-28 NOTE — Progress Notes (Signed)
[facility]  Internal Medicine  DAILY PROGRESS NOTE     Richard [name], 57 y.o., male MRN: [id number]   DOB: May 30, 1960 Date of Service: 11/28/2017   PCP: Gavin Potters, APRN Code Status: Full Code discussed with patient     SUBJECTIVE:   Richard [name] was lying comfortably in bed this morning. He reported having one bloody stool at night and one this morning. He felt lightheaded during these episodes. Patient reports that the volume of blood in stool is less than yesterday, seems like its improving. His O2 sat dropped below 90% and is on 2L O2 by NC. No other events to report from overnight.    Denies fever, chills, SOB, chest pain, abdominal pain or N/V.   PHYSICAL EXAM:     Temperature: 36.8 C (98.3 F) Heart Rate: 91 BP (Non-Invasive): 105/65   Respiratory Rate: 18 SpO2: 96 % Weight: 86.7 kg (191 lb 2.2 oz)       Intake/Output Summary (Last 24 hours) at 11/28/2017 0918  Last data filed at 11/28/2017 0851  Gross per 24 hour   Intake 1000 ml   Output --   Net 1000 ml     Last Bowel Movement: 11/27/17    General: This is an acutely ill, well nourished male  Eyes: EOMI, no scleral jaundice or conjunctival erythema noted.   HENT: Normocephalic and atraumatic.  Neck:  Supple, symmetric, trachea midline, no masses. The thyroid appears normal. There is no JVD.  Respiratory: Lungs are clear to auscultation. No wheezing, rales or rhonchi noted. There is normal respiratory effort.  Cardiovascular: S1, S2 are normal. The rhythm is regular. There is no click, murmur, rub or gallop noted.   Lower Extremities:  No cyanosis or edema noted.  Gastrointestinal: Normal bowel sounds  The abdomen is soft, non-distended, non-tender with out guarding or rebound. There are no masses appreciated.  Musculoskeletal: No obvious deformity or swelling.   Skin: Paleness, no rashes noted.  Neurologic: CN's Grossly intact, alert and oriented X 3.  Lymphatic: No lymphadenopathy appreciated.  Psychiatric: appears sad and aggitated, fluent in  speech and cognition.   DIAGNOSTIC STUDIES:     I have personally reviewed labs & imaging.   CBC Differential   Recent Labs     11/26/17  0450  11/27/17  0721  11/27/17  2258 11/28/17  0246 11/28/17  0701   WBC 9.0  --  7.4  --   --  10.1  --    HGB 7.6*   < > 7.6*   < > 8.1* 7.1* 6.5*   HCT 23.3*   < > 23.2*   < > 24.6* 21.3* 19.8*   PLTCNT 78*  --  85*  --   --  100*  --     < > = values in this interval not displayed.    Recent Labs     11/26/17  0450 11/27/17  0721 11/28/17  0246   PMNS 75 73 83   MONOCYTES 9 10 7    BASOPHILS 0  <0.10 1  <0.10 0  <0.10   PMNABS 6.76 5.47 8.42*   LYMPHSABS 1.25 1.03 0.80*   MONOSABS 0.79 0.71 0.71   EOSABS 0.14 0.15 <0.10      BMP LFTs   Recent Labs     11/28/17  0246   SODIUM 139   POTASSIUM 4.5   CHLORIDE 109   CO2 24   BUN 19   CREATININE 1.29*   GLUCOSENF 152*  ANIONGAP 6   BUNCRRATIO 15   GFR 57*   CALCIUM 8.2*   MAGNESIUM 1.6   PHOSPHORUS 2.6    No results found for this encounter   CoAgs Blood Gas:   No results found for this encounter No results found for this encounter    Cardiac Markers Lipid Panel   No results for input(s): TROPONINI, CKMB, MBINDEX, BNP in the last 72 hours. No results found for this encounter   Urine Analysis Other Labs   No results found for this encounter No results found for this encounter    Invalid input(s): PRL      EKG: personally reviewed by me and showed normal sinus rhythm with QTc of 432. (11/23/2017)    CURRENT INPATIENT MEDICATION LIST:       Current Facility-Administered Medications:  amitriptyline (ELAVIL) tablet 10 mg Oral NIGHTLY   calcium citrate + vitamin D (CITRACAL) 200mg  (elemental Ca)-200 unit tab 1 Tab Oral Daily   electrolyte-A (PLASMALYTE-A) premix infusion  Intravenous Continuous   levETIRAcetam (KEPPRA) tablet 500 mg Oral 2x/day   levothyroxine (SYNTHROID) tablet 100 mcg Oral QAM   losartan (COZAAR) tablet 50 mg Oral Daily   melatonin tablet 3 mg Oral HS PRN   multivitamin tablet 1 Tab Oral Daily   NS bolus infusion 40  mL 40 mL Intravenous Once PRN   NS bolus infusion 40 mL 40 mL Intravenous Once PRN   NS flush syringe 2 mL Intracatheter Q8HRS   And      NS flush syringe 2-6 mL Intracatheter Q1 MIN PRN   pantoprazole (PROTONIX) delayed release tablet 40 mg Oral 2x/day   psyllium (METAMUCIL) oral powder 1 Packet Oral 2x/day   tamsulosin (FLOMAX) capsule 0.4 mg Oral Daily after Dinner       ASSESSMENT:     Richard. [name] remains hospitalized for painless per rectal bleeding 2/2 diverticulosis with clotted blood in diverticula. PMH is significant for bowel obstruction s/p resection, aortic valve replacement, seizures, hypothyroidsm and BPH.    - Level of care at this time is stepdown.    Active Hospital Problems    Diagnosis Date Noted   . Principle Problem: Acute GI bleeding [K92.2] 11/23/2017      Resolved Hospital Problems   No resolved problems to display.     PLAN:     Anemia of acute blood loss/Diverticular Bleeding:  Colonoscopy showed diverticulosis with clotted blood in diverticula.    On high fiber diet.  Hb level dropped to 6.5 in last 24 hours  2 units of PRBC ordered for transfusion today  On 2L O2 by NC  Will start maintenance IV fluids due to mildly elevated creatinine  1 unit of PRBC transfused yesterday.   Will continue trending H&H every four hours, check PTT and INR at next draw        Seizures:  - Past h/o seizures since 2000  - on Keppra 500mg  BID    Aortic valve replacement:  - 10 years ago, bioprosthetic valve.   - Chart review shows a TTE done in 8/19 that shows an EF of 60-70% with moderate stenosis of bioprosthetic aortic valve with mean gradient 36 mmHg.     Hypertension:  - home Amlodepine 10mg  QD on hold due to normotension  - home Losartan 50 mg QD on hold due to normotension    Dyslipidemias:  - h/o intolerance to statins  - on Fish oil at home    BPH:  - Flomax 0.4mg  oral qd  Hypothyroidism;   - Synthroid 133mcg Oral Qd      DVT PPx: Moderate risk for DVT but anticoagulation contraindicated at  this time    DISCHARGE & DISPOSITION PLANNING:     Anticipated discharge needs: TBD  Anticipated discharge location: TBD    Creola Corn, MD      I saw and examined the patient.  I reviewed the resident's note.  I agree with the findings and plan of care as documented in the resident's note.  Any exceptions/additions are edited/noted.    Julious Payer, MD

## 2017-11-28 NOTE — Telephone Encounter (Signed)
Have left 2 messages to return call, letter mailed to call office regarding thyroid level and medication changes. Cameron Sprang, LPN  4/76/5465, 03:54

## 2017-11-28 NOTE — Pharmacist Med Reconciliation (Signed)
Pharmacy Medication Reconciliation    Patient Name: Richard, [name]  Date of Service: 11/27/2017  Date of Admission: 11/23/2017  Date of Birth: 11-08-1960  Length of Stay:   4 days   General Risk Score (GRS): 1    Transitions of Care:  1. Would you like to utilize the [facility]?   UNDECIDED  -  If no, preferred pharmacy:  [facility] in [place], [place] was collected from:  Patient, Insurance ([place] [phone]), and Pharmacy ([facility] in [place] [phone])    [place] Prior to Admission Medications:  Prior to Admission medications    Medication Sig Taking Resumed Y/N Comments   amitriptyline (ELAVIL) 10 mg Oral Tablet Take 10 mg by mouth Every night Yes  Y     amLODIPine (NORVASC) 10 mg Oral Tablet Take 10 mg by mouth Once a day Yes  N     calcium citrate-vitamin D3 (CITRACAL) 200 mg calcium -250 unit Oral Tablet Take by mouth Once a day No  Y  Patient is not taking this at home because he cannot get it paid for by his insurance.  He would like to start again.     cyclobenzaprine (FLEXERIL) 10 mg Oral Tablet TAKE 1 TABLET BY MOUTH ONCE EVERY NIGHT Yes  N     docusate sodium (COLACE) 100 mg Oral Capsule Take 100 mg by mouth Twice daily Yes  N     ergocalciferol, vitamin D2, (VITAMIN D2 ORAL) Take 1 Tab by mouth Once a day Yes  N  Patient unsure of his current dose at home but he thinks it could be 1,000 units or 2,000 units.  Unable to confirm with pharmacy or insurance.     fluticasone propionate (FLONASE) 50 mcg/actuation Nasal Spray, Suspension 2 Sprays by Each Nostril route Once a day Yes  N     levETIRAcetam (KEPPRA) 250 mg Oral Tablet Take 500 mg by mouth Twice daily  Yes  Y     levothyroxine (SYNTHROID) 100 mcg Oral Tablet Take 100 mcg by mouth Every morning Yes  Y     losartan (COZAAR) 50 mg Oral Tablet Take 50 mg by mouth Once a day Yes  Y     melatonin 5 mg Oral Tablet Take 10 mg by mouth Every night  Yes  Y  3mg  inpatient   multivitamin Oral Tablet Take 1 Tab by  mouth Once a day Yes  Y     OM-3-DHA-EPA-Fish Oil-Vit D3 (FISH OIL-VIT D3) 300-1,000-1,000 mg-mg-unit Oral Capsule Take 3 Caps by mouth Once a day Dosage 2000, mg fish oil/1000 iu Vitamin d3  Yes  N     pantoprazole (PROTONIX) 40 mg Oral Tablet, Delayed Release (E.C.) Take 40 mg by mouth Twice daily Yes  Y  Patient says he only takes once daily at home.   polyethylene glycol (MIRALAX) 17 gram/dose Oral Powder Take 17 g by mouth Once a day Yes  N     tamsulosin (FLOMAX) 0.4 mg Oral Capsule Take 0.4 mg by mouth Every evening after dinner Yes  Y           Changes made to Prior to Admission Med List:  No additions made    Medications changed:  . Changed levetiracetam from 250 mg BID to 500 mg BID    Of note:  . Patient was recently on a 21 day course of Doxycycline 100 mg BID starting on 10/06/17.    Marland Kitchen Has not  taken Aspirin 81 mg for 3 years.  . Tamsulosin has not been filled with his insurance or pharmacy, but he says he is taking it.  Pharmacy says they have a script written on 08/06/17 but it was never filled.    . Unable to confirm a fill history of pantoprazole, but patient says that he is only taking it once daily not twice daily.      Allergies:  Patient confirms allergy to statins (nausea and vomiting) and says it is only high intensity statins.  He states that he has no other allergies.      Pharmacist Recommendations:  Consider reducing pantoprazole to home dose of 40mg  once daily if not using for treatment of GI bleed.  Agree with starting other home medications as indicated.      Erskine Squibb, RX STUDENT    Truett Perna, PharmD  PGY-1 Pharmacy Resident  Phone: [phone]   Pager: [phone]

## 2017-11-28 NOTE — Nurses Notes (Signed)
paged service Medicine 2,     pt 929A= Petrosyan, nauseated again, one time order Zofran helped.  RN Laurey Arrow [phone]    ordered:

## 2017-11-28 NOTE — Consults (Signed)
[facility]  Sleep Medicine Consult Initial    Richard [name], Richard [name], 57 y.o. male  Encounter Start Date:  11/23/2017  Inpatient Admission Date:  11/23/2017  Date of service: 11/28/2017  Date of Birth:  [birth year]    Hospital Day:  LOS: 5 days     Service: Medicine 2  Requesting MD: Elsa Obtained from: patient  Chief Complaint:  Bleeding     Assessment/Recommendations: 57 yo male who presented with GI bleed thought to be sedoncary to diverticulitis and concerns for obstructive sleep apnea.   * HRPO demonstrated and ODI of 7.3 events per hour with significant hypoxemia spending 1 hour and 21 minutes with an oxygen saturation below 88%.   Findings suggestive of mild-to-moderate obstructive sleep apnea.   * I discussed the results of this finding with him. Counseled on the adverse health implications of obstructive sleep apnea and benefits from therapy.   * He is interested in pursuing treatment.    * Recommended a trial of APAP today with accommodation. He does not wish to pursue this today as he reports he had more bleeding and may need to have a procedure completed but is will for a trial of APAP tomorrow.   * Will plan for a trial of APAP with accommodation tomorrow.    * He expressed understanding of the implications of obstructive sleep apnea and the importance of addressing this condition.   * Will follow-up after APAP trial.     HPI/Discussion:  Richard [name] is a 57 y.o., White male who presents with large volume bright red blood in stool.  He reported that he was also having N/V and dizziness when the bleeding began and actually had an episode of syncope today.  He has had GI bleed before in 2017 after partial colectomy; denies having any problems with bleeding since this time. Underwent colonoscopy which revealed diverticular bleed.  Bleeding appears to have slowed down, but requiring a blood transfusion this afternoon.     He reports some snoring and rarely will wake-up choking  and gasping. Does have some morning headaches, but tends to get headaches frequently. Some days feels extremely tired and fatigued and other days feels "okay." Always thought his fatigue was related to his seizure medications.     Sleep Hygiene:  Bedtime: 11 PM   Awakenings during the night: usually once during the night   Out of Bed: Somewhere between 7-8 AM  Reports fairly regular sleep schedule.     Other Sleep Complaints   Insomnia:denied   RLS: Denied  Cataplexy: Denied  Nightmares: Denied   Hallucinations: Denied           Past Medical History:   Diagnosis Date   . Anxiety    . BPH (benign prostatic hyperplasia)    . Congenital anomaly of kidney     HORSESHOE KIDNEY   . Depression    . Diarrhea     CDIFF   . Epilepsy (CMS [place])     OVER 10 YEARS SINCE LAST SEIZURE-DR WIEMER   . Esophageal reflux    . GIB (gastrointestinal bleeding) 2017   . High blood pressure    . Hx of aortic valve replacement    . Hypercholesterolemia    . Hyperlipidemia    . Hypothyroidism    . Mass of esophagus    . Nodular prostate    . Palpitations    . Pneumonia    . Testicular hypofunction  Past Surgical History:   Procedure Laterality Date   . COLONOSCOPY     . HX AORTIC VALVE REPLACEMENT  2009   . HX COLECTOMY  2016    FOR OBSTRUCTION   . HX TURP     . HX UPPER ENDOSCOPY  04/05/2016   . INCISIONAL HERNIA REPAIR     . VENTRAL HERNIA REPAIR           Medications Prior to Admission     Prescriptions    amitriptyline (ELAVIL) 10 mg Oral Tablet    Take 10 mg by mouth Every night    amLODIPine (NORVASC) 10 mg Oral Tablet    Take 10 mg by mouth Once a day    calcium citrate-vitamin D3 (CITRACAL) 200 mg calcium -250 unit Oral Tablet    Take by mouth Once a day    cyclobenzaprine (FLEXERIL) 10 mg Oral Tablet    TAKE 1 TABLET BY MOUTH ONCE EVERY NIGHT    docusate sodium (COLACE) 100 mg Oral Capsule    Take 100 mg by mouth Twice daily    ergocalciferol, vitamin D2, (VITAMIN D2 ORAL)    Take 1 Tab by mouth Once a day    fluticasone  propionate (FLONASE) 50 mcg/actuation Nasal Spray, Suspension    2 Sprays by Each Nostril route Once a day    levETIRAcetam (KEPPRA) 250 mg Oral Tablet    Take 500 mg by mouth Twice daily     levothyroxine (SYNTHROID) 100 mcg Oral Tablet    Take 100 mcg by mouth Every morning    losartan (COZAAR) 50 mg Oral Tablet    Take 50 mg by mouth Once a day    melatonin 5 mg Oral Tablet    Take 10 mg by mouth Every night     multivitamin Oral Tablet    Take 1 Tab by mouth Once a day    OM-3-DHA-EPA-Fish Oil-Vit D3 (FISH OIL-VIT D3) 300-1,000-1,000 mg-mg-unit Oral Capsule    Take 3 Caps by mouth Once a day Dosage 2000, mg fish oil/1000 iu Vitamin d3     pantoprazole (PROTONIX) 40 mg Oral Tablet, Delayed Release (E.C.)    Take 40 mg by mouth Twice daily    polyethylene glycol (MIRALAX) 17 gram/dose Oral Powder    Take 17 g by mouth Once a day    tamsulosin (FLOMAX) 0.4 mg Oral Capsule    Take 0.4 mg by mouth Every evening after dinner          Current Facility-Administered Medications:  amitriptyline (ELAVIL) tablet 10 mg Oral NIGHTLY   calcium citrate + vitamin D (CITRACAL) 200mg  (elemental Ca)-200 unit tab 1 Tab Oral Daily   electrolyte-A (PLASMALYTE-A) premix infusion  Intravenous Continuous   levETIRAcetam (KEPPRA) tablet 500 mg Oral 2x/day   levothyroxine (SYNTHROID) tablet 100 mcg Oral QAM   [Held by provider] losartan (COZAAR) tablet 50 mg Oral Daily   melatonin tablet 3 mg Oral HS PRN   multivitamin tablet 1 Tab Oral Daily   NS bolus infusion 40 mL 40 mL Intravenous Once PRN   NS bolus infusion 40 mL 40 mL Intravenous Once PRN   NS flush syringe 2 mL Intracatheter Q8HRS   And      NS flush syringe 2-6 mL Intracatheter Q1 MIN PRN   pantoprazole (PROTONIX) delayed release tablet 40 mg Oral 2x/day   psyllium (METAMUCIL) oral powder 1 Packet Oral 2x/day   tamsulosin (FLOMAX) capsule 0.4 mg Oral Daily after [employer]  Allergies   Allergen Reactions   . Statins-Hmg-Coa Reductase Inhibitors Nausea/ Vomiting     Social History       Tobacco Use   . Smoking status: Never Smoker   . Smokeless tobacco: Never Used   Substance Use Topics   . Alcohol use: Yes     Alcohol/week: 1.0 standard drinks     Types: 1 Glasses of wine per week     Binge frequency: Less than monthly       Family History:  Family Medical History:     Problem Relation (Age of Onset)    Blood Clots Paternal Grandmother    Coronary Artery Disease Mother, Father    Epilepsy Sister    Heart Attack Father    High Cholesterol Mother, Father    Hypertension (High Blood Pressure) Sister, Sister    Stroke Mother    Thyroid Disease Mother      Unsure about obstructive sleep apnea in the family.     ROS:   Constitutional:  Negative for fever, chills. Does have some days with severe sleepiness and fatigue. Other days no issues.   Eyes:  Negative for visual disturbance, blurred vision, irritation  Ears, Nose, Mouth, Throat, Face:  Negative for ear discharge, nasal congestion, sore throat  Respiratory:  Negative for cough, sputum, dyspnea on exertion. Otherwise per HPI.   Cardiovascular:  Negative for chest pain, dyspnea, palpitations.   Gastrointestinal: per HPI   Genitourinary:  Negative for frequency, dysuria, hematuria. Some nocturia.   Integument/breast:  Negative for rash, pruritus, lesions  Hematologic:  Negative for bleeding, easy bruising, lymphadenopathy  Musculoskeletal:  Negative for myalgias, arthralgias, back pain, muscle weakness  Neurological:  Negative for headaches, dizziness. Reports history of seizure disorder.   Behavioral/Psych:  Negative for depression, anxiety    EXAM:  Temperature: 36.6 C (97.9 F)  Heart Rate: 96  BP (Non-Invasive): 120/64  Respiratory Rate: 16  SpO2: 97 %  Pain Score (Numeric, Faces): 0  General:  no acute distress; alert   Eyes: Conjunctiva clear.  Pupils equal, round, reactive.  No scleral icterus.    HENT:  Normocephalic, atraumatic. Mucous membranes moist.  High arched palate. Mallampati III. Retrognathic.    Neck: Supple, trachea midline.   No JVD or carotid bruit noted.  Lungs: Clear bilaterally.  No wheezes, rales, or rhonchi noted.    Cardiovascular:  Regular rate and rhythm.  No murmurs, rubs, or gallops noted.    Abdomen:  mild distension; Soft, non-tender.  Bowel sounds normal.  Extremities: No cyanosis or edema.    Skin:  No rashes or lesions noted.    Neurologic:  Cranial nerves, motor, sensory grossly normal    Labs:  I have reviewed all lab results.        Weldon Picking, MD

## 2017-11-28 NOTE — Telephone Encounter (Signed)
-----   Message from Lyndel Safe, [place] sent at 11/24/2017  7:54 AM EDT -----  Results reviewed. Please advise patient that labs were fine except his thyroid labs. He is taking too much Levothyroxine. We need to decrease dose. Is he currently taking 151mcg daily? Lyndel Safe, PA-C  11/24/2017, 07:54

## 2017-11-28 NOTE — Nurses Notes (Signed)
Hilton Cork from lab called and reported critical lab value HGB 6.5. Kateri Plummer Hadi paged regarding results. Awaiting any additional orders.

## 2017-11-28 NOTE — Care Plan (Signed)
[facility]  Occupational Therapy Initial Evaluation    Patient Name: Richard [name]  Date of Birth: 06-Apr-1960  Height: Height: 175.3 cm (5' 9.02")  Weight: Weight: 86.7 kg (191 lb 2.2 oz)  Room/Bed: 929/A  Payor: [employer] MEDICARE / Plan: [place] MEDICARE ADVANTAGE PPO / Product Type: PPO /     Assessment:   Patient tolerated OT evaluation well this date. Patient presents with decreased balance, decreased activity tolerance limiting self care performance and functional mobility. With continued mobilization, patient is expected to be able to go home with assist.       Discharge Needs:   Equipment Recommendation: none anticipated    Discharge Disposition: home with assist, home with home health    JUSTIFICATION OF DISCHARGE RECOMMENDATION   Based on current diagnosis, functional performance prior to admission, and current functional performance, this patient requires continued OT services in home with assist, home with home health  in order to achieve significant functional improvements.    Plan:   Current Intervention: ADL retraining, balance training, endurance training, strengthening, transfer training    To provide Occupational therapy services 1x/day, minimum of 2x/week, until discharge.       The risks/benefits of therapy have been discussed with the patient/caregiver and he/she is in agreement with the established plan of care.       Subjective & Objective        11/28/17 0950   Therapist Pager   OT Assigned/ Pager # [phone]   Rehab Session   Document Type evaluation   Total OT Minutes: 12   Patient Effort, Rehab Treatment Comment Richard [name] is a 57 y.o. male who presented to ED today after what he described as large volume bright red blood in stool.  He reports that he was also having N/V and dizziness when the bleeding began and actually had an episode of syncope today.  He has had GI bleed before in 2017 after partial colectomy; denies having any problems with bleeding  ever since; not on any home blood thinners.   General Information   Patient Profile Reviewed? yes   Onset of Illness/Injury or Date of Surgery 11/24/17   Pertinent History of Current Functional Problem Richard [name] is a 57 y.o. male who presented to ED today after what he described as large volume bright red blood in stool.  He reports that he was also having N/V and dizziness when the bleeding began and actually had an episode of syncope today.  He has had GI bleed before in 2017 after partial colectomy; denies having any problems with bleeding ever since; not on any home blood thinners.   Medical Lines PIV Line   Respiratory Status room air   Existing Precautions/Restrictions full code;fall precautions   Pre Treatment Status   Pre Treatment Patient Status Patient supine in bed;Call light within reach;Telephone within reach   Support Present Pre Treatment  None   Communication Pre Treatment  Nurse   Living Environment   Lives With spouse   Living Arrangements house   Home Assessment: Stairs in [place] Accessibility stairs within home   Number of Stairs Within Home 4   [place] at Home outside, present on right side   Living Environment Comment patient lives with wife in 1 story home with 3-4 STE   Functional Level Prior   Ambulation 0 - independent   Transferring 0 - independent   Toileting 0 - independent  Bathing 0 - independent   Dressing 0 - independent   Eating 0 - independent   Prior Functional Level Comment independent   Self-Care   Dominant Hand right   Equipment Currently Used at Home no   Vital Signs   O2 Delivery Pre Treatment room air   O2 Delivery Post Treatment room air   Pain Assessment   Pre/Post Treatment Pain Comment No pain indicated   Coping/Psychosocial   Observed Emotional State calm;cooperative   Coping/Psychosocial Response Interventions   Plan Of Care Reviewed With patient   Cognitive Assessment/Interventions   Behavior/Mood Observations alert;cooperative   Attention mild  impairment;needs re-direction   Follows Commands follows one step commands   RUE Assessment   RUE Assessment WFL- Within Functional Limits   LUE Assessment   LUE Assessment WFL- Within Functional Limits   Grip Strength   Grip Left (4/5) good, left   Right Grip (4/5) good, right   Mobility Assessment/Training   Mobility Comment Patient transitioned from supine to sitting EOB with standby assist. Patient completed STS with CG and ambulated ~100' with [facility]   Bed Mobility Assessment/Treatment   Bed Mobility, Assistive Device Head of Bed Elevated   Supine-Sit Independence stand-by assistance   Transfer Assessment/Treatment   Sit-Stand Independence contact guard assist   Stand-Sit Independence contact guard assist   Sit-Stand-Sit, Assist Device handheld assist   Lower Body Dressing Assessment/Training   Position sitting   DRESSING ASSESSED Don Socks   Independence Level  modified independence   Balance Skill Training   Sitting Balance: Static good balance   Sitting, Dynamic (Balance) fair + balance   Sit-to-Stand Balance fair + balance   Standing Balance: Static fair balance   Standing Balance: Dynamic fair balance   Post Treatment Status   Post Treatment Patient Status Patient supine in bed;Call light within reach;Telephone within reach;Sitter select activated   Support Present Post Treatment  None   Care Plan Goals   OT Rehab Goals Transfer Training Goal 2;Toileting Goal;LB Dressing Goal   LB Dressing Goal   LB Dressing Goal, Date Established 11/28/17   LB Dressing Goal, Time to Achieve by discharge   LB Dressing Goal, Activity Type all lower body dressing tasks   LB Dressing Goal, Independence Level modified independence   Toileting Goal   Toileting Goal, Date Established 11/28/17   Toileting Goal, Time to Achieve by discharge   Toileting Goal, Activity Type all toileting tasks   Toileting Goal, Independence Level modified independence   Transfer Training Goal 2   Transfer Training Goal, Date Established 11/28/17      Transfer Training Goal, Time to Achieve by discharge   Transfer Training Goal, Activity Type toilet;sit-to-stand/stand-to-sit;bed-to-chair/chair-to-bed   Transfer Training Goal, Independence Level modified independence   Planned Therapy Interventions, OT Eval   Planned Therapy Interventions ADL retraining;balance training;endurance training;strengthening;transfer training   Occupational Therapy Clinical Impression   Functional Level at Time of Session Patient tolerated OT evaluation well this date. Patient presents with decreased balance, decreased activity tolerance limiting self care performance and functional mobility. With continued mobilization, patient is expected to be able to go home with assist.    Criteria for Skilled Therapeutic Interventions Met (OT) yes;skilled treatment is necessary   Rehab Potential good, to achieve stated therapy goals   Therapy Frequency 1x/day;minimum of 2x/week   Predicted Duration of Therapy until discharge   Anticipated Equipment Needs at Discharge none anticipated   Anticipated Discharge Disposition home with assist;home with home health   Highest level  of Mobility score   Exercise Level Performed 7- Walked 25 feet or more   Evaluation Complexity Justification   Occupational Profile Review Expanded review   Performance Deficits 1-3 deficits   Clinical Decision Making Low analytic complexity   Evaluation Complexity Low       Therapist:   Trellis Moment, OT   Pager #: [phone]

## 2017-11-28 NOTE — Care Plan (Signed)
Problem: Adult Inpatient Plan of Care  Goal: Plan of Care Review  Outcome: Ongoing (see interventions/notes)  Goal: Patient-Specific Goal (Individualization)  Outcome: Ongoing (see interventions/notes)  Goal: Absence of Hospital-Acquired Illness or Injury  Outcome: Ongoing (see interventions/notes)  Goal: Optimal Comfort and Wellbeing  Outcome: Ongoing (see interventions/notes)  Goal: Rounds/Family Conference  Outcome: Ongoing (see interventions/notes)     Problem: Fall Injury Risk  Goal: Absence of Fall and Fall-Related Injury  Outcome: Ongoing (see interventions/notes)     Problem: Adjustment to Illness (Gastrointestinal Bleeding)  Goal: Optimal Coping with Acute Illness  Outcome: Ongoing (see interventions/notes)     Problem: Bleeding (Gastrointestinal Bleeding)  Goal: Hemostasis  Outcome: Ongoing (see interventions/notes)

## 2017-11-28 NOTE — Nurses Notes (Addendum)
1234 - Infusion of 2nd unit of irradiated, leukoreduced pRBCs started for HGB result 6.5. Verified with Fransisca Connors, RN. Patient educated to signs and symptoms of transfusion reaction. Patient verbalized understanding. Will continue to monitor and obtain vitals.     1425 - Transfusion complete. No adverse reaction suspected. VS per Flowsheet.     1002 - Infusion of first unit of irradiated, leukoreduced pRBCs started for HGB result 6.5. Verified with Kerney Elbe, RN. Patient educated to signs and symptoms of transfusion reaction. Patient verbalized understanding. Will continue to monitor and obtain vitals.     1220 - Transfusion complete. No adverse reaction suspected. VS per Flowsheet.

## 2017-11-29 LAB — BASIC METABOLIC PANEL
ANION GAP: 6 mmol/L (ref 4–13)
BUN/CREA RATIO: 15 (ref 6–22)
BUN: 17 mg/dL (ref 8–25)
CALCIUM: 8.2 mg/dL — ABNORMAL LOW (ref 8.5–10.2)
CHLORIDE: 105 mmol/L (ref 96–111)
CO2 TOTAL: 27 mmol/L (ref 22–32)
CREATININE: 1.17 mg/dL (ref 0.62–1.27)
ESTIMATED GFR: >60 mL/min/1.73mˆ2 (ref >60)
GLUCOSE: 114 mg/dL (ref 65–139)
POTASSIUM: 4.4 mmol/L (ref 3.5–5.1)
SODIUM: 138 mmol/L (ref 136–145)

## 2017-11-29 LAB — CBC WITH DIFF
BASOPHIL #: <0.1 x10ˆ3/uL (ref <=0.20)
BASOPHIL %: 0 %
EOSINOPHIL #: 0.2 x10ˆ3/uL (ref <=0.50)
EOSINOPHIL %: 3 %
HCT: 26.3 % — ABNORMAL LOW (ref 38.9–52.0)
HGB: 8.4 g/dL — ABNORMAL LOW (ref 13.4–17.5)
IMMATURE GRANULOCYTE #: <0.1 x10ˆ3/uL (ref <0.10)
IMMATURE GRANULOCYTE %: 1 % (ref 0–1)
LYMPHOCYTE #: 0.77 x10ˆ3/uL — ABNORMAL LOW (ref 1.00–4.80)
LYMPHOCYTE %: 10 %
MCH: 29.9 pg (ref 26.0–32.0)
MCHC: 31.9 g/dL (ref 31.0–35.5)
MCV: 93.6 fL (ref 78.0–100.0)
MONOCYTE #: 0.68 x10ˆ3/uL (ref 0.20–1.10)
MONOCYTE %: 9 %
MPV: 11.2 fL (ref 8.7–12.5)
NEUTROPHIL #: 6.04 10*3/uL (ref 1.50–7.70)
NEUTROPHIL %: 77 %
PLATELETS: 100 x10ˆ3/uL — ABNORMAL LOW (ref 150–400)
RBC: 2.81 x10ˆ6/uL — ABNORMAL LOW (ref 4.50–6.10)
RDW-CV: 16 % — ABNORMAL HIGH (ref 11.5–15.5)
WBC: 7.8 x10ˆ3/uL (ref 3.7–11.0)

## 2017-11-29 LAB — MAGNESIUM: MAGNESIUM: 1.7 mg/dL (ref 1.6–2.6)

## 2017-11-29 LAB — H & H
HCT: 25.8 % — ABNORMAL LOW (ref 38.9–52.0)
HCT: 23.7 % — ABNORMAL LOW (ref 38.9–52.0)
HCT: 22.6 % — ABNORMAL LOW (ref 38.9–52.0)
HGB: 8.3 g/dL — ABNORMAL LOW (ref 13.4–17.5)
HGB: 7.7 g/dL — ABNORMAL LOW (ref 13.4–17.5)
HGB: 7.3 g/dL — ABNORMAL LOW (ref 13.4–17.5)

## 2017-11-29 LAB — PHOSPHORUS: PHOSPHORUS: 3.2 mg/dL (ref 2.4–4.7)

## 2017-11-29 MED ADMIN — lactated Ringers intravenous solution: INTRAVENOUS | @ 02:00:00 | NDC 00338011704

## 2017-11-29 NOTE — Discharge Instructions (Signed)
Discharge Recommendations/ Plan:Discharge [id number] with Home Health (code 6)      Resources: Nursing Please call report to [facility] at [phone]

## 2017-11-29 NOTE — Care Plan (Signed)
Pt was placed on a sleep study using Auto CPAP. Pt only tolerated the mask being on for a few hours last night and did not want to wear it because it was "too much pressure". The sleep report is as follows        11/29/17 0200   Sleep Report   Avgerage Usage 1.7   Used Hours 1.7   Pressure 14.8   Insp. Pressure 14.8   AHI 2.3   Total Al 0   Central Al 0   %Spont T 100   % Spont C 100         Respiratory to follow

## 2017-11-29 NOTE — Progress Notes (Signed)
[facility]  Internal Medicine  DAILY PROGRESS NOTE     Richard [name], 57 y.o., male MRN: [id number]   DOB: 1960-12-28 Date of Service: 11/29/2017   PCP: Gavin Potters, APRN Code Status: Full Code discussed with patient     SUBJECTIVE:   Richard [name] was sitting comfortably in chair this morning. He reports having 4 bloody stools overnight. He was pale and lightheaded at night. He thinks that bleed is actually decreasing in quantity. He was able to sleep well.    Denies chest pain, SOB, abdominal pain or n/v.   PHYSICAL EXAM:     Temperature: 36.5 C (97.7 F) Heart Rate: 96 BP (Non-Invasive): 121/75   Respiratory Rate: 18 SpO2: 94 % Weight: 95.9 kg (211 lb 6.7 oz)       Intake/Output Summary (Last 24 hours) at 11/29/2017 1455  Last data filed at 11/29/2017 1311  Gross per 24 hour   Intake 400 ml   Output 200 ml   Net 200 ml     Last Bowel Movement: 11/29/17    General:  Appears acutely ill  HEENT: Mucous membranes moist and pale  Eyes: PERRL, Conjunctiva non-icteric  Neck: Soft, supple  CV: Regular rhythm and increased rate, no murmurs  Pulm: Clear to auscultation bilaterally no wheezing  Abd: Soft, Nontender nondistended with normal bowel sounds  Extrem: No cyanosis or edema  Skin: Pale, cold and dry, no rashes  Neuro:  Patient appears alert and oriented x 3    DIAGNOSTIC STUDIES:     I have personally reviewed labs & imaging.   CBC Differential   Recent Labs     11/27/17  0721  11/28/17  0246  11/29/17  0134 11/29/17  0528 11/29/17  0916   WBC 7.4  --  10.1  --   --  7.8  --    HGB 7.6*   < > 7.1*   < > 8.3* 8.4* 7.7*   HCT 23.2*   < > 21.3*   < > 25.8* 26.3* 23.7*   PLTCNT 85*  --  100*  --   --  100*  --     < > = values in this interval not displayed.    Recent Labs     11/27/17  0721 11/28/17  0246 11/29/17  0528   PMNS 73 83 77   MONOCYTES 10 7 9    BASOPHILS 1  <0.10 0  <0.10 0  <0.10   PMNABS 5.47 8.42* 6.04   LYMPHSABS 1.03 0.80* 0.77*   MONOSABS 0.71 0.71 0.68   EOSABS 0.15 <0.10 0.20      BMP  LFTs   Recent Labs     11/29/17  0528   SODIUM 138   POTASSIUM 4.4   CHLORIDE 105   CO2 27   BUN 17   CREATININE 1.17   GLUCOSENF 114   ANIONGAP 6   BUNCRRATIO 15   GFR >60   CALCIUM 8.2*   MAGNESIUM 1.7   PHOSPHORUS 3.2    No results found for this encounter   CoAgs Blood Gas:   Recent Labs     11/28/17  1548   PROTHROMTME 12.8   INR 1.09   APTT 28.2    No results found for this encounter    Cardiac Markers Lipid Panel   No results for input(s): TROPONINI, CKMB, MBINDEX, BNP in the last 72 hours. No results found for this encounter   Urine Analysis Other Labs  No results found for this encounter No results found for this encounter    Invalid input(s): PRL      EKG: personally reviewed by me and showed normal sinus rhythm with QTc of 432. (11/23/2017)    CURRENT INPATIENT MEDICATION LIST:       Current Facility-Administered Medications:  amitriptyline (ELAVIL) tablet 10 mg Oral NIGHTLY   calcium citrate + vitamin D (CITRACAL) 200mg  (elemental Ca)-200 unit tab 1 Tab Oral Daily   electrolyte-A (PLASMALYTE-A) premix infusion  Intravenous Continuous   levETIRAcetam (KEPPRA) tablet 500 mg Oral 2x/day   levothyroxine (SYNTHROID) tablet 100 mcg Oral QAM   [Held by provider] losartan (COZAAR) tablet 50 mg Oral Daily   melatonin tablet 3 mg Oral HS PRN   multivitamin tablet 1 Tab Oral Daily   NS flush syringe 2 mL Intracatheter Q8HRS   And      NS flush syringe 2-6 mL Intracatheter Q1 MIN PRN   NS flush syringe 10-30 mL Intracatheter Q8HRS   NS flush syringe 20-30 mL Intracatheter Q1 MIN PRN   pantoprazole (PROTONIX) delayed release tablet 40 mg Oral 2x/day   psyllium (METAMUCIL) oral powder 1 Packet Oral 2x/day   tamsulosin (FLOMAX) capsule 0.4 mg Oral Daily after Dinner       ASSESSMENT:     Richard. [name] remains hospitalized for painless per rectal bleeding 2/2 diverticulosis with clotted blood in diverticula. PMH is significant for bowel obstruction s/p resection, aortic valve replacement, seizures, hypothyroidsm and  BPH.    - Level of care at this time is stepdown.    Active Hospital Problems    Diagnosis Date Noted   . Principle Problem: Acute GI bleeding [K92.2] 11/23/2017      Resolved Hospital Problems   No resolved problems to display.     PLAN:     Anemia of acute blood loss/Diverticular Bleeding:  Colonoscopy showed diverticulosis with clotted blood in diverticula.    On high fiber diet.  Hb levels improved to 8.4 after 2 PRBC units transfusion yesterday.   On 2L O2 by NC  On maintenance IV fluids due to mildly elevated creatinine- creatinine improving.   Trending H&H q12    Seizures:  - Past h/o seizures since 2000  - on Keppra 500mg  BID at 9 am and 10 pm.     Aortic valve replacement:  - 10 years ago, bioprosthetic valve.   - Chart review shows a TTE done in 8/19 that shows an EF of 60-70% with moderate stenosis of bioprosthetic aortic valve with mean gradient 36 mmHg.     Hypertension:  - home Amlodepine 10mg  QD on hold due to normotension  - home Losartan 50 mg QD on hold due to normotension    Dyslipidemias:  - h/o intolerance to statins  - on Fish oil at home    BPH:  - Flomax 0.4mg  oral qd    Hypothyroidism;   - Synthroid 123mcg Oral Qd      DVT PPx: Moderate risk for DVT but anticoagulation contraindicated at this time    DISCHARGE & DISPOSITION PLANNING:     Anticipated discharge needs: TBD  Anticipated discharge location: TBD    Creola Corn, MD      I saw and examined the patient.  I reviewed the resident's note.  I agree with the findings and plan of care as documented in the resident's note.  Any exceptions/additions are edited/noted.    Julious Payer, MD

## 2017-11-29 NOTE — Care Plan (Signed)
Plan of care reviewed. Pt AOx4 and cooperative with care this shift. Denies pain. MIVF continued. Monitoring hgb. Still with some blood in stools. Assessment per doc flow. Pt free from falls this shift. Needs met with hourly rounding. Pt resting at this time with call bell in reach. Wheels locked and bed in lowest position. Will continue to monitor.   Problem: Adult Inpatient Plan of Care  Goal: Plan of Care Review  Outcome: Ongoing (see interventions/notes)     Problem: Adjustment to Illness (Gastrointestinal Bleeding)  Goal: Optimal Coping with Acute Illness  Outcome: Ongoing (see interventions/notes)     [facility] Highest Level of Mobility Goal      Date: 11/29/17     JH-HLM Goal: 6    Exercise Level: 6- Walked 10 steps or more    Goal Outcome: achieved

## 2017-11-29 NOTE — Nurses Notes (Signed)
11/29/17 0942   Orthostatic Vitals Set #1   Time 0925   Initials JR   Patient Position Supine   Heart Rate 93   Blood Pressure 119/63   BP Source Cuff;Monitor;Right Arm   O2 SAT 95   Orthostatic Vitals Set #2   Time 0927   Initials JR   Patient Position Sitting   Heart Rate 101   Blood Pressure 130/76   BP Source  Cuff;Monitor;Right Arm   O2 SAT 95   Orthostatic Vitals Set #3   Time 0928   Initials JR   Patient Position Standing   Heart Rate 109   Blood Pressure 112/61   BP Source  Cuff;Monitor;Right Arm   O2 SAT 95   Orthostatic bp's above

## 2017-11-29 NOTE — Nurses Notes (Signed)
Results for OLANREWAJU, OSBORN (MRN [id number]) as of 11/29/2017 17:41   Ref. Range 11/29/2017 15:24   HGB Latest Ref Range: 13.4 - 17.5 g/dL 7.3 (L)   HCT Latest Ref Range: 38.9 - 52.0 % 22.6 (L)   Notified service

## 2017-11-29 NOTE — Care Management Notes (Addendum)
[facility] Management Note    Patient Name: Richard [name]  Date of Birth: 13-Sep-1960  Sex: male  Date/Time of Admission: 11/23/2017  4:21 PM  Room/Bed: 710/A  Payor: [place] MEDICARE / Plan: [place] MEDICARE ADVANTAGE PPO / Product Type: PPO /    LOS: 6 days   Primary Care Providers:  Gavin Potters, APRN, APRN (General)    Admitting Diagnosis:  Acute GI bleeding [K92.2]    Assessment:      11/29/17 1332   Assessment Details   Assessment Type Continued Assessment   Date of Care Management Update 11/29/17   Date of Next DCP Update 12/01/17   Care Management Plan   Discharge Planning Status plan in progress   Projected Discharge Date 11/30/17   Discharge Needs Assessment   Discharge Facility/Level of Care Needs Home with Home Health (code 6)   Transportation Available family or friend will provide;car       Discharge Plan:  Home with [place] (code 6)  [place] discussed [place] with patient.  Gave Choice for [facility].  [facility] placed HH order, awaiting service to sign orders.  Referral sent to [facility]. Patient states he may be discharged tomorrow pending labs.  Will continue to monitor.  14:21  [facility] has accepted patient.  Will continue to monitor.    The patient will continue to be evaluated for developing discharge needs.     Case Manager: Baxter Kail, [place] COORDINATOR  Phone: [phone]

## 2017-11-29 NOTE — Nurses Notes (Signed)
Paged Dr.Sarah Sofka patient had a large bloody dark red stool this morning. Stool brown during shift x4. Awaiting new orders. All care done in collaboration with RN.

## 2017-11-30 LAB — BASIC METABOLIC PANEL
ANION GAP: 8 mmol/L (ref 4–13)
BUN/CREA RATIO: 17 (ref 6–22)
BUN: 22 mg/dL (ref 8–25)
CALCIUM: 8 mg/dL — ABNORMAL LOW (ref 8.5–10.2)
CHLORIDE: 107 mmol/L (ref 96–111)
CO2 TOTAL: 26 mmol/L (ref 22–32)
CREATININE: 1.26 mg/dL (ref 0.62–1.27)
ESTIMATED GFR: 59 mL/min/1.73mˆ2 — ABNORMAL LOW (ref >60)
GLUCOSE: 146 mg/dL — ABNORMAL HIGH (ref 65–139)
POTASSIUM: 4.3 mmol/L (ref 3.5–5.1)
POTASSIUM: 4.3 mmol/L (ref 3.5–5.1)
SODIUM: 141 mmol/L (ref 136–145)
SODIUM: 141 mmol/L (ref 136–145)

## 2017-11-30 LAB — CBC WITH DIFF
BASOPHIL #: <0.1 x10ˆ3/uL (ref <=0.20)
BASOPHIL %: 0 %
EOSINOPHIL #: 0.15 x10ˆ3/uL (ref <=0.50)
EOSINOPHIL %: 2 %
HCT: 22.6 % — ABNORMAL LOW (ref 38.9–52.0)
HCT: 22.6 % — ABNORMAL LOW (ref 38.9–52.0)
HGB: 7.4 g/dL — ABNORMAL LOW (ref 13.4–17.5)
IMMATURE GRANULOCYTE #: <0.1 x10ˆ3/uL (ref <0.10)
IMMATURE GRANULOCYTE %: 1 % (ref 0–1)
LYMPHOCYTE #: 0.79 10*3/uL — ABNORMAL LOW (ref 1.00–4.80)
LYMPHOCYTE #: 0.79 x10ˆ3/uL — ABNORMAL LOW (ref 1.00–4.80)
LYMPHOCYTE %: 10 %
MCH: 30.6 pg (ref 26.0–32.0)
MCHC: 32.7 g/dL (ref 31.0–35.5)
MCHC: 32.7 g/dL (ref 31.0–35.5)
MCV: 93.4 fL (ref 78.0–100.0)
MONOCYTE #: 0.96 x10ˆ3/uL (ref 0.20–1.10)
MONOCYTE %: 12 %
MPV: 11.2 fL (ref 8.7–12.5)
NEUTROPHIL #: 5.77 10*3/uL (ref 1.50–7.70)
NEUTROPHIL %: 75 %
PLATELETS: 89 x10ˆ3/uL — ABNORMAL LOW (ref 150–400)
RBC: 2.42 x10ˆ6/uL — ABNORMAL LOW (ref 4.50–6.10)
RDW-CV: 15.6 % — ABNORMAL HIGH (ref 11.5–15.5)
WBC: 7.8 x10ˆ3/uL (ref 3.7–11.0)

## 2017-11-30 LAB — MAGNESIUM: MAGNESIUM: 1.8 mg/dL (ref 1.6–2.6)

## 2017-11-30 LAB — H & H
HCT: 19.2 % — ABNORMAL LOW (ref 38.9–52.0)
HCT: 21 % — ABNORMAL LOW (ref 38.9–52.0)
HCT: 24.5 % — ABNORMAL LOW (ref 38.9–52.0)
HGB: 6.2 g/dL — CL (ref 13.4–17.5)
HGB: 6.8 g/dL — CL (ref 13.4–17.5)
HGB: 7.7 g/dL — ABNORMAL LOW (ref 13.4–17.5)

## 2017-11-30 LAB — LAVENDER TOP TUBE

## 2017-11-30 LAB — PHOSPHORUS: PHOSPHORUS: 3.2 mg/dL (ref 2.4–4.7)

## 2017-11-30 MED ORDER — SODIUM CHLORIDE 0.9 % IV BOLUS
40.00 mL | INJECTION | Freq: Once | Status: AC | PRN
Start: 2017-11-30 — End: 2017-11-30

## 2017-11-30 MED ADMIN — sodium chloride 0.9 % intravenous solution: @ 14:00:00

## 2017-11-30 MED ADMIN — electrolyte-A intravenous solution: ORAL | @ 21:00:00 | NDC 00338022104

## 2017-11-30 NOTE — Nurses Notes (Signed)
Paged Dr. Verdis Frederickson to notify to Abnormal Critical Lab values Hbg 6.2 Hct 19.2. Dark Brown loose stools x 2, bright red blood tint on toilet paper. VS stable.     Results for JAICEON, COLLISTER (MRN [id number]) as of 11/30/2017 01:28   Ref. Range 11/30/2017 00:18   HGB Latest Ref Range: 13.4 - 17.5 g/dL 6.2 (LL)   HCT Latest Ref Range: 38.9 - 52.0 % 19.2 (L)

## 2017-11-30 NOTE — Nurses Notes (Signed)
Doctor notified of abnormal critical lab value. Doctor visited patient for further assessment. Order for referral for GI consult to be placed. All care done in collaboration with RN.

## 2017-11-30 NOTE — Care Plan (Signed)
Patient alert, orient x 4. Patient's behavior calm, cooperative, pleasant, mood sad, anxiety and depressed, affect fearful, anxious. Patient refused bed alarms at present time. Patient's H&H lab value continued to monitor Patient denies pain medications at present time. Patient education given about fall, safety, labs values, current health status. Patient receptive patient education given. Patient offered bedside commode x 6 during shift. Patient's bowels range from dark brown, burgundy bright red colored loose to soft semi formed stools. Patient urinating without difficulties. Patient continued IV fluids as ordered see MAR. Patient repositioned for comfort. Patient stated "worry not sure why continued bleeding." Patient given therapeutic communication, empathetic listening, active listening, positive reassurance. Patient call light present, bed in low position, no fall noted during shift. Patient continued to monitor for safety and health changes. All care done in collaborations with RN.     Problem: Adult Inpatient Plan of Care  Goal: Plan of Care Review  Outcome: Ongoing (see interventions/notes)  Flowsheets (Taken 11/30/2017 0621)  Plan of Care Reviewed With: patient; spouse  Progress: no change  Goal: Patient-Specific Goal (Individualization)  Outcome: Ongoing (see interventions/notes)  Flowsheets (Taken 11/29/2017 0621)  Individualized Care Needs: Patient stated "help getting up and going to the bathroom."  Anxieties, Fears or Concerns: Patient stated "worried about continued bleeding."  Patient-Specific Goals (Include Timeframe): Patient stated "to stop bleeding, not be so tired and go home"  Goal: Absence of Hospital-Acquired Illness or Injury  Outcome: Ongoing (see interventions/notes)  Goal: Optimal Comfort and Wellbeing  Outcome: Ongoing (see interventions/notes)

## 2017-11-30 NOTE — Nurses Notes (Signed)
Paged Med 2 on call- Pt wanting to know if can d/c calorie count and just have Regular diet  Judithann Villamar Abigail Miyamoto, RN  11/30/2017, 11:46

## 2017-11-30 NOTE — Nurses Notes (Signed)
Paged Dr. Leta Jungling to notify to Abnormal Critical Lab values Hbg 6.2 Hct 19.2. Dark Brown loose stools x 2, bright red blood tint on toilet paper. VS stable. Awaiting new orders. All care done in collaboration with RN Larene Beach.     Results for Richard, [name] (MRN [id number]) as of 11/30/2017 07:26   Ref. Range 11/30/2017 06:49   HGB Latest Ref Range: 13.4 - 17.5 g/dL 6.8 (LL)   HCT Latest Ref Range: 38.9 - 52.0 % 21.0 (L)

## 2017-11-30 NOTE — Care Management Notes (Signed)
[facility] Management Note    Patient Name: Richard [name]  Date of Birth: 03-Feb-1961  Sex: male  Date/Time of Admission: 11/23/2017  4:21 PM  Room/Bed: 710/A  Payor: [place] / Plan: [place] MEDICARE ADVANTAGE PPO / Product Type: PPO /    LOS: 7 days   Primary Care Providers:  Gavin Potters, APRN, APRN (General)    Admitting Diagnosis:  Acute GI bleeding [K92.2]    Assessment:      11/30/17 0856   Assessment Details   Assessment Type Continued Assessment   Date of Care Management Update 11/30/17   Date of Next DCP Update 12/01/17   Care Management Plan   Discharge Planning Status plan in progress   Projected Discharge Date 12/01/17   Discharge Needs Assessment   Discharge Facility/Level of Care Needs Home with Home Health (code 6)   Transportation Available family or friend will provide;car       Discharge Plan:  Home with [place] (code 6)  Per nursing note patient HGB is 6.8.  Order for blood transfusion on Kardex.  GI to be consulted.  Patient has been accepted by [facility].  Will continue to monitor.    The patient will continue to be evaluated for developing discharge needs.     Case Manager: Baxter Kail, [place] COORDINATOR  Phone: [phone]

## 2017-11-30 NOTE — Care Plan (Signed)
Problem: Adult Inpatient Plan of Care  Goal: Plan of Care Review  Outcome: Ongoing (see interventions/notes)  Goal: Patient-Specific Goal (Individualization)  Outcome: Ongoing (see interventions/notes)  Goal: Absence of Hospital-Acquired Illness or Injury  Outcome: Ongoing (see interventions/notes)  Goal: Optimal Comfort and Wellbeing  Outcome: Ongoing (see interventions/notes)  Goal: Rounds/Family Conference  Outcome: Ongoing (see interventions/notes)     Problem: Fall Injury Risk  Goal: Absence of Fall and Fall-Related Injury  Outcome: Ongoing (see interventions/notes)     Problem: Adjustment to Illness (Gastrointestinal Bleeding)  Goal: Optimal Coping with Acute Illness  Outcome: Ongoing (see interventions/notes)     Problem: Bleeding (Gastrointestinal Bleeding)  Goal: Hemostasis  Outcome: Ongoing (see interventions/notes)      Pt is alert and oriented, resting in bed comfortably. No s/s of distress. Patient was free from falls and needs were met with hourly rounding. Hgb 7.7, pt states no blood in BM. Wife at bedside. Will cont to monitor  Kysha Muralles Abigail Miyamoto, RN  11/30/2017, 17:01

## 2017-11-30 NOTE — Nurses Notes (Signed)
Paged Dr. Verdis Frederickson to notify to Abnormal Critical Lab values Hbg 6.2 Hct 19.2. Dark Brown loose stools x 2, bright red blood tint on toilet paper. VS stable. BP 129/75/P 97 R 18 T 98.2 F O2sat 92% rm air. Awaiting new orders. All care done in collaboration with RN Larene Beach.      Results for KHAIR, CHASTEEN (MRN [id number]) as of 11/30/2017 01:28   Ref. Range 11/30/2017 00:18   HGB Latest Ref Range: 13.4 - 17.5 g/dL 6.2 (LL)   HCT Latest Ref Range: 38.9 - 52.0 % 19.2 (L)

## 2017-11-30 NOTE — Care Plan (Signed)
Patient alert, orient x 4. Patient's behavior calm, cooperative, pleasant, mood anxiety, affect anxious. Patient's spouse present at bedside, patient refused bed alarms at present time. Patient's H&H lab value continued to monitor. See prior nursing notes. Patient completed 1 units packed RBC as ordered for Hgb 6.2 Hct 19.2. Patient tolerated blood transfusion well, no adverse reactions noted, VS stable, no pre medications needed. See blood administration flow sheet. Patient denies pain medications at present time. Patient education given about fall, safety, labs values, current health status. Patient receptive patient education given. Patient offered bedside commode x 6 during shift. Patient's bowels range from dark brown to brown colored soft semi formed stools. Patient urinating without difficulties. Patient continued IV fluids as ordered see MAR. Patient repositioned for comfort. Patient questions answered, time spent, therapeutic communication, empathetic listening, active listening, positive reassurance. Patient call light present, bed in low position, no fall noted during shift. Patient continued to monitor for safety and health changes. All care done in collaborations with RN.     Problem: Adult Inpatient Plan of Care  Goal: Plan of Care Review  11/30/2017 1019 by Lavetta Nielsen, LPN  Outcome: Ongoing (see interventions/notes)  Flowsheets (Taken 11/30/2017 0615)  Plan of Care Reviewed With: spouse; patient  Progress: no change  11/30/2017 1012 by Lavetta Nielsen, LPN  Outcome: Ongoing (see interventions/notes)  Flowsheets (Taken 11/30/2017 4356)  Plan of Care Reviewed With: patient;spouse  Progress: no change  Goal: Patient-Specific Goal (Individualization)  11/30/2017 1019 by Lavetta Nielsen, LPN  Outcome: Ongoing (see interventions/notes)  Flowsheets (Taken 11/29/2017 0621)  Individualized Care Needs: Patient stated "help getting up and going to the bathroom."  Anxieties, Fears or Concerns: Patient stated  "worried about continued bleeding."  Patient-Specific Goals (Include Timeframe): Patient stated "to stop bleeding, not be so tired and go home"  11/30/2017 1012 by Lavetta Nielsen, LPN  Outcome: Ongoing (see interventions/notes)  Flowsheets (Taken 11/29/2017 0621)  Individualized Care Needs: Patient stated "help getting up and going to the bathroom."  Anxieties, Fears or Concerns: Patient stated "worried about continued bleeding."  Patient-Specific Goals (Include Timeframe): Patient stated "to stop bleeding, not be so tired and go home"  Goal: Absence of Hospital-Acquired Illness or Injury  11/30/2017 1019 by Lavetta Nielsen, LPN  Outcome: Ongoing (see interventions/notes)  11/30/2017 1012 by Lavetta Nielsen, LPN  Outcome: Ongoing (see interventions/notes)  Goal: Optimal Comfort and Wellbeing  11/30/2017 1019 by Lavetta Nielsen, LPN  Outcome: Ongoing (see interventions/notes)  11/30/2017 1012 by Lavetta Nielsen, LPN  Outcome: Ongoing (see interventions/notes)

## 2017-11-30 NOTE — Nurses Notes (Signed)
Paged Dr. Verdis Frederickson to notify to Abnormal Critical Lab values Hbg 6.2 Hct 19.2. Dark Brown loose stools x 2, bright red blood tint on toilet paper. VS stable. Awaiting new orders. All care done in collaboration with RN Larene Beach.     Results for Richard [name], Richard [name] (MRN [id number]) as of 11/30/2017 01:28   Ref. Range 11/30/2017 00:18   HGB Latest Ref Range: 13.4 - 17.5 g/dL 6.2 (LL)   HCT Latest Ref Range: 38.9 - 52.0 % 19.2 (L)

## 2017-11-30 NOTE — Progress Notes (Signed)
[facility]  Internal Medicine  DAILY PROGRESS NOTE     Richard [name], 57 y.o., male MRN: [id number]   DOB: [birth year] Date of Service: 11/30/2017   PCP: Gavin Potters, APRN Code Status: Full Code discussed with patient     SUBJECTIVE:   Richard [name] was sitting in bed this morning. Overnight his Hb dropped to 6.2 and he got one PRBC transfusion. He reports having 3 stools overnight. First 2 were blood tinged and last one this morning was blood free, some blood on toilet paper. Patient was lightheaded during these episodes.    Denies chest pain, diarrhea, abdominal pain, fever, chills or n/v.   PHYSICAL EXAM:     Temperature: 37.3 C (99.1 F) Heart Rate: 80 BP (Non-Invasive): (!) 111/48   Respiratory Rate: 18 SpO2: 97 % Weight: 95.9 kg (211 lb 6.7 oz)       Intake/Output Summary (Last 24 hours) at 11/30/2017 0858  Last data filed at 11/30/2017 0342  Gross per 24 hour   Intake 712 ml   Output 425 ml   Net 287 ml     Last Bowel Movement: 11/29/17    General: This is an acutely ill, well developed well nourished male  Eyes: EOMI, no scleral jaundice or conjunctival erythema noted.   HENT: Normocephalic and atraumatic.  Neck:  Supple, symmetric, trachea midline, no masses. The thyroid appears normal. There is no JVD.  Respiratory: Lungs are clear to auscultation. No wheezing, rales or rhonchi noted. There is normal respiratory effort.  Cardiovascular: S1, S2 are normal. The rhythm is regular. There is no click, murmur, rub or gallop noted.   Lower Extremities:  No cyanosis or edema noted.  Gastrointestinal: Normal bowel sounds  The abdomen is soft, non-distended, non-tender with out guarding or rebound. There are no masses appreciated.  Musculoskeletal: No obvious deformity or swelling.   Skin: Cold, dry and pale. No rashes noticed.  Neurologic: CN's Grossly intact, alert and oriented X 3.  Lymphatic: No lymphadenopathy appreciated.  Psychiatric: appears in good mood, fluent in speech and cognition.        DIAGNOSTIC STUDIES:     I have personally reviewed labs & imaging.   CBC Differential   Recent Labs     11/28/17  0246  11/29/17  0528  11/30/17  0018 11/30/17  0417 11/30/17  0649   WBC 10.1  --  7.8  --   --  7.8  --    HGB 7.1*   < > 8.4*   < > 6.2* 7.4* 6.8*   HCT 21.3*   < > 26.3*   < > 19.2* 22.6* 21.0*   PLTCNT 100*  --  100*  --   --  89*  --     < > = values in this interval not displayed.    Recent Labs     11/28/17  0246 11/29/17  0528 11/30/17  0417   PMNS 83 77 75   MONOCYTES 7 9 12    BASOPHILS 0  <0.10 0  <0.10 0  <0.10   PMNABS 8.42* 6.04 5.77   LYMPHSABS 0.80* 0.77* 0.79*   MONOSABS 0.71 0.68 0.96   EOSABS <0.10 0.20 0.15      BMP LFTs   Recent Labs     11/30/17  0417   SODIUM 141   POTASSIUM 4.3   CHLORIDE 107   CO2 26   BUN 22   CREATININE 1.26   GLUCOSENF 146*   ANIONGAP  8   BUNCRRATIO 17   GFR 59*   CALCIUM 8.0*   MAGNESIUM 1.8   PHOSPHORUS 3.2    No results found for this encounter   CoAgs Blood Gas:   No results found for this encounter No results found for this encounter    Cardiac Markers Lipid Panel   No results for input(s): TROPONINI, CKMB, MBINDEX, BNP in the last 72 hours. No results found for this encounter   Urine Analysis Other Labs   No results found for this encounter No results found for this encounter    Invalid input(s): PRL      EKG: personally reviewed by me and showed normal sinus rhythm with QTc of 432. (11/23/2017)    CURRENT INPATIENT MEDICATION LIST:       Current Facility-Administered Medications:  amitriptyline (ELAVIL) tablet 10 mg Oral NIGHTLY   calcium citrate + vitamin D (CITRACAL) 200mg  (elemental Ca)-200 unit tab 1 Tab Oral Daily   electrolyte-A (PLASMALYTE-A) premix infusion  Intravenous Continuous   levETIRAcetam (KEPPRA) tablet 500 mg Oral 2x/day   levothyroxine (SYNTHROID) tablet 100 mcg Oral QAM   [Held by provider] losartan (COZAAR) tablet 50 mg Oral Daily   melatonin tablet 3 mg Oral HS PRN   multivitamin tablet 1 Tab Oral Daily   NS bolus infusion 40 mL  40 mL Intravenous Once PRN   NS flush syringe 2 mL Intracatheter Q8HRS   And      NS flush syringe 2-6 mL Intracatheter Q1 MIN PRN   NS flush syringe 10-30 mL Intracatheter Q8HRS   NS flush syringe 20-30 mL Intracatheter Q1 MIN PRN   pantoprazole (PROTONIX) delayed release tablet 40 mg Oral 2x/day   psyllium (METAMUCIL) oral powder 1 Packet Oral 2x/day   tamsulosin (FLOMAX) capsule 0.4 mg Oral Daily after Dinner       ASSESSMENT:     Richard [name] remains hospitalized for painless per rectal bleeding 2/2 diverticulosis with clotted blood in diverticula. PMH is significant for bowel obstruction s/p resection, aortic valve replacement, seizures, hypothyroidsm and BPH.    - Level of care at this time is stepdown.    Active Hospital Problems    Diagnosis Date Noted   . Principle Problem: Acute GI bleeding [K92.2] 11/23/2017      Resolved Hospital Problems   No resolved problems to display.     PLAN:     Anemia of acute blood loss/Diverticular Bleeding:  Colonoscopy showed diverticulosis with clotted blood in diverticula.    On high fiber diet.  Hb levels improved from 6.2 to 7.4 after 1 PRBC transfusion overnight.   On maintenance IV fluids due to mildly elevated creatinine.   Trending H&H q4h    Seizures:  - Past h/o seizures since 2000  - on Keppra 500mg  BID at 9 am and 10 pm.     Aortic valve replacement:  - 10 years ago, bioprosthetic valve.   - Chart review shows a TTE done in 8/19 that shows an EF of 60-70% with moderate stenosis of bioprosthetic aortic valve with mean gradient 36 mmHg.     Hypertension:  - home Amlodepine 10mg  QD on hold due to normotension  - home Losartan 50 mg QD on hold due to normotension    Dyslipidemias:  - h/o intolerance to statins  - on Fish oil at home    BPH:  - Flomax 0.4mg  oral qd    Hypothyroidism;   - Synthroid 127mcg Oral Qd      DVT  PPx: Moderate risk for DVT but anticoagulation contraindicated at this time    DISCHARGE & DISPOSITION PLANNING:     Anticipated discharge needs:  TBD  Anticipated discharge location: TBD    Creola Corn, MD      I saw and examined the patient.  I reviewed the resident's note.  I agree with the findings and plan of care as documented in the resident's note.  Any exceptions/additions are edited/noted.    Julious Payer, MD

## 2017-12-01 ENCOUNTER — Ambulatory Visit (HOSPITAL_COMMUNITY): Payer: Self-pay

## 2017-12-01 LAB — BPAM PACKED CELL ORDER
UNIT DIVISION: 0
UNIT DIVISION: 0
UNIT DIVISION: 0
UNIT DIVISION: 0

## 2017-12-01 LAB — BASIC METABOLIC PANEL
ANION GAP: 6 mmol/L (ref 4–13)
ANION GAP: 6 mmol/L (ref 4–13)
BUN/CREA RATIO: 17 (ref 6–22)
BUN: 20 mg/dL (ref 8–25)
CALCIUM: 8.3 mg/dL — ABNORMAL LOW (ref 8.5–10.2)
CHLORIDE: 107 mmol/L (ref 96–111)
CO2 TOTAL: 26 mmol/L (ref 22–32)
CREATININE: 1.2 mg/dL (ref 0.62–1.27)
ESTIMATED GFR: >60 mL/min/{1.73_m2} (ref >60)
GLUCOSE: 92 mg/dL (ref 65–139)
POTASSIUM: 4.6 mmol/L (ref 3.5–5.1)
SODIUM: 139 mmol/L (ref 136–145)

## 2017-12-01 LAB — CBC WITH DIFF
BASOPHIL #: <0.1 10*3/uL (ref <=0.20)
BASOPHIL %: 1 %
EOSINOPHIL #: 0.26 10*3/uL (ref <=0.50)
EOSINOPHIL %: 3 %
HCT: 24.4 % — ABNORMAL LOW (ref 38.9–52.0)
HGB: 7.7 g/dL — ABNORMAL LOW (ref 13.4–17.5)
IMMATURE GRANULOCYTE #: <0.1 10*3/uL (ref <0.10)
IMMATURE GRANULOCYTE %: 1 % (ref 0–1)
LYMPHOCYTE #: 1.09 10*3/uL (ref 1.00–4.80)
LYMPHOCYTE %: 13 %
MCH: 29.5 pg (ref 26.0–32.0)
MCHC: 31.6 g/dL (ref 31.0–35.5)
MCV: 93.5 fL (ref 78.0–100.0)
MONOCYTE #: 0.99 10*3/uL (ref 0.20–1.10)
MONOCYTE %: 12 %
MPV: 11.1 fL (ref 8.7–12.5)
NEUTROPHIL #: 5.74 10*3/uL (ref 1.50–7.70)
NEUTROPHIL %: 70 %
PLATELETS: 126 10*3/uL — ABNORMAL LOW (ref 150–400)
RBC: 2.61 10*6/uL — ABNORMAL LOW (ref 4.50–6.10)
RDW-CV: 15.6 % — ABNORMAL HIGH (ref 11.5–15.5)
WBC: 8.2 10*3/uL (ref 3.7–11.0)

## 2017-12-01 LAB — TYPE AND CROSS RED CELLS - UNITS
ABO/RH(D): O POS
ANTIBODY SCREEN: NEGATIVE
UNITS ORDERED: 4

## 2017-12-01 LAB — MAGNESIUM: MAGNESIUM: 1.7 mg/dL (ref 1.6–2.6)

## 2017-12-01 LAB — PHOSPHORUS: PHOSPHORUS: 4.1 mg/dL (ref 2.4–4.7)

## 2017-12-01 MED ORDER — PSYLLIUM ORAL PACKET - CUSTOM: 1 | Freq: Two times a day (BID) | 0 refills | 0 days | Status: AC

## 2017-12-01 MED ADMIN — sodium chloride 0.9 % intravenous solution: ORAL | @ 09:00:00 | NDC 00338004904

## 2017-12-01 NOTE — Transitional Care (Addendum)
[facility] Medicine   Transitional Care Coordination         Name: Richard [name]   Age: 57 y.o.  Date of Birth: [birth year]   Date of service: 12/01/2017  Date of Admission:  11/23/2017     Called Dr. Bennie Pierini office [phone]) to refer patient. Left message requesting return call.    1155: Left message requesting return call.     1356: Left message requesting return call.    [address], [place], 12/01/2017  Ext. [phone]

## 2017-12-01 NOTE — Progress Notes (Signed)
[facility]  Internal Medicine  DAILY PROGRESS NOTE     Richard [name], 57 y.o., male MRN: [id number]   DOB: [birth year] Date of Service: 12/01/2017   PCP: Gavin Potters, APRN Code Status: Full Code discussed with patient     SUBJECTIVE:   Mr Ran was lying comfortably in bed this morning. He reports of no blood in stool overnight. He says that he is feeling better than before, his appetite has improved and he is eating fine too.     Denies chest pain, SOB, lightheadedness, diarrhea, n/v.   PHYSICAL EXAM:     Temperature: 36.8 C (98.2 F) Heart Rate: 88 BP (Non-Invasive): 128/72   Respiratory Rate: 18 SpO2: 92 % Weight: 95.1 kg (209 lb 10.5 oz)       Intake/Output Summary (Last 24 hours) at 12/01/2017 1152  Last data filed at 12/01/2017 0867  Gross per 24 hour   Intake 1200 ml   Output 400 ml   Net 800 ml     Last Bowel Movement: 11/30/17    General:  Appears acutely ill  HEENT: Mucous membranes moist  Eyes: PERRL, Conjunctiva non-icteric  Neck: Soft, supple  CV: Regular rate and rhythm no murmurs  Pulm: Clear to auscultation bilaterally no wheezing  Abd: Soft, Nontender nondistended with normal bowel sounds  Extrem: No cyanosis or edema  Skin: Cold, Pale and Dry  Neuro:  Patient appears in good mood, alert and oriented x 3     DIAGNOSTIC STUDIES:     I have personally reviewed labs & imaging.   CBC Differential   Recent Labs     11/29/17  0528  11/30/17  0417 11/30/17  0649 11/30/17  1448 12/01/17  0442   WBC 7.8  --  7.8  --   --  8.2   HGB 8.4*   < > 7.4* 6.8* 7.7* 7.7*   HCT 26.3*   < > 22.6* 21.0* 24.5* 24.4*   PLTCNT 100*  --  89*  --   --  126*    < > = values in this interval not displayed.    Recent Labs     11/29/17  0528 11/30/17  0417 12/01/17  0442   PMNS 77 75 70   MONOCYTES 9 12 12    BASOPHILS 0  <0.10 0  <0.10 1  <0.10   PMNABS 6.04 5.77 5.74   LYMPHSABS 0.77* 0.79* 1.09   MONOSABS 0.68 0.96 0.99   EOSABS 0.20 0.15 0.26      BMP LFTs   Recent Labs     12/01/17  0442   SODIUM 139      POTASSIUM 4.6   CHLORIDE 107   CO2 26   BUN 20   CREATININE 1.20   GLUCOSENF 92   ANIONGAP 6   BUNCRRATIO 17   GFR >60   CALCIUM 8.3*   MAGNESIUM 1.7   PHOSPHORUS 4.1    No results found for this encounter   CoAgs Blood Gas:   No results found for this encounter No results found for this encounter    Cardiac Markers Lipid Panel   No results for input(s): TROPONINI, CKMB, MBINDEX, BNP in the last 72 hours. No results found for this encounter   Urine Analysis Other Labs   No results found for this encounter No results found for this encounter    Invalid input(s): PRL      EKG: personally reviewed by me and showed normal sinus rhythm with  QTc of 432. (11/23/2017)    CURRENT INPATIENT MEDICATION LIST:       Current Facility-Administered Medications:  amitriptyline (ELAVIL) tablet 10 mg Oral NIGHTLY   calcium citrate + vitamin D (CITRACAL) 200mg  (elemental Ca)-200 unit tab 1 Tab Oral Daily   levETIRAcetam (KEPPRA) tablet 500 mg Oral 2x/day   levothyroxine (SYNTHROID) tablet 100 mcg Oral QAM   [Held by provider] losartan (COZAAR) tablet 50 mg Oral Daily   melatonin tablet 3 mg Oral HS PRN   multivitamin tablet 1 Tab Oral Daily   NS flush syringe 2 mL Intracatheter Q8HRS   And      NS flush syringe 2-6 mL Intracatheter Q1 MIN PRN   NS flush syringe 10-30 mL Intracatheter Q8HRS   NS flush syringe 20-30 mL Intracatheter Q1 MIN PRN   pantoprazole (PROTONIX) delayed release tablet 40 mg Oral 2x/day   psyllium (METAMUCIL) oral powder 1 Packet Oral 2x/day   tamsulosin (FLOMAX) capsule 0.4 mg Oral Daily after Dinner       ASSESSMENT:     Mr. Goldie remains hospitalized for painless per rectal bleeding 2/2 diverticulosis with clotted blood in diverticula. PMH is significant for bowel obstruction s/p resection, aortic valve replacement, seizures, hypothyroidsm and BPH.    - Level of care at this time is stepdown.    Active Hospital Problems    Diagnosis Date Noted   . Principle Problem: Acute GI bleeding [K92.2] 11/23/2017       Resolved Hospital Problems   No resolved problems to display.     PLAN:     Anemia of acute blood loss/Diverticular Bleeding:  Colonoscopy showed diverticulosis with clotted blood in diverticula.    On high fiber diet.  No blood in stools in last 24 hr.  Hb has improved to 7.7   Trending H&H q12h    Seizures:  - Past h/o seizures since 2000  - on Keppra 500mg  BID at 9 am and 10 pm.     Aortic valve replacement:  - 10 years ago, bioprosthetic valve.   - Chart review shows a TTE done in 8/19 that shows an EF of 60-70% with moderate stenosis of bioprosthetic aortic valve with mean gradient 36 mmHg.     Hypertension:  - home Amlodepine 10mg  QD on hold due to normotension  - home Losartan 50 mg QD on hold due to normotension    Dyslipidemias:  - h/o intolerance to statins  - on Fish oil at home    BPH:  - Flomax 0.4mg  oral qd    Hypothyroidism;   - Synthroid 164mcg Oral Qd      DVT PPx: Moderate risk for DVT but anticoagulation contraindicated at this time    DISCHARGE & DISPOSITION PLANNING:     Anticipated discharge needs: TBD  Anticipated discharge location: TBD    Creola Corn, MD    D/C home  Greater than 30 minutes was spent in the discharge process including patient education, med reconciliation, and transitions of care.    I saw and examined the patient.  I reviewed the resident's note.  I agree with the findings and plan of care as documented in the resident's note.  Any exceptions/additions are edited/noted.    Julious Payer, MD

## 2017-12-01 NOTE — Transitional Care (Signed)
[facility] Medicine   Transitional Care Coordination         Name: Richard [name]   Age: 57 y.o.  Date of Birth: [birth year]   Date of service: 12/01/2017  Date of Admission:  11/23/2017     Met with patient at bedside. Patient wants to keep current PCP but establish with GI in [place] or [place].    [address] [place], 12/01/2017  Ext. [phone]

## 2017-12-01 NOTE — Transitional Care (Signed)
Received return call from Dr. Bennie Pierini office. Scheduled patient on 12/04/17 at 1:45 PM. Office requests discharge summary be faxed to [phone].    Left message for patient with appointment info (physician name, office phone number, appointment date and time). Provided my contact information as well.    Faxed discharge summary via [employer].    [address] [place], 12/01/2017  Ext. [phone]

## 2017-12-01 NOTE — Transitional Care (Signed)
[facility] Medicine   Transition of Care Coordination        Name: MARKIS LANGLAND  Date of Birth: 1960/03/17 male  LOS: 8  Date/Time of Admission: 11/23/2017  4:21 PM   Service: MEDICINE 2      Called Gavin Potters, APRN's office to schedule hospital discharge follow up appointment. Scheduled on 12/07/17 at 2:00 PM. Appointment added to [place], [place], 12/01/2017  Ext. [phone]

## 2017-12-01 NOTE — Nurses Notes (Signed)
Results for Richard [name], Richard [name] (MRN [id number]) as of 12/01/2017 05:38   Ref. Range 12/01/2017 04:42   HGB Latest Ref Range: 13.4 - 17.5 g/dL 7.7 (L)       Service paged.

## 2017-12-01 NOTE — Care Plan (Signed)
Plan of care reviewed. Pt AOx4 and cooperative with care this shift.  Assessment per doc flow. Pt free from falls this shift. Needs met with hourly rounding. Pt resting at this time with call bell in reach. Hgb 7.7 this morning. Wheels locked and bed in lowest position. Will continue to monitor.     Richard Stabile, RN  12/01/2017, 06:26     Problem: Adult Inpatient Plan of Care  Goal: Plan of Care Review  Outcome: Ongoing (see interventions/notes)  Goal: Patient-Specific Goal (Individualization)  Outcome: Ongoing (see interventions/notes)  Flowsheets (Taken 12/01/2017 0625)  Patient-Specific Goals (Include Timeframe): Patient states "to have hgb be WNL"  Goal: Absence of Hospital-Acquired Illness or Injury  Outcome: Ongoing (see interventions/notes)  Goal: Optimal Comfort and Wellbeing  Outcome: Ongoing (see interventions/notes)  Goal: Rounds/Family Conference  Outcome: Ongoing (see interventions/notes)     Problem: Fall Injury Risk  Goal: Absence of Fall and Fall-Related Injury  Outcome: Ongoing (see interventions/notes)     Problem: Adjustment to Illness (Gastrointestinal Bleeding)  Goal: Optimal Coping with Acute Illness  Outcome: Ongoing (see interventions/notes)     Problem: Bleeding (Gastrointestinal Bleeding)  Goal: Hemostasis  Outcome: Ongoing (see interventions/notes)

## 2017-12-01 NOTE — Discharge Summary (Signed)
[facility]  DISCHARGE SUMMARY    PATIENT NAME:  Richard, [name]  MRN:  [id number]  DOB:  [birth year]    ENCOUNTER DATE:  11/23/2017  INPATIENT ADMISSION DATE: 11/23/2017  DISCHARGE DATE:  12/01/2017    ATTENDING PHYSICIAN: Julious Payer, MD  SERVICE: MEDICINE 2  PRIMARY CARE PHYSICIAN: Gavin Potters, APRN     Has PCP been verified with patient and updated? Yes    LAY CAREGIVER:  ,  ,        PRIMARY DISCHARGE DIAGNOSIS: Acute GI bleeding  Active Hospital Problems    Diagnosis Date Noted   . Principle Problem: Acute GI bleeding [K92.2] 11/23/2017      Resolved Hospital Problems   No resolved problems to display.     Active Non-Hospital Problems    Diagnosis Date Noted   . History of stress test 11/10/2017   . H/O echocardiogram 11/10/2017   . S/P AVR 08/07/2017   . Essential hypertension 08/07/2017   . Hyperlipidemia 06/05/2017   . Epilepsy (CMS [place]) 06/05/2017   . 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 Hypothyroidism 06/05/2017        DISCHARGE MEDICATIONS:     Current Discharge Medication List      START taking these medications.      Details   psyllium Packet  Commonly known as:  METAMUCIL   1 Packet, Oral, 2 TIMES DAILY  Refills:  0        CONTINUE these medications - NO CHANGES were made during your visit.      Details   amitriptyline 10 mg Tablet  Commonly known as:  ELAVIL   10 mg, Oral, NIGHTLY  Refills:  0     calcium citrate-vitamin D3 200 mg calcium -250 unit Tablet  Commonly known as:  CITRACAL   Oral, DAILY  Refills:  0     docusate sodium 100 mg Capsule  Commonly known as:  COLACE   100 mg, Oral, 2 TIMES DAILY  Refills:  0     FISH OIL-VIT D3 300-1,000-1,000 mg-mg-unit Capsule  Generic drug:  OM-3-DHA-EPA-Fish Oil-Vit D3   3 Caps, Oral, DAILY, Dosage 2000, mg fish oil/1000 iu Vitamin d3  Refills:  0     fluticasone propionate 50 mcg/actuation Spray, Suspension  Commonly known as:  FLONASE   2 Sprays, Each Nostril, DAILY  Refills:  0     levETIRAcetam 250 mg  Tablet  Commonly known as:  KEPPRA   500 mg, Oral, 2 TIMES DAILY  Refills:  0     levothyroxine 100 mcg Tablet  Commonly known as:  SYNTHROID   100 mcg, Oral, EVERY MORNING  Refills:  0     melatonin 5 mg Tablet   10 mg, Oral, NIGHTLY  Refills:  0     multivitamin Tablet   1 Tab, Oral, DAILY  Refills:  0     pantoprazole 40 mg Tablet, Delayed Release (E.C.)  Commonly known as:  PROTONIX   40 mg, Oral, DAILY  Refills:  0     polyethylene glycol 17 gram/dose Powder  Commonly known as:  MIRALAX   17 g, Oral, DAILY  Refills:  0     tamsulosin 0.4 mg Capsule  Commonly known as:  FLOMAX   0.4 mg, Oral, EVERY EVENING AFTER DINNER  Refills:  0     VITAMIN D2 ORAL   1 Tab, Oral, DAILY  Refills:  0  STOP taking these medications.    amLODIPine 10 mg Tablet  Commonly known as:  NORVASC     cyclobenzaprine 10 mg Tablet  Commonly known as:  FLEXERIL     losartan 50 mg Tablet  Commonly known as:  COZAAR          Discharge med list refreshed?  YES        During this hospitalization did the patient have an AMI, PCI/PCTA, STENT or Isolated CABG?  No                    ALLERGIES:  Allergies   Allergen Reactions   . Statins-Hmg-Coa Reductase Inhibitors Nausea/ Vomiting             HOSPITAL PROCEDURE(S):   Bedside Procedures:  Orders Placed This Encounter   Procedures   . COLONOSCOPY     Surgical Procedure(s):  COLONOSCOPY    REASON FOR HOSPITALIZATION AND HOSPITAL COURSE     BRIEF HPI:  This is a 57 y.o., male who presented to [facility] ED with large volume bright red blood in stool. He reported that he was having N/V and dizziness when the bleeding began and actually had one episode of syncope. He had GI bleed before in 2017 after partial colectomy; denied having any problems with bleeding ever since; not on any home blood thinners. After the syncopal episode he reported to the ED as he still felt like he was bleeding.  Patients wife noted that he had seemed altered after the initial episode.    BRIEF HOSPITAL NARRATIVE:    Anemia of  acute blood loss/Diverticular Bleeding. Patient did require PRBCs transfusions. At the time of discharge his Hg was 7.7 and had been stable for the last 48 hours. GI was consulted during his hospital stay and performed colonoscopy. They recommended monitoring the patient until bleeding resolved.   Colonoscopy (11/24/2017) showed diverticulosis with clotted blood in the sigmoid colon.    On high fiber diet.  No blood in stools in last 24 hr.  Hb level has improved.   Seizures:  - Past h/o seizures since 2000  - on Keppra 500mg  BID.   Aortic valve replacement:  - 10 years ago, bioprosthetic valve.   - Chart review shows a TTE done in 8/19 that shows an EF of 60-70% with moderate stenosis of bioprosthetic aortic valve with mean gradient 36 mmHg.   Dyslipidemias:  - h/o intolerance to statins  - on Fish oil at home  BPH:  - Flomax 0.4mg  oral qd  Hypothyroidism;   - Synthroid 150mcg Oral Qd    TRANSITION/POST DISCHARGE CARE/PENDING TESTS/REFERRALS:   - Patient was advised to follow-up with his PCP appointment on 12/07/2017.  - Our TCC is currently working with Dr. Bennie Pierini office to schedule a GI follow-up in a week.  - Home Losartan and Amlodipine were held during this admission due to hypotension, can restart on PCP advise if BP runs high.       CONDITION ON DISCHARGE:  A. Ambulation: Full ambulation  B. Self-care Ability: Complete  C. Cognitive Status Alert and Oriented x 3  D. Code status at discharge:   Code Status Information     Code Status    Full Code                 LINES/DRAINS/WOUNDS AT DISCHARGE:   Patient Lines/Drains/Airways Status    Active Line / Dialysis Catheter / Dialysis Graft / Drain / Airway / Wound  Name: Placement date: Placement time: Site: Days:    Midline Double Lumen Lumen 1 Purple Lumen 2 Red Left;Basilic Vein NOT A CENTRAL LINE  11/28/17   1900   4 FR  2                DISCHARGE DISPOSITION:  Home discharge and Home Health              DISCHARGE INSTRUCTIONS:       Refer to [place]    The reason for you bleeding was colonic diverticulosis. It is of the utmost importance that you have a high fiber diet and continue to supplement with Metamucil. Your appointment with the gastrointestinal doctors in [place] will be arranged by our TCC coordinator. Please call back to inquire about this on Tuesday (12/05/2017) if you have not heard from [place]. It is important that you are seen by the gastrointestinal doctors in 1-2 weeks (preferably 1).                Creola Corn, MD      Copies sent to Care Team       Relationship Specialty Notifications Start End    Gavin Potters, APRN PCP - General NURSE PRACTITIONER  06/05/17     Verified by transitions team 11/27/17 --CMM    Phone: [phone] Fax: [phone]         [place] [id number]          Referring providers can utilize [email or web address] to access their referred [place] patient's information.

## 2017-12-01 NOTE — Care Management Notes (Signed)
[place] Management Note    Patient Name: Richard [name]  Date of Birth: Feb 22, 1961  Sex: male  Date/Time of Admission: 11/23/2017  4:21 PM  Room/Bed: 710/A  Payor: [employer] / Plan: [place] MEDICARE ADVANTAGE PPO / Product Type: PPO /    LOS: 8 days   Primary Care Providers:  Gavin Potters, APRN, APRN (General)    Admitting Diagnosis:  Acute GI bleeding [K92.2]    Assessment:      12/01/17 1118   Discharge Information   Discharge Disposition home [place]   Discharge Date 12/01/17   Transport Type   Transport Mode Private Vehicle         Discharge Plan:  Home with Home Health (code 6)   Patient for discharge today with [facility].  [place] [facility] talked with [facility] just needs the discharge summary to be faxed and they are good.  Number placed in AVS for report.  D/C IMM and [employer] signed.       The patient will continue to be evaluated for developing discharge needs.     Case Manager: Baxter Kail, [place] COORDINATOR  Phone: [phone]

## 2017-12-04 ENCOUNTER — Telehealth (INDEPENDENT_AMBULATORY_CARE_PROVIDER_SITE_OTHER): Payer: Self-pay | Admitting: Surgical

## 2017-12-04 DIAGNOSIS — R059 Cough, unspecified: Secondary | ICD-10-CM

## 2017-12-04 DIAGNOSIS — R05 Cough: Principal | ICD-10-CM

## 2017-12-04 MED ORDER — AZITHROMYCIN 250 MG TABLET
ORAL_TABLET | ORAL | 0 refills | Status: DC
Start: 2017-12-04 — End: 2017-12-13

## 2017-12-04 NOTE — Telephone Encounter (Signed)
Attempted to reach patient. No answer. Left message to contact office back so that I may speak with him. Lyndel Safe, PA-C  12/04/2017, 11:25

## 2017-12-04 NOTE — Care Management Notes (Signed)
Referral Information  ++++++ Placed Provider #1 ++++++  Case Manager: Lori Taylor  Provider Type: Home Health  Provider Name: [facility]  Address:  [address]  [place], [place] [address]  Contact:    Fax:   Fax:

## 2017-12-04 NOTE — Telephone Encounter (Signed)
Wife returned my call: patient has productive cough since hospital discharge. Rx for Azithromycin. Order placed for CXR. Available to come for appointment tomorrow at 10:30am. Lyndel Safe, PA-C  12/04/2017, 11:31

## 2017-12-04 NOTE — Telephone Encounter (Signed)
Pt's wife called. He was in the hospital last week at [facility] and since his release he has had a bad cough. She is afraid it is going into pneumonia. She would like for him to be seen today. He was advised by the hospital to follow up here and is scheduled for 12/07/2017 but she is afraid to wait that long.  Eleonore Chiquito, LPN  06/07/7541, 60:67

## 2017-12-05 ENCOUNTER — Encounter (INDEPENDENT_AMBULATORY_CARE_PROVIDER_SITE_OTHER): Payer: Self-pay | Admitting: Surgical

## 2017-12-05 ENCOUNTER — Ambulatory Visit (INDEPENDENT_AMBULATORY_CARE_PROVIDER_SITE_OTHER): Payer: Self-pay | Admitting: Surgical

## 2017-12-05 ENCOUNTER — Ambulatory Visit (INDEPENDENT_AMBULATORY_CARE_PROVIDER_SITE_OTHER): Payer: Medicare PPO | Admitting: Surgical

## 2017-12-05 ENCOUNTER — Ambulatory Visit
Admission: RE | Admit: 2017-12-05 | Discharge: 2017-12-05 | Disposition: A | Discharge status: Home / Self Care | Payer: Medicare PPO | Source: Ambulatory Visit | Attending: Surgical | Admitting: Surgical

## 2017-12-05 ENCOUNTER — Ambulatory Visit (INDEPENDENT_AMBULATORY_CARE_PROVIDER_SITE_OTHER): Payer: Medicare PPO

## 2017-12-05 VITALS — BP 106/66 | HR 88 | Temp 97.8°F | Resp 16 | Ht 69.0 in | Wt 197.0 lb

## 2017-12-05 DIAGNOSIS — R059 Cough, unspecified: Secondary | ICD-10-CM

## 2017-12-05 DIAGNOSIS — K922 Gastrointestinal hemorrhage, unspecified: Secondary | ICD-10-CM | POA: Insufficient documentation

## 2017-12-05 DIAGNOSIS — Z6829 Body mass index (BMI) 29.0-29.9, adult: Secondary | ICD-10-CM | POA: Insufficient documentation

## 2017-12-05 DIAGNOSIS — M542 Cervicalgia: Secondary | ICD-10-CM

## 2017-12-05 DIAGNOSIS — R05 Cough: Secondary | ICD-10-CM | POA: Insufficient documentation

## 2017-12-05 LAB — BASIC METABOLIC PANEL
ANION GAP: 11 mmol/L (ref 5–15)
BUN/CREA RATIO: 12 (ref 6–20)
BUN: 17 mg/dL (ref 8–26)
CALCIUM: 9.2 mg/dL (ref 8.9–10.3)
CHLORIDE: 106 mmol/L (ref 101–111)
CO2 TOTAL: 22 mmol/L (ref 22–32)
CREATININE: 1.45 mg/dL — ABNORMAL HIGH (ref 0.90–1.30)
ESTIMATED GFR: 50 mL/min/{1.73_m2} — ABNORMAL LOW (ref >60)
GLUCOSE: 118 mg/dL — ABNORMAL HIGH (ref 70–110)
POTASSIUM: 4.9 mmol/L (ref 3.6–5.1)
SODIUM: 139 mmol/L (ref 136–144)

## 2017-12-05 LAB — CBC
HCT: 31 % — ABNORMAL LOW (ref 38.9–52.0)
HGB: 9.5 g/dL — ABNORMAL LOW (ref 13.4–17.5)
MCH: 29.3 pg (ref 26.0–32.0)
MCHC: 30.6 g/dL — ABNORMAL LOW (ref 31.0–35.5)
MCV: 95.7 fL (ref 78.0–100.0)
MPV: 11.3 fL (ref 8.7–12.5)
PLATELETS: 243 10*3/uL (ref 150–400)
RBC: 3.24 10*6/uL — ABNORMAL LOW (ref 4.50–6.10)
RDW-CV: 14.8 % (ref 11.5–15.5)
WBC: 11.1 10*3/uL — ABNORMAL HIGH (ref 3.7–11.0)

## 2017-12-05 NOTE — Telephone Encounter (Signed)
I spoke with the pt's wife. She said he would like a referral to PT. [facility] will be okay.  Eleonore Chiquito, LPN  73/06/2874, 81:15

## 2017-12-05 NOTE — Telephone Encounter (Signed)
-----   Message from [place] sent at 12/05/2017  1:53 PM EDT -----  Please call

## 2017-12-05 NOTE — Progress Notes (Signed)
Richard [name], Richard [name]  [place] [address]     Chief Complaint    Hospital Follow Up        History of Present Illness    This patient is a 57 y.o. male who is being seen in the office today for hospital follow up. Patient was admitted to [facility] on 11/23/2017 and discharged home on 12/01/2017 for acute GI bleed from diverticuli. Patient explains that the week prior he ate lots of popcorn which has caused this problem in the past. Colonoscopy was performed while in hospital. He is awaiting GI appointment (patient requested follow up with Dr. Aris Georgia in [place]). He states that since discharge he has felt weak, hot/cold episodes, but denies fever, nausea, vomiting, abdominal pain, melena or hematochezia. Patient states that bowel movements are light brown, usually twice daily. He denies any trouble with urination. He has noticed a productive cough since discharge. His wife called into office yesterday for this. Patient was started on Azithromycin and CXR performed prior to clinic arrival. Patient notes that cough is better since starting antibiotics yesterday. He mentions that he is currently holding both blood pressure medications (Losartan and Amlodipine) since discharge due to low blood pressure while in hospital. Patient also mentions his chronic neck pain, but states that PT is arranged. Patient is aware that if PT does not help symptoms, we can always refer to specialists.           Social/family history reviewed with patient.         Allergies    Allergies   Allergen Reactions   . Statins-Hmg-Coa Reductase Inhibitors Nausea/ Vomiting       Patient History    Past Medical History has been reviewed, confirmed, and as follows below.  History obtained from Patient    Past Medical History:   Diagnosis Date   . Anxiety    . BPH (benign prostatic hyperplasia)    . Congenital anomaly of kidney     HORSESHOE KIDNEY   . Depression     . Diarrhea     CDIFF   . Epilepsy (CMS [place])     OVER 10 YEARS SINCE LAST SEIZURE-DR WIEMER   . Esophageal reflux    . GIB (gastrointestinal bleeding) 2017   . High blood pressure    . Hx of aortic valve replacement    . Hypercholesterolemia    . Hyperlipidemia    . Hypothyroidism    . Mass of esophagus    . Nodular prostate    . Palpitations    . Pneumonia    . Testicular hypofunction      Past Surgical History:   Procedure Laterality Date   . COLONOSCOPY     . COLONOSCOPY N/A 11/24/2017    Performed by Vella Raring, MD at [facility]   . HX AORTIC VALVE REPLACEMENT  2009   . HX COLECTOMY  2016    FOR OBSTRUCTION   . HX TURP     . HX UPPER ENDOSCOPY  04/05/2016   . INCISIONAL HERNIA REPAIR     . VENTRAL HERNIA REPAIR       Family Medical History:     Problem Relation (Age of Onset)    Blood Clots Paternal Grandmother    Coronary Artery Disease Mother, Father    Epilepsy Sister    Heart Attack Father    High Cholesterol Mother, Father  Hypertension (High Blood Pressure) Sister, Sister    Stroke Mother    Thyroid Disease Mother            Social History     Socioeconomic History   . Marital status: Married     Spouse name: Not on file   . Number of children: Not on file   . Years of education: Not on file   . Highest education level: Not on file   Occupational History   . Occupation: disabled    Tobacco Use   . Smoking status: Never Smoker   . Smokeless tobacco: Never Used   Substance and Sexual Activity   . Alcohol use: Yes     Alcohol/week: 1.0 standard drinks     Types: 1 Glasses of wine per week     Binge frequency: Less than monthly   . Drug use: Never   . Sexual activity: Yes       Current Outpatient Medications:   .  amitriptyline (ELAVIL) 10 mg Oral Tablet, Take 10 mg by mouth Every night, Disp: , Rfl:   .  azithromycin (ZITHROMAX) 250 mg Oral Tablet, Take 500 mg (2 tab) on day 1; take 250 mg (1 tab) on days 2-5., Disp: 6 Tab, Rfl: 0  .  calcium citrate-vitamin D3 (CITRACAL) 200 mg calcium -250 unit Oral  Tablet, Take by mouth Once a day, Disp: , Rfl:   .  docusate sodium (COLACE) 100 mg Oral Capsule, Take 100 mg by mouth Twice daily, Disp: , Rfl:   .  ergocalciferol, vitamin D2, (VITAMIN D2 ORAL), Take 1 Tab by mouth Once a day, Disp: , Rfl:   .  fluticasone propionate (FLONASE) 50 mcg/actuation Nasal Spray, Suspension, 2 Sprays by Each Nostril route Once a day, Disp: , Rfl:   .  levETIRAcetam (KEPPRA) 250 mg Oral Tablet, Take 500 mg by mouth Twice daily , Disp: , Rfl:   .  levothyroxine (SYNTHROID) 100 mcg Oral Tablet, Take 100 mcg by mouth Every morning, Disp: , Rfl:   .  melatonin 5 mg Oral Tablet, Take 10 mg by mouth Every night , Disp: , Rfl:   .  multivitamin Oral Tablet, Take 1 Tab by mouth Once a day, Disp: , Rfl:   .  OM-3-DHA-EPA-Fish Oil-Vit D3 (FISH OIL-VIT D3) 300-1,000-1,000 mg-mg-unit Oral Capsule, Take 3 Caps by mouth Once a day Dosage 2000, mg fish oil/1000 iu Vitamin d3 , Disp: , Rfl:   .  pantoprazole (PROTONIX) 40 mg Oral Tablet, Delayed Release (E.C.), Take 40 mg by mouth Once a day , Disp: , Rfl:   .  polyethylene glycol (MIRALAX) 17 gram/dose Oral Powder, Take 17 g by mouth Once a day, Disp: , Rfl:   .  psyllium (METAMUCIL) Packet, Take 1 Packet by mouth Twice daily, Disp: , Rfl:   .  tamsulosin (FLOMAX) 0.4 mg Oral Capsule, Take 0.4 mg by mouth Every evening after dinner, Disp: , Rfl:       Review of Systems    Review of Systems was obtained and is unremarkable except as stated in HPI.    Vital Signs    Most Recent Vitals      Office Visit from 12/05/2017 in Internal Medicine, [place]   Temperature  36.6 C (97.8 F) filed at... 12/05/2017 0944   Heart Rate  88 filed at... 12/05/2017 0944   Respiratory Rate  16 filed at... 12/05/2017 0944   BP (Non-Invasive)  106/66 filed at.[facility]  12/05/2017 0944   SpO2  --   Height  1.753 m (5\' 9" ) filed at... 12/05/2017 0944   Weight  89.4 kg (197 lb) filed at... 12/05/2017 0944   BMI (Calculated)  29.15 filed at... 12/05/2017 0944   BSA  (Calculated)  2.09 filed at... 12/05/2017 0944            Physical Exam    Constitutional 57 y.o.  male, in no acute distress at time of exam.    EENT: Eyes PERRL, EOMI, no obvious sign of infection.  Ears, Nose, Throat: Ears Clear, TM's intact, Nose patent and without significant mucosal abnormality, Oral cavity clear, Pharynx benign.  Neck without mass or adenopathy, trachea midline.    Cardiovascular: regular heart rate and rhythm, + murmur, gallops or rubs.  No edema.   Respiratory Respirations even and unlabored. Lungs clear to auscultation, negative wheezes or rhonchi. No extension of expiratory phase.   Gastrointestinal: Soft, non-tender, no distension, no obvious mass, organomegaly or bruit.  Musculoskeletal: Active ROM of all extremities. Gait is stable.    Integumentary: No cyanosis noted.  Skin is pale.   Neurologic: Cranial nerves 2-12 intact. No focal deficits.   Psychiatric Alert and oriented x 4 at office visit today.  Affect appropriate to content.  No obvious Psychopathology.    Hematologic/Lymphatic: No Lymphadenopathy, no abnormal bruising.        Impressions/Plan    (K92.2) Gastrointestinal hemorrhage, unspecified gastrointestinal hemorrhage type  (primary encounter diagnosis)  Plan: Reviewed hospital records with patient. Repeat labs today- clinic will call patient with results. Advised patient that if he does not hear from GI clinic regarding an appointment, to contact office for assistance. He is aware of adverse symptoms to monitor for. In regards to CXR, results were discussed with patient in clinic. Advised that he continue antibiotics to completion. Will continue to hold blood pressure medications at this time. Patient will monitor blood pressure at home and notify me if any problems. I will see him back in 1 month for follow up or sooner if any problems.             Follow up    Return in about 4 weeks (around 01/02/2018).      The patient was given the opportunity to ask questions and  those questions were answered to the patient's satisfaction. The patient was encouraged to call with any additional questions or concerns. Instructed patient to call back if symptoms worsen. Patient and provider shared in the decision making process.     Discussed with patient effects and side effects of medications. Medication safety was discussed. A copy of the patient's medication list was printed and given to the patient. A good faith effort was made to reconcile the patient's medications.      Patient was counseled about lifestyle modification including increasing physical activity and proper diet including balanced diet and portion control. Patient also counseled about age-appropriate health maintenance issues including obtaining regular lab work, eye exams, dental exams, screening procedures/tests, vaccination, and appropriate vitamin supplementation. Also instructed to follow-up with specialists as scheduled    Patient was seen independently with co-signing physician available for consult.     Lyndel Safe, PA-C  12/05/2017, 11:00      I agree with above plan. Any exceptions are noted above.    Yolanda Bonine, DO

## 2017-12-05 NOTE — Telephone Encounter (Signed)
Order placed. Lyndel Safe, PA-C  12/05/2017, 16:06

## 2017-12-07 ENCOUNTER — Ambulatory Visit (INDEPENDENT_AMBULATORY_CARE_PROVIDER_SITE_OTHER): Payer: Self-pay | Admitting: Surgical

## 2017-12-11 ENCOUNTER — Ambulatory Visit (INDEPENDENT_AMBULATORY_CARE_PROVIDER_SITE_OTHER): Payer: Self-pay | Admitting: Surgical

## 2017-12-11 DIAGNOSIS — K922 Gastrointestinal hemorrhage, unspecified: Secondary | ICD-10-CM

## 2017-12-11 NOTE — Telephone Encounter (Signed)
-----   Message from Merwyn Katos sent at 12/11/2017  9:52 AM EDT -----  RETURNING YOUR CALL

## 2017-12-11 NOTE — Telephone Encounter (Signed)
Would like another CBC to be sure H\H is still coming up. Cameron Sprang, LPN  34/0/3524, 81:85

## 2017-12-12 ENCOUNTER — Other Ambulatory Visit (INDEPENDENT_AMBULATORY_CARE_PROVIDER_SITE_OTHER): Payer: Self-pay | Admitting: Surgical

## 2017-12-13 ENCOUNTER — Encounter (INDEPENDENT_AMBULATORY_CARE_PROVIDER_SITE_OTHER): Payer: Self-pay | Admitting: Family Medicine

## 2017-12-13 ENCOUNTER — Telehealth (INDEPENDENT_AMBULATORY_CARE_PROVIDER_SITE_OTHER): Payer: Self-pay | Admitting: Family Medicine

## 2017-12-13 ENCOUNTER — Ambulatory Visit: Payer: Medicare PPO | Attending: Family Medicine | Admitting: Family Medicine

## 2017-12-13 ENCOUNTER — Ambulatory Visit (INDEPENDENT_AMBULATORY_CARE_PROVIDER_SITE_OTHER): Payer: Medicare PPO

## 2017-12-13 ENCOUNTER — Other Ambulatory Visit (INDEPENDENT_AMBULATORY_CARE_PROVIDER_SITE_OTHER): Payer: Self-pay | Admitting: Surgical

## 2017-12-13 VITALS — BP 110/64 | HR 88 | Temp 98.7°F | Ht 69.0 in | Wt 195.6 lb

## 2017-12-13 DIAGNOSIS — Z6828 Body mass index (BMI) 28.0-28.9, adult: Secondary | ICD-10-CM | POA: Insufficient documentation

## 2017-12-13 DIAGNOSIS — I739 Peripheral vascular disease, unspecified: Secondary | ICD-10-CM

## 2017-12-13 DIAGNOSIS — K922 Gastrointestinal hemorrhage, unspecified: Secondary | ICD-10-CM

## 2017-12-13 LAB — CBC WITH DIFF
BASOPHIL #: <0.1 10*3/uL (ref <=0.20)
BASOPHIL %: 1 %
EOSINOPHIL #: 0.2 10*3/uL (ref <=0.50)
EOSINOPHIL %: 2 %
HCT: 30.1 % — ABNORMAL LOW (ref 38.9–52.0)
HGB: 9.1 g/dL — ABNORMAL LOW (ref 13.4–17.5)
IMMATURE GRANULOCYTE #: <0.1 10*3/uL (ref <0.10)
IMMATURE GRANULOCYTE %: 0 % (ref 0–1)
LYMPHOCYTE #: 0.8 10*3/uL — ABNORMAL LOW (ref 1.00–4.80)
LYMPHOCYTE %: 10 %
MCH: 27.8 pg (ref 26.0–32.0)
MCHC: 30.2 g/dL — ABNORMAL LOW (ref 31.0–35.5)
MCHC: 30.2 g/dL — ABNORMAL LOW (ref 31.0–35.5)
MCV: 92 fL (ref 78.0–100.0)
MONOCYTE #: 0.84 10*3/uL (ref 0.20–1.10)
MONOCYTE %: 10 %
MPV: 10.2 fL (ref 8.7–12.5)
NEUTROPHIL #: 6.28 10*3/uL (ref 1.50–7.70)
NEUTROPHIL %: 77 %
PLATELETS: 245 10*3/uL (ref 150–400)
RBC: 3.27 10*6/uL — ABNORMAL LOW (ref 4.50–6.10)
RDW-CV: 14.3 % (ref 11.5–15.5)
WBC: 8.2 10*3/uL (ref 3.7–11.0)

## 2017-12-13 MED ORDER — CILOSTAZOL 50 MG TABLET
50.00 mg | ORAL_TABLET | Freq: Two times a day (BID) | ORAL | 0 refills | Status: DC
Start: 2017-12-13 — End: 2017-12-14

## 2017-12-13 NOTE — Progress Notes (Signed)
Subjective:     Patient ID:  EWEL LONA is an 57 y.o. male     Chief Complaint:    Chief Complaint   Patient presents with   . Check Up       Patient is a pleasant 56 year old male who presents with bilateral leg pain.  He states he has a burning aching pain in his legs that is worse when he is walking.  This is been present for some time.  He states he goes from his thighs down to his feet.  Left is worse than right.        Past Medical History:   Diagnosis Date   . Anxiety    . BPH (benign prostatic hyperplasia)    . Congenital anomaly of kidney     HORSESHOE KIDNEY   . Depression    . Diarrhea     CDIFF   . Epilepsy (CMS [place])     OVER 10 YEARS SINCE LAST SEIZURE-DR WIEMER   . Esophageal reflux    . GIB (gastrointestinal bleeding) 2017   . High blood pressure    . Hx of aortic valve replacement    . Hypercholesterolemia    . Hyperlipidemia    . Hypothyroidism    . Mass of esophagus    . Nodular prostate    . Palpitations    . Pneumonia    . Testicular hypofunction      Current Outpatient Medications   Medication Sig   . amitriptyline (ELAVIL) 10 mg Oral Tablet Take 10 mg by mouth Every night   . calcium citrate-vitamin D3 (CITRACAL) 200 mg calcium -250 unit Oral Tablet Take by mouth Once a day   . cilostazol (PLETAL) 50 mg Oral Tablet Take 1 Tab (50 mg total) by mouth Twice daily   . docusate sodium (COLACE) 100 mg Oral Capsule Take 100 mg by mouth Twice daily   . ergocalciferol, vitamin D2, (VITAMIN D2 ORAL) Take 1 Tab by mouth Once a day   . fluticasone propionate (FLONASE) 50 mcg/actuation Nasal Spray, Suspension 2 Sprays by Each Nostril route Once a day   . levETIRAcetam (KEPPRA) 250 mg Oral Tablet Take 500 mg by mouth Twice daily    . levothyroxine (SYNTHROID) 100 mcg Oral Tablet Take 100 mcg by mouth Every morning   . melatonin 5 mg Oral Tablet Take 10 mg by mouth Every night    . multivitamin Oral Tablet Take 1 Tab by mouth Once a day   . OM-3-DHA-EPA-Fish Oil-Vit D3 (FISH OIL-VIT D3)  300-1,000-1,000 mg-mg-unit Oral Capsule Take 3 Caps by mouth Once a day Dosage 2000, mg fish oil/1000 iu Vitamin d3    . pantoprazole (PROTONIX) 40 mg Oral Tablet, Delayed Release (E.C.) Take 40 mg by mouth Once a day    . polyethylene glycol (MIRALAX) 17 gram/dose Oral Powder Take 17 g by mouth Once a day   . psyllium (METAMUCIL) Packet Take 1 Packet by mouth Twice daily   . tamsulosin (FLOMAX) 0.4 mg Oral Capsule Take 0.4 mg by mouth Every evening after dinner       Review of Systems   Constitutional: Negative.    HENT: Negative.    Eyes: Negative.    Respiratory: Negative.    Cardiovascular: Negative.    Gastrointestinal: Negative.    Endocrine: Negative.    Genitourinary: Negative.    Musculoskeletal: Positive for myalgias.   Skin: Negative.    Allergic/Immunologic: Negative.    Neurological: Negative.  Hematological: Negative.    Psychiatric/Behavioral: Negative.        Objective:     Physical Exam   Musculoskeletal: Normal range of motion. He exhibits no tenderness.   Skin: Skin is dry.   Skin cool, dorsalis pedis and posterior tibial pulses present bilaterally +2, no hair noted above mid shin.       Ortho Exam  Vitals:    12/13/17 1110   BP: 110/64   Pulse: 88   Temp: 37.1 C (98.7 F)   Weight: 88.7 kg (195 lb 9.6 oz)   Height: 1.753 m (5\' 9" )   BMI: 28.95       Assessment & Plan:       ICD-10-CM    1. Claudication (CMS HCC) I73.9 cilostazol (PLETAL) 50 mg Oral Tablet       -patient reports claudication bilaterally worse with walking.  On exam, pulses were present, but extremities were cold and there is no hair below mid shin.  Will try medication for 1 month, and if no improvement will refer to vascular.      The patient was given the opportunity to ask questions and those questions were answered to the patient's satisfaction. The patient was encouraged to call with any additional questions or concerns. Instructed patient to call back if symptoms worsen.     Discussed with patient effects and side  effects of medications. Medication safety was discussed. A copy of the patient's medication list was printed and given to the patient. A good faith effort was made to reconcile the patient's medications.     Patient was counseled about lifestyle modification including increasing physical activity and proper diet including balanced diet and portion control. Patient also counseled about age-appropriate health maintenance issues including obtaining regular lab work, eye exams, dental exams, screening procedures/tests, vaccination, and appropriate vitamin supplementation. Also instructed to follow-up with specialists as scheduled    Yolanda Bonine, DO

## 2017-12-13 NOTE — Telephone Encounter (Signed)
Pt's wife called because she was unable to come to the apt with her husband and has questions about the medication he was given. She said he was given pletal and she looked it up on the internet and it has some pretty serious side effects. She said he was recently in the hospital and was given 6 units of blood. They started using the boots on him to increase circulation but he was up and down so much it did not seam feasable to continue with taking them on and off all the time. She is concerned that he may have PAD or maybe a DVT and wonders if a sonogram or some other test might be in order. She would like a call back and can be reached at [phone].  Eleonore Chiquito, LPN  29/09/9890, 11:94

## 2017-12-14 ENCOUNTER — Other Ambulatory Visit (INDEPENDENT_AMBULATORY_CARE_PROVIDER_SITE_OTHER): Payer: Self-pay | Admitting: Family Medicine

## 2017-12-14 ENCOUNTER — Encounter (INDEPENDENT_AMBULATORY_CARE_PROVIDER_SITE_OTHER): Payer: Self-pay | Admitting: Family Medicine

## 2017-12-14 ENCOUNTER — Other Ambulatory Visit (INDEPENDENT_AMBULATORY_CARE_PROVIDER_SITE_OTHER): Payer: Self-pay | Admitting: Surgical

## 2017-12-14 ENCOUNTER — Emergency Department (HOSPITAL_COMMUNITY): Payer: Medicare PPO

## 2017-12-14 ENCOUNTER — Observation Stay
Admission: EM | Admit: 2017-12-14 | Discharge: 2017-12-15 | Disposition: A | Discharge status: Other Acute Inpatient Hospital | Payer: Medicare PPO | Attending: Internal Medicine | Admitting: Internal Medicine

## 2017-12-14 ENCOUNTER — Ambulatory Visit (HOSPITAL_COMMUNITY)
Admission: RE | Admit: 2017-12-14 | Discharge: 2017-12-14 | Disposition: A | Discharge status: Home / Self Care | Payer: Medicare PPO | Source: Ambulatory Visit | Attending: Surgical | Admitting: Surgical

## 2017-12-14 ENCOUNTER — Encounter (HOSPITAL_COMMUNITY): Payer: Self-pay

## 2017-12-14 ENCOUNTER — Inpatient Hospital Stay (HOSPITAL_COMMUNITY): Payer: Medicare PPO | Admitting: Internal Medicine

## 2017-12-14 DIAGNOSIS — Z79899 Other long term (current) drug therapy: Secondary | ICD-10-CM | POA: Insufficient documentation

## 2017-12-14 DIAGNOSIS — E7800 Pure hypercholesterolemia, unspecified: Secondary | ICD-10-CM | POA: Diagnosis present

## 2017-12-14 DIAGNOSIS — N400 Enlarged prostate without lower urinary tract symptoms: Secondary | ICD-10-CM | POA: Diagnosis present

## 2017-12-14 DIAGNOSIS — I10 Essential (primary) hypertension: Secondary | ICD-10-CM | POA: Diagnosis present

## 2017-12-14 DIAGNOSIS — K229 Disease of esophagus, unspecified: Secondary | ICD-10-CM | POA: Diagnosis present

## 2017-12-14 DIAGNOSIS — I82409 Acute embolism and thrombosis of unspecified deep veins of unspecified lower extremity: Secondary | ICD-10-CM | POA: Diagnosis present

## 2017-12-14 DIAGNOSIS — Z952 Presence of prosthetic heart valve: Secondary | ICD-10-CM | POA: Insufficient documentation

## 2017-12-14 DIAGNOSIS — Q631 Lobulated, fused and horseshoe kidney: Secondary | ICD-10-CM

## 2017-12-14 DIAGNOSIS — E785 Hyperlipidemia, unspecified: Secondary | ICD-10-CM | POA: Insufficient documentation

## 2017-12-14 DIAGNOSIS — K219 Gastro-esophageal reflux disease without esophagitis: Secondary | ICD-10-CM | POA: Diagnosis present

## 2017-12-14 DIAGNOSIS — M7989 Other specified soft tissue disorders: Principal | ICD-10-CM

## 2017-12-14 DIAGNOSIS — O2230 Deep phlebothrombosis in pregnancy, unspecified trimester: Secondary | ICD-10-CM

## 2017-12-14 DIAGNOSIS — Z832 Family history of diseases of the blood and blood-forming organs and certain disorders involving the immune mechanism: Secondary | ICD-10-CM | POA: Insufficient documentation

## 2017-12-14 DIAGNOSIS — K922 Gastrointestinal hemorrhage, unspecified: Secondary | ICD-10-CM | POA: Insufficient documentation

## 2017-12-14 DIAGNOSIS — D649 Anemia, unspecified: Secondary | ICD-10-CM

## 2017-12-14 DIAGNOSIS — I2699 Other pulmonary embolism without acute cor pulmonale: Secondary | ICD-10-CM | POA: Diagnosis present

## 2017-12-14 DIAGNOSIS — Z8489 Family history of other specified conditions: Secondary | ICD-10-CM | POA: Insufficient documentation

## 2017-12-14 DIAGNOSIS — Z823 Family history of stroke: Secondary | ICD-10-CM | POA: Insufficient documentation

## 2017-12-14 DIAGNOSIS — Z8349 Family history of other endocrine, nutritional and metabolic diseases: Secondary | ICD-10-CM | POA: Insufficient documentation

## 2017-12-14 DIAGNOSIS — Z79890 Hormone replacement therapy (postmenopausal): Secondary | ICD-10-CM

## 2017-12-14 DIAGNOSIS — Z8249 Family history of ischemic heart disease and other diseases of the circulatory system: Secondary | ICD-10-CM | POA: Insufficient documentation

## 2017-12-14 DIAGNOSIS — Z751 Person awaiting admission to adequate facility elsewhere: Secondary | ICD-10-CM

## 2017-12-14 DIAGNOSIS — E039 Hypothyroidism, unspecified: Secondary | ICD-10-CM | POA: Insufficient documentation

## 2017-12-14 DIAGNOSIS — F419 Anxiety disorder, unspecified: Secondary | ICD-10-CM | POA: Insufficient documentation

## 2017-12-14 DIAGNOSIS — G40909 Epilepsy, unspecified, not intractable, without status epilepticus: Secondary | ICD-10-CM | POA: Insufficient documentation

## 2017-12-14 DIAGNOSIS — F329 Major depressive disorder, single episode, unspecified: Secondary | ICD-10-CM | POA: Insufficient documentation

## 2017-12-14 DIAGNOSIS — I82402 Acute embolism and thrombosis of unspecified deep veins of left lower extremity: Secondary | ICD-10-CM | POA: Insufficient documentation

## 2017-12-14 HISTORY — DX: Spondylosis without myelopathy or radiculopathy, cervical region: M47.812

## 2017-12-14 HISTORY — DX: Presence of spectacles and contact lenses: Z97.3

## 2017-12-14 HISTORY — DX: Arthropathy, unspecified: M12.9

## 2017-12-14 LAB — BASIC METABOLIC PANEL
ANION GAP: 8 mmol/L (ref 5–15)
BUN/CREA RATIO: 10 (ref 6–20)
BUN: 14 mg/dL (ref 8–26)
CALCIUM: 9 mg/dL (ref 8.9–10.3)
CHLORIDE: 107 mmol/L (ref 101–111)
CO2 TOTAL: 24 mmol/L (ref 22–32)
CREATININE: 1.41 mg/dL — ABNORMAL HIGH (ref 0.90–1.30)
ESTIMATED GFR: 52 mL/min/{1.73_m2} — ABNORMAL LOW (ref >60)
GLUCOSE: 121 mg/dL — ABNORMAL HIGH (ref 70–110)
POTASSIUM: 4.6 mmol/L (ref 3.6–5.1)
SODIUM: 139 mmol/L (ref 136–144)

## 2017-12-14 LAB — PTT (PARTIAL THROMBOPLASTIN TIME)
APTT: 32.5 s (ref 25.1–36.5)
APTT: 40.3 s — ABNORMAL HIGH (ref 25.1–36.5)
APTT: 40.3 seconds — ABNORMAL HIGH (ref 25.1–36.5)

## 2017-12-14 LAB — CBC
HCT: 26.5 % — ABNORMAL LOW (ref 38.9–52.0)
HGB: 8 g/dL — ABNORMAL LOW (ref 13.4–17.5)
MCH: 27.8 pg (ref 26.0–32.0)
MCHC: 30.2 g/dL — ABNORMAL LOW (ref 31.0–35.5)
MCV: 92 fL (ref 78.0–100.0)
MPV: 10.4 fL (ref 8.7–12.5)
PLATELETS: 196 x10ˆ3/uL (ref 150–400)
RBC: 2.88 x10ˆ6/uL — ABNORMAL LOW (ref 4.50–6.10)
RDW-CV: 14.2 % (ref 11.5–15.5)
WBC: 7.6 x10ˆ3/uL (ref 3.7–11.0)

## 2017-12-14 LAB — CBC WITH DIFF
BASOPHIL #: <0.1 x10ˆ3/uL (ref <=0.20)
BASOPHIL %: 1 %
EOSINOPHIL #: 0.3 10*3/uL (ref <=0.50)
EOSINOPHIL %: 4 %
HCT: 29 % — ABNORMAL LOW (ref 38.9–52.0)
HGB: 8.7 g/dL — ABNORMAL LOW (ref 13.4–17.5)
IMMATURE GRANULOCYTE #: <0.1 x10ˆ3/uL (ref <0.10)
IMMATURE GRANULOCYTE %: 1 % (ref 0–1)
LYMPHOCYTE #: 0.95 x10ˆ3/uL — ABNORMAL LOW (ref 1.00–4.80)
LYMPHOCYTE %: 13 %
MCH: 27.7 pg (ref 26.0–32.0)
MCHC: 30 g/dL — ABNORMAL LOW (ref 31.0–35.5)
MCV: 92.4 fL (ref 78.0–100.0)
MONOCYTE #: 0.76 x10ˆ3/uL (ref 0.20–1.10)
MONOCYTE %: 10 %
MPV: 9.8 fL (ref 8.7–12.5)
NEUTROPHIL #: 5.44 x10ˆ3/uL (ref 1.50–7.70)
NEUTROPHIL %: 71 %
PLATELETS: 205 x10ˆ3/uL (ref 150–400)
RBC: 3.14 x10ˆ6/uL — ABNORMAL LOW (ref 4.50–6.10)
RDW-CV: 14.3 % (ref 11.5–15.5)
RDW-CV: 14.3 % (ref 11.5–15.5)
WBC: 7.6 10*3/uL (ref 3.7–11.0)

## 2017-12-14 LAB — PT/INR
INR: 1.15 (ref 0.80–1.20)
PROTHROMBIN TIME: 12.9 s — ABNORMAL HIGH (ref 9.4–12.5)

## 2017-12-14 MED ORDER — HEPARIN (PORCINE) 25,000 UNIT/250 ML IN 0.45 % SODIUM CHLORIDE IV SOLN
12.0000 [IU]/kg/h | INTRAVENOUS | Status: DC
Start: 2017-12-14 — End: 2017-12-14
  Administered 2017-12-14 (×2): 12 [IU]/kg/h via INTRAVENOUS
  Filled 2017-12-14: qty 250

## 2017-12-14 MED ORDER — HEPARIN (PORCINE) 25,000 UNIT/250 ML IN 0.45 % SODIUM CHLORIDE IV SOLN
14.00 [IU]/kg/h | INTRAVENOUS | Status: DC
Start: 2017-12-15 — End: 2017-12-15
  Administered 2017-12-14: 14 [IU]/kg/h via INTRAVENOUS
  Filled 2017-12-14: qty 250

## 2017-12-14 MED ORDER — IOVERSOL 320 MG IODINE/ML INTRAVENOUS SYRINGE
120.00 mL | INJECTION | INTRAVENOUS | Status: AC
Start: 2017-12-14 — End: 2017-12-14
  Administered 2017-12-14: 86 mL via INTRAVENOUS

## 2017-12-14 MED ORDER — IOVERSOL 320 MG IODINE/ML INTRAVENOUS SYRINGE
INJECTION | INTRAVENOUS | Status: AC
Start: 2017-12-14 — End: 2017-12-14
  Filled 2017-12-14: qty 125

## 2017-12-14 MED ADMIN — heparin (porcine) 25,000 unit/250 mL in 0.45 % sodium chloride IV soln: INTRAVENOUS

## 2017-12-14 NOTE — ED Nurses Note (Signed)
Report given to med surg nurse

## 2017-12-14 NOTE — ED Triage Notes (Signed)
Patient rec'd outpatient ultrasound, dx: with blood clot in leg. Sent to ER for eval.

## 2017-12-14 NOTE — H&P (Signed)
[facility] -- Admission H&P    Name: Richard [name]  Age: 57 y.o.   Gender: male  MRN: [id number]  Date of Admission:  12/14/2017  PCP: Gavin Potters, APRN  Attending: Timmie Foerster, DO  Consultants:     Chief Complaint:   Chief Complaint   Patient presents with   . Leg Pain       HPI:  This is 57 y.o. male who was evaluated by PCP yesterday for bilateral leg pain, left > right.  He continued to have pain and was ordered for ultrasound of the left lower extremity, which revealed extensive DVT.  The patient was recently admitted to [facility] for what was diagnosed as diverticular bleeding, required 6 units of blood at that time.  Concern was for acute recent bleeding and new DVT and PE on CT imaging, so ER contacted [facility] and was advised to start heparin gtt with no bolus and transfer once bed is available.  The rest of his work-up in the ER revealed hgb of 8.7 (was 9.1 and 9.5 previously), creatinine of 1.41 (baseline around 1.2), INR of 1.15, CT Chest revealed bilateral PE.  Patient was admitted to hospitalist service at [facility] awaiting bed availability at [facility].    History:  Past Medical History:   Diagnosis Date   . Anxiety    . BPH (benign prostatic hyperplasia)    . Congenital anomaly of kidney     HORSESHOE KIDNEY   . Depression    . Diarrhea     CDIFF   . Epilepsy (CMS [place])     OVER 10 YEARS SINCE LAST SEIZURE-DR WIEMER   . Esophageal reflux    . GIB (gastrointestinal bleeding) 2017   . High blood pressure    . Hx of aortic valve replacement    . Hypercholesterolemia    . Hyperlipidemia    . Hypothyroidism    . Mass of esophagus    . Nodular prostate    . Palpitations    . Pneumonia    . Testicular hypofunction          Social History     Substance and Sexual Activity   Drug Use Never     Allergies   Allergen Reactions   . Statins-Hmg-Coa Reductase Inhibitors Nausea/ Vomiting     Family Medical History:     Problem Relation (Age of Onset)    Blood Clots Paternal Grandmother     Coronary Artery Disease Mother, Father    Epilepsy Sister    Heart Attack Father    High Cholesterol Mother, Father    Hypertension (High Blood Pressure) Sister, Sister    Stroke Mother    Thyroid Disease Mother            Social History     Tobacco Use   . Smoking status: Never Smoker   . Smokeless tobacco: Never Used   Substance Use Topics   . Alcohol use: Yes     Alcohol/week: 1.0 standard drinks     Types: 1 Glasses of wine per week     Binge frequency: Less than monthly   . Drug use: Never       Review of Systems:  General - No fevers or chills.  Neuro/psych -  No new weakness/numbness.  No SI/HI  HEENT/neck - No neck stiffness or sore throat.  No acute vision/hearing complaints.  CV - No chest pains or palpitations.  Lungs - Denies  cough or shortness of breath  Abd - No abdomen pain.  No n/v/d, recent admission for diverticular bleed but denies current discolored stools.  Ext - Refer to HPI  MSK - No acute trauma  Skin - No new rashes  Endo -Denies polydipsia or polyuria.  GU - Denies dysuria or polyuria.  Filed Vitals:    12/14/17 1656 12/14/17 1657 12/14/17 1843   BP:  117/76 140/71   Pulse:  95 88   Resp:  18 17   Temp: 36.7 C (98.1 F)     SpO2:  100% 98%       Physical Examination:  Today's Physical Exam:  Constitutional:  Patient lying in bed, in no acute distress at time of exam  ENT:  ENMT without erythema or injection, mucous membranes moist, No oral lesions.   Neck:  no thyromegaly or lymphadenopathy and supple, symmetrical, trachea midline  Respiratory:  Clear to auscultation bilaterally.   Cardiovascular:  regular rate and rhythm, grade 2/6 SEM RSB, trace edema noted to left lower extremity  Gastrointestinal:  Soft, non-tender, Bowel sounds normal, No masses  Musculoskeletal:  Head atraumatic and normocephalic  Integumentary:  Skin warm and dry and No rashes  Neurologic:  Grossly normal, CN II - XII grossly intact , Alert and oriented x3  Psychiatric:  Normal affect, behavior, memory, thought  content, judgement, and speech.  Recent Labs/Radiology:  Results for orders placed or performed during the hospital encounter of 12/14/17 (from the past 24 hour(s))   CBC/DIFF    Narrative    The following orders were created for panel order CBC/DIFF.  Procedure                               Abnormality         Status                     ---------                               -----------         ------                     CBC WITH [id number]]                Abnormal            Final result                 Please view results for these tests on the individual orders.   BASIC METABOLIC PANEL   Result Value Ref Range    SODIUM 139 136 - 144 mmol/L    POTASSIUM 4.6 3.6 - 5.1 mmol/L    CHLORIDE 107 101 - 111 mmol/L    CO2 TOTAL 24 22 - 32 mmol/L    ANION GAP 8 5 - 15 mmol/L    CALCIUM 9.0 8.9 - 10.3 mg/dL    GLUCOSE 121 (H) 70 - 110 mg/dL    BUN 14 8 - 26 mg/dL    CREATININE 1.41 (H) 0.90 - 1.30 mg/dL    BUN/CREA RATIO 10 6 - 20    ESTIMATED GFR 52 (L) >60 mL/min/1.64m^2   PT/INR   Result Value Ref Range    PROTHROMBIN TIME 12.9 (H) 9.4 - 12.5 seconds    INR 1.15 0.80 - 1.20  PTT (PARTIAL THROMBOPLASTIN TIME)   Result Value Ref Range    APTT 32.5 25.1 - 36.5 seconds    Narrative    aPTT THERAPEUTIC RANGE  57.4 - 102.7 seconds                               CBC WITH DIFF   Result Value Ref Range    WBC 7.6 3.7 - 11.0 x10^3/uL    RBC 3.14 (L) 4.50 - 6.10 x10^6/uL    HGB 8.7 (L) 13.4 - 17.5 g/dL    HCT 29.0 (L) 38.9 - 52.0 %    MCV 92.4 78.0 - 100.0 fL    MCH 27.7 26.0 - 32.0 pg    MCHC 30.0 (L) 31.0 - 35.5 g/dL    RDW-CV 14.3 11.5 - 15.5 %    PLATELETS 205 150 - 400 x10^3/uL    MPV 9.8 8.7 - 12.5 fL    NEUTROPHIL % 71 %    LYMPHOCYTE % 13 %    MONOCYTE % 10 %    EOSINOPHIL % 4 %    BASOPHIL % 1 %    NEUTROPHIL # 5.44 1.50 - 7.70 x10^3/uL    LYMPHOCYTE # 0.95 (L) 1.00 - 4.80 x10^3/uL    MONOCYTE # 0.76 0.20 - 1.10 x10^3/uL    EOSINOPHIL # 0.30 <=0.50 x10^3/uL    BASOPHIL # <0.10 <=0.20 x10^3/uL    IMMATURE GRANULOCYTE  % 1 0 - 1 %    IMMATURE GRANULOCYTE # <0.10 <0.10 x10^3/uL     CT CHEST W IV CONTRAST   Final Result   1. Findings consistent with bilateral acute pulmonary emboli. No evidence   for a pulmonary infarct.   2. No lung consolidations or pleural effusions.      The critical findings were discussed with Dr. Idelia Salm at the time of   interpretation.                  The CT exam was performed using one or more the following a dose reduction   techniques: Automated exposure control, adjustment of the mA and/or kV   according to the patient's size, or use of iterative reconstruction   technique.            Radiologist location ID: [id number]             Assessment and Plan:  DVT (deep venous thrombosis) (CMS [facility])  Patient Active Problem List   Diagnosis   . Hyperlipidemia   . Epilepsy (CMS [place])   . Congenital anomaly of kidney   . Mass of esophagus   . Hypothyroidism   . S/P AVR   . Essential hypertension   . History of stress test   . H/O echocardiogram   . Acute GI bleeding   . DVT (deep venous thrombosis) (CMS HCC)   . Pulmonary embolism (CMS HCC)     - Patient will be placed on heparin gtt per protocol with no bolus per [facility] recommendations.  He will be transferred to [facility] once bed available.  Will monitor his hemoglobin due to recent diverticular bleeding and 6 units of PRBC transfusion requirement during recent admission at [facility].    Timmie Foerster, DO

## 2017-12-14 NOTE — ED Provider Notes (Addendum)
[facility] OF EMERGENCY MEDICINE  EMERGENCY DEPARTMENT HISTORY AND PHYSICAL      Chief Complaint:    Patient is a 56 y.o.  male presenting to the ED with chief complaint of having a DVT     History of Present Illness:  Patient is a 57 year old male who presents emergency department from ultrasound after being diagnosed with an extensive DVT in his left lower extremity.  He was referred to the ER by his primary care provider for further evaluation.  Patient states that his swelling is been ongoing for about a week.  He also admits to having an artificial heart valve.  In addition, he recently was at [facility] for a bleeding diverticulum.  He required 6 units of blood.  His hemoglobin is running in the low 9's.      Review of Systems:    Constitutional: No fever, chills  Skin: No rashes or lesions  MSK: No joint pain.  No neck or back pain  Neuro: No numbness, tingling, or weakness.  Psych: No SI or HI. Normal mood  All other systems were reviewed and were negative except for what is mentioned in the HPI.    Past Medical History:  Past Medical History:   Diagnosis Date   . Anxiety    . Arthropathy    . BPH (benign prostatic hyperplasia)    . Congenital anomaly of kidney     HORSESHOE KIDNEY   . Depression    . Diarrhea     CDIFF   . Epilepsy (CMS [place])     OVER 10 YEARS SINCE LAST SEIZURE-DR WIEMER   . Esophageal reflux    . GIB (gastrointestinal bleeding) 2017   . Headache    . High blood pressure    . Hx of aortic valve replacement    . Hypercholesterolemia    . Hyperlipidemia    . Hypothyroidism    . Mass of esophagus    . Nodular prostate    . Osteoarthritis cervical spine    . Palpitations    . Pneumonia    . Testicular hypofunction    . Wears glasses      Past Surgical History:   Procedure Laterality Date   . Colonoscopy     . Hx aortic valve replacement  2009   . Hx colectomy  2016   . Hx hernia repair     . Hx turp     . Hx upper endoscopy  04/05/2016   . Incisional hernia repair        . Ventral hernia repair         Above history reviewed with patient.  Allergies, medication list, and old records also reviewed.     Patient Active Problem List   Diagnosis   . Hyperlipidemia   . Epilepsy (CMS [place])   . Congenital anomaly of kidney   . Mass of esophagus   . Hypothyroidism   . S/P AVR   . Essential hypertension   . History of stress test   . H/O echocardiogram   . Acute GI bleeding   . DVT (deep venous thrombosis) (CMS [facility])   . Pulmonary embolism (CMS [facility])   . Bilateral pulmonary embolism (CMS [facility])       Social History:    Social History     Tobacco Use   . Smoking status: Never Smoker   . Smokeless tobacco: Never Used   Substance Use Topics   . Alcohol use:  Yes     Alcohol/week: 1.0 standard drinks     Types: 1 Glasses of wine per week     Binge frequency: Less than monthly   . Drug use: Never         Filed Vitals:    12/14/17 2117 12/15/17 0523 12/15/17 0851 12/15/17 1347   BP: 131/75 117/70 130/73 119/69   Pulse: 91 80 90 75   Resp: 18 18 18 17    Temp: 36.9 C (98.5 F) 36.8 C (98.2 F) 36.6 C (97.9 F) 36.8 C (98.3 F)   SpO2: 99% 98% 97% 98%       Physical Exam:     Nursing note and vitals reviewed.  Vital signs reviewed as above. No acute distress.   Constitutional: Pt is Pale  Head: Normocephalic and atraumatic.   Eyes: Conjunctivae are normal. Pupils are equal, round, and reactive to light. EOM are intact  Mouth: Moist, without erythema  Neck: Soft, supple, full range of motion.  Heart: Normal rate, 2/6 systolic ejection murmur.  Pulmonary/Chest: No respiratory distress, clear to auscultate  Abdomen: Soft, active bowel sounds, nontender, nondistended  Musculoskeletal: Normal range of motion.  Left lower extremity is mildly swollen.  Neurological: No focal deficits noted.  Skin: No rash or lesions  Psychiatric: Patient has a normal mood and affect.       Labs:      Results for orders placed or performed during the hospital encounter of 12/15/17   CBC   Result Value Ref Range    WBC 6.9 3.7  - 11.0 x10^3/uL    RBC 2.83 (L) 4.50 - 6.10 x10^6/uL    HGB 7.8 (L) 13.4 - 17.5 g/dL    HCT 25.4 (L) 38.9 - 52.0 %    MCV 89.8 78.0 - 100.0 fL    MCH 27.6 26.0 - 32.0 pg    MCHC 30.7 (L) 31.0 - 35.5 g/dL    RDW-CV 14.2 11.5 - 15.5 %    PLATELETS 169 150 - 400 x10^3/uL    MPV 10.5 8.7 - 12.5 fL   PTT (PARTIAL THROMBOPLASTIN TIME)   Result Value Ref Range    APTT 49.8 (H) 24.1 - 38.5 seconds   PT/INR   Result Value Ref Range    PROTHROMBIN TIME 13.8 9.5 - 14.1 seconds    INR 1.17 0.80 - 9.52   BASIC METABOLIC PANEL   Result Value Ref Range    SODIUM 140 136 - 145 mmol/L    POTASSIUM 4.3 3.5 - 5.1 mmol/L    CHLORIDE 106 96 - 111 mmol/L    CO2 TOTAL 25 22 - 32 mmol/L    ANION GAP 9 4 - 13 mmol/L    CALCIUM 8.5 8.5 - 10.2 mg/dL    GLUCOSE 115 65 - 139 mg/dL    BUN 17 8 - 25 mg/dL    CREATININE 1.32 (H) 0.62 - 1.27 mg/dL    BUN/CREA RATIO 13 6 - 22    ESTIMATED GFR 56 (L) >60 mL/min/1.67m^2   H & H   Result Value Ref Range    HGB 7.7 (L) 13.4 - 17.5 g/dL    HCT 25.6 (L) 38.9 - 52.0 %   PTT (PARTIAL THROMBOPLASTIN TIME)   Result Value Ref Range    APTT 123.3 (HH) 24.1 - 38.5 seconds   BASIC METABOLIC PANEL, NON-FASTING   Result Value Ref Range    SODIUM 137 136 - 145 mmol/L    POTASSIUM 4.5 3.5 - 5.1  mmol/L    CHLORIDE 107 96 - 111 mmol/L    CO2 TOTAL 23 22 - 32 mmol/L    ANION GAP 7 4 - 13 mmol/L    CALCIUM 8.6 8.5 - 10.2 mg/dL    GLUCOSE 96 65 - 139 mg/dL    BUN 14 8 - 25 mg/dL    CREATININE 1.15 0.62 - 1.27 mg/dL    BUN/CREA RATIO 12 6 - 22    ESTIMATED GFR >60 >60 mL/min/1.81m^2   CBC WITH DIFF   Result Value Ref Range    WBC 5.7 3.7 - 11.0 x10^3/uL    RBC 2.71 (L) 4.50 - 6.10 x10^6/uL    HGB 7.5 (L) 13.4 - 17.5 g/dL    HCT 24.5 (L) 38.9 - 52.0 %    MCV 90.4 78.0 - 100.0 fL    MCH 27.7 26.0 - 32.0 pg    MCHC 30.6 (L) 31.0 - 35.5 g/dL    RDW-CV 14.2 11.5 - 15.5 %    PLATELETS 168 150 - 400 x10^3/uL    MPV 10.3 8.7 - 12.5 fL    NEUTROPHIL % 65 %    LYMPHOCYTE % 18 %    MONOCYTE % 11 %    EOSINOPHIL % 5 %    BASOPHIL % 1  %    NEUTROPHIL # 3.78 1.50 - 7.70 x10^3/uL    LYMPHOCYTE # 1.03 1.00 - 4.80 x10^3/uL    MONOCYTE # 0.60 0.20 - 1.10 x10^3/uL    EOSINOPHIL # 0.26 <=0.50 x10^3/uL    BASOPHIL # <0.10 <=0.20 x10^3/uL    IMMATURE GRANULOCYTE % 0 0 - 1 %    IMMATURE GRANULOCYTE # <0.10 <0.10 x10^3/uL   PTT (PARTIAL THROMBOPLASTIN TIME)   Result Value Ref Range    APTT 59.4 (H) 24.1 - 38.5 seconds   H & H   Result Value Ref Range    HGB 7.7 (L) 13.4 - 17.5 g/dL    HCT 25.3 (L) 38.9 - 52.0 %   H & H   Result Value Ref Range    HGB 7.9 (L) 13.4 - 17.5 g/dL    HCT 26.0 (L) 38.9 - 52.0 %   PTT (PARTIAL THROMBOPLASTIN TIME)   Result Value Ref Range    APTT 62.1 (H) 24.1 - 38.5 seconds   H & H   Result Value Ref Range    HGB 7.6 (L) 13.4 - 17.5 g/dL    HCT 25.0 (L) 38.9 - 52.0 %   POC BLOOD GLUCOSE (RESULTS)   Result Value Ref Range    GLUCOSE, POC 130 (H) 70 - 105 mg/dl   Results for orders placed or performed during the hospital encounter of 59/93/57   BASIC METABOLIC PANEL   Result Value Ref Range    SODIUM 139 136 - 144 mmol/L    POTASSIUM 4.6 3.6 - 5.1 mmol/L    CHLORIDE 107 101 - 111 mmol/L    CO2 TOTAL 24 22 - 32 mmol/L    ANION GAP 8 5 - 15 mmol/L    CALCIUM 9.0 8.9 - 10.3 mg/dL    GLUCOSE 121 (H) 70 - 110 mg/dL    BUN 14 8 - 26 mg/dL    CREATININE 1.41 (H) 0.90 - 1.30 mg/dL    BUN/CREA RATIO 10 6 - 20    ESTIMATED GFR 52 (L) >60 mL/min/1.63m^2   PT/INR   Result Value Ref Range    PROTHROMBIN TIME 12.9 (H) 9.4 - 12.5 seconds  INR 1.15 0.80 - 1.20   PTT (PARTIAL THROMBOPLASTIN TIME)   Result Value Ref Range    APTT 32.5 25.1 - 36.5 seconds   CBC WITH DIFF   Result Value Ref Range    WBC 7.6 3.7 - 11.0 x10^3/uL    RBC 3.14 (L) 4.50 - 6.10 x10^6/uL    HGB 8.7 (L) 13.4 - 17.5 g/dL    HCT 29.0 (L) 38.9 - 52.0 %    MCV 92.4 78.0 - 100.0 fL    MCH 27.7 26.0 - 32.0 pg    MCHC 30.0 (L) 31.0 - 35.5 g/dL    RDW-CV 14.3 11.5 - 15.5 %    PLATELETS 205 150 - 400 x10^3/uL    MPV 9.8 8.7 - 12.5 fL    NEUTROPHIL % 71 %    LYMPHOCYTE % 13 %     MONOCYTE % 10 %    EOSINOPHIL % 4 %    BASOPHIL % 1 %    NEUTROPHIL # 5.44 1.50 - 7.70 x10^3/uL    LYMPHOCYTE # 0.95 (L) 1.00 - 4.80 x10^3/uL    MONOCYTE # 0.76 0.20 - 1.10 x10^3/uL    EOSINOPHIL # 0.30 <=0.50 x10^3/uL    BASOPHIL # <0.10 <=0.20 x10^3/uL    IMMATURE GRANULOCYTE % 1 0 - 1 %    IMMATURE GRANULOCYTE # <0.10 <0.10 x10^3/uL   CBC   Result Value Ref Range    WBC 7.6 3.7 - 11.0 x10^3/uL    RBC 2.88 (L) 4.50 - 6.10 x10^6/uL    HGB 8.0 (L) 13.4 - 17.5 g/dL    HCT 26.5 (L) 38.9 - 52.0 %    MCV 92.0 78.0 - 100.0 fL    MCH 27.8 26.0 - 32.0 pg    MCHC 30.2 (L) 31.0 - 35.5 g/dL    RDW-CV 14.2 11.5 - 15.5 %    PLATELETS 196 150 - 400 x10^3/uL    MPV 10.4 8.7 - 12.5 fL   PTT (PARTIAL THROMBOPLASTIN TIME)   Result Value Ref Range    APTT 40.3 (H) 25.1 - 36.5 seconds   CBC   Result Value Ref Range    WBC 5.8 3.7 - 11.0 x10^3/uL    RBC 2.73 (L) 4.50 - 6.10 x10^6/uL    HGB 7.6 (L) 13.4 - 17.5 g/dL    HCT 25.3 (L) 38.9 - 52.0 %    MCV 92.7 78.0 - 100.0 fL    MCH 27.8 26.0 - 32.0 pg    MCHC 30.0 (L) 31.0 - 35.5 g/dL    RDW-CV 14.2 11.5 - 15.5 %    PLATELETS 173 150 - 400 x10^3/uL    MPV 10.7 8.7 - 12.5 fL   PTT (PARTIAL THROMBOPLASTIN TIME)   Result Value Ref Range    APTT 60.8 (H) 25.1 - 36.5 seconds   LIGHT GREEN TOP TUBE   Result Value Ref Range    RAINBOW/EXTRA TUBE AUTO RESULT Yes    PTT (PARTIAL THROMBOPLASTIN TIME)   Result Value Ref Range    APTT 53.7 (H) 25.1 - 36.5 seconds   H & H   Result Value Ref Range    HGB 8.1 (L) 13.4 - 17.5 g/dL    HCT 26.4 (L) 38.9 - 52.0 %       Imaging:         Orders Placed This Encounter   . CT CHEST W IV CONTRAST   . CBC/DIFF   . BASIC METABOLIC PANEL   . PT/INR   .  PTT (PARTIAL THROMBOPLASTIN TIME)   . CBC WITH DIFF   . CBC   . PTT (PARTIAL THROMBOPLASTIN TIME)   . CBC   . CANCELED: PTT (PARTIAL THROMBOPLASTIN TIME)   . CANCELED: CBC   . PTT (PARTIAL THROMBOPLASTIN TIME)   . EXTRA TUBES - STJ   . LIGHT GREEN TOP TUBE   . PTT (PARTIAL THROMBOPLASTIN TIME)   . H & H   . CANCELED:  PTT (PARTIAL THROMBOPLASTIN TIME)   . CANCELED: H & H   . PATIENT CLASS/LEVEL OF CARE DESIGNATION - STJ   . PATIENT CLASS/LEVEL OF CARE CLARIFICATION - STJ   . ioversol (OPTIRAY 320) syringe   . levETIRAcetam (KEPPRA) 250 mg Oral Tablet   . heparin sod,pork in 0.45% NaCl (HEPARIN IN 0.45% NS) 25,000 unit/250 mL Intravenous Parenteral Solution   . levETIRAcetam (KEPPRA) tablet           MDM:   ED Course as of Dec 18 1907   Thu Dec 14, 2017   1859 Patient has been accepted to [facility] under the care of Dr. Lacinda Axon    [MF]   0931 Patient and has wife have been made aware of the plan of care and agreed to starting a low-intensity heparin drip without a bolus.  They also were made aware of his hemoglobin of 8.7.    [MF]      ED Course User Index  [MF] Rhunette Croft, MD     I spoke with Dr. Lacinda Axon who recommended low intensity heparin drip without a bolus due to the patient's recent GI bleed.     During the patient's stay in the emergency department, the above listed imaging and/or labs were performed to assist with medical decision making and were reviewed by myself as available for review.       IMPRESSION:   Encounter Diagnoses   Name Primary?   . DVT (deep vein thrombosis) in pregnancy Yes   . Acute pulmonary embolism, unspecified pulmonary embolism type, unspecified whether acute cor pulmonale present (CMS HCC)    . Anemia, unspecified type            DISPOSITION/PLAN:    Admitted  No follow-up provider specified.

## 2017-12-14 NOTE — ED Nurses Note (Signed)
Pt resting in bed with wife at bedside. Warm blankets provided.

## 2017-12-14 NOTE — ED Nurses Note (Signed)
Attempted to call report nurse unavailable to take report at this time

## 2017-12-14 NOTE — Care Management Notes (Signed)
[facility] INTAKE    PATIENT INFORMATION    PCP: Richard Potters, APRN  Effective Insurance:   Payor/Plan Subscr Relation Effective Group Num   1. Richard [name]  06/05/17                                    [address] [address]       Insurance Comments:     RESERVATION INFORMATION  Received call from:   Phone:   Referring Provider: Rhunette Croft         Referring Provider Phone: [phone]    Transfer Source: [facility]     Transfer Emergent:  Urgent  Transfer Reason: Level of Care not available at Current Facility  Transfer Comments:   Admitted on: 12/14/2017   Pt Class and Level of Care: Emergency Emergency  Diagnosis: LLE DVT; BILATERAL PEs      CLINICAL INFORMATION  Intubated: No    PMH:    Past Medical History:   Diagnosis Date   . Anxiety    . BPH (benign prostatic hyperplasia)    . Congenital anomaly of kidney     HORSESHOE KIDNEY   . Depression    . Diarrhea     CDIFF   . Epilepsy (CMS [place])     OVER 10 YEARS SINCE LAST SEIZURE-DR WIEMER   . Esophageal reflux    . GIB (gastrointestinal bleeding) 2017   . High blood pressure    . Hx of aortic valve replacement    . Hypercholesterolemia    . Hyperlipidemia    . Hypothyroidism    . Mass of esophagus    . Nodular prostate    . Palpitations    . Pneumonia    . Testicular hypofunction    artificial aortic valve  Dialysis: no  Cancer: no  Isolation: no  Is patient established with family practice/attending?      Patient story/clinical presentation: c/o a little SOB and swelling in LLE.  Ultrasound +DVT.  CT chest showed bilateral PEs.  Pt recent GI bleed, requiring 6 units blood.    Pt also has artificial aortic valve.    CT chest showed bilateral PEs.  REf phys connected with [facility] .  May start hep drip.      qSOFA Score >= 2 = Positive                 Value                   Score    Respiratory Rate >= 22 breaths per minute = 1 point 18    Systolic Blood Pressure <= 100 mmHg = 1 point 117    Altered Mental Status: GCS <15 = 1 point no     Total Score        Serum Lactate Level >= 65mol/L (36 mg/ dL)= Positive:  Initial call level (if not available put NA) na   Followed up call level          Positive Screen: no  Antibiotics: no  Fluids/Pressor:  no  Blood Cultures:  no  Serum Lactate: no    Current vital signs:    HR:     95   BP:     117/76   Resp:     18   Temp:     98.1   Sats:  100   O2:     ra       Per MD labs WNL unless noted below.    Labs:  WBC (3.6-11) 7.6   HGB (13.1-17.3) 8.7 and dropping   Hct (39.8-50.2)    PLT (140-400)    Na (135-145)    K+ (3.5-5.1)    CL (96-111)    CO2 (23-35)    BUN (8-26) 14   Cr (0.62-1.27) 1.41   Glucose (60-105)    Ca (8.5-10.4)    Mag (1.6-2.5)    Phos (2.4-4.7)    BNP (<100)    D-Dimer (<233)    AST (8-41)    ALT (<55)    Alk Phos (<150)    Amylase (25-125)    Lipase (10-80)    [name]. Bili (0.3-1.3)    PT (8.7-13.2) 12.9   PTT (25.1-36.5) 32.5   INR (0.8-1.2) 1.15   Trop-1 (<0.03)    CK    CPK    CK-MB    ABG ph    ABG PCO2    ABG PO2    ABG HCO3    Base def/exc    LP Glucose    LP Neutrophil    LP Protein    LP WBC    LP Other            Radiology (please place images on image grid):  CT chest   ultrasound  EKG:  IV Access:  Medications, IVF, Drips: Heparin    Oxygen/Bipap/Vent settings:    TV:        Peep:        FiO2:        Rate:            Admitting Pt Class and Level of Care: Inpatient, Step Down,   Accepted By: Sindy Guadeloupe, HOSPITALIST 55    *Send Text Page to accepting service MD. *

## 2017-12-15 ENCOUNTER — Encounter (HOSPITAL_COMMUNITY): Payer: Self-pay

## 2017-12-15 ENCOUNTER — Telehealth (INDEPENDENT_AMBULATORY_CARE_PROVIDER_SITE_OTHER): Payer: Self-pay | Admitting: Nurse Practitioner

## 2017-12-15 ENCOUNTER — Inpatient Hospital Stay (HOSPITAL_COMMUNITY): Payer: Medicare PPO | Admitting: Internal Medicine

## 2017-12-15 ENCOUNTER — Inpatient Hospital Stay
Admission: AD | Admit: 2017-12-15 | Discharge: 2017-12-18 | DRG: 176 | Disposition: A | Discharge status: Home Health | Payer: Medicare PPO | Source: Other Acute Inpatient Hospital | Attending: Internal Medicine | Admitting: Internal Medicine

## 2017-12-15 DIAGNOSIS — I82402 Acute embolism and thrombosis of unspecified deep veins of left lower extremity: Secondary | ICD-10-CM

## 2017-12-15 DIAGNOSIS — Z79899 Other long term (current) drug therapy: Secondary | ICD-10-CM

## 2017-12-15 DIAGNOSIS — E785 Hyperlipidemia, unspecified: Secondary | ICD-10-CM

## 2017-12-15 DIAGNOSIS — K921 Melena: Secondary | ICD-10-CM | POA: Diagnosis present

## 2017-12-15 DIAGNOSIS — N400 Enlarged prostate without lower urinary tract symptoms: Secondary | ICD-10-CM

## 2017-12-15 DIAGNOSIS — Z23 Encounter for immunization: Secondary | ICD-10-CM

## 2017-12-15 DIAGNOSIS — I82412 Acute embolism and thrombosis of left femoral vein: Secondary | ICD-10-CM | POA: Diagnosis present

## 2017-12-15 DIAGNOSIS — I2699 Other pulmonary embolism without acute cor pulmonale: Secondary | ICD-10-CM

## 2017-12-15 DIAGNOSIS — K219 Gastro-esophageal reflux disease without esophagitis: Secondary | ICD-10-CM | POA: Diagnosis present

## 2017-12-15 DIAGNOSIS — D649 Anemia, unspecified: Secondary | ICD-10-CM

## 2017-12-15 DIAGNOSIS — E039 Hypothyroidism, unspecified: Secondary | ICD-10-CM | POA: Diagnosis present

## 2017-12-15 DIAGNOSIS — F329 Major depressive disorder, single episode, unspecified: Secondary | ICD-10-CM | POA: Diagnosis present

## 2017-12-15 DIAGNOSIS — I10 Essential (primary) hypertension: Secondary | ICD-10-CM | POA: Diagnosis present

## 2017-12-15 DIAGNOSIS — F419 Anxiety disorder, unspecified: Secondary | ICD-10-CM | POA: Diagnosis present

## 2017-12-15 DIAGNOSIS — D500 Iron deficiency anemia secondary to blood loss (chronic): Secondary | ICD-10-CM | POA: Diagnosis present

## 2017-12-15 DIAGNOSIS — I82432 Acute embolism and thrombosis of left popliteal vein: Secondary | ICD-10-CM | POA: Diagnosis present

## 2017-12-15 DIAGNOSIS — Z79890 Hormone replacement therapy (postmenopausal): Secondary | ICD-10-CM

## 2017-12-15 DIAGNOSIS — N179 Acute kidney failure, unspecified: Secondary | ICD-10-CM | POA: Diagnosis present

## 2017-12-15 DIAGNOSIS — I82452 Acute embolism and thrombosis of left peroneal vein: Secondary | ICD-10-CM | POA: Diagnosis present

## 2017-12-15 DIAGNOSIS — G40909 Epilepsy, unspecified, not intractable, without status epilepticus: Secondary | ICD-10-CM

## 2017-12-15 DIAGNOSIS — Z952 Presence of prosthetic heart valve: Secondary | ICD-10-CM

## 2017-12-15 LAB — CBC
HCT: 25.3 % — ABNORMAL LOW (ref 38.9–52.0)
HCT: 25.4 % — ABNORMAL LOW (ref 38.9–52.0)
HGB: 7.6 g/dL — ABNORMAL LOW (ref 13.4–17.5)
HGB: 7.8 g/dL — ABNORMAL LOW (ref 13.4–17.5)
MCH: 27.8 pg (ref 26.0–32.0)
MCH: 27.6 pg (ref 26.0–32.0)
MCHC: 30 g/dL — ABNORMAL LOW (ref 31.0–35.5)
MCHC: 30.7 g/dL — ABNORMAL LOW (ref 31.0–35.5)
MCV: 92.7 fL (ref 78.0–100.0)
MCV: 89.8 fL (ref 78.0–100.0)
MPV: 10.7 fL (ref 8.7–12.5)
MPV: 10.5 fL (ref 8.7–12.5)
PLATELETS: 173 10*3/uL (ref 150–400)
PLATELETS: 169 10*3/uL (ref 150–400)
PLATELETS: 169 x10ˆ3/uL (ref 150–400)
RBC: 2.73 10*6/uL — ABNORMAL LOW (ref 4.50–6.10)
RBC: 2.83 10*6/uL — ABNORMAL LOW (ref 4.50–6.10)
RBC: 2.83 x10?6/uL — ABNORMAL LOW (ref 4.50–6.10)
RDW-CV: 14.2 % (ref 11.5–15.5)
RDW-CV: 14.2 % (ref 11.5–15.5)
WBC: 5.8 10*3/uL (ref 3.7–11.0)
WBC: 6.9 x10ˆ3/uL (ref 3.7–11.0)

## 2017-12-15 LAB — BASIC METABOLIC PANEL
ANION GAP: 9 mmol/L (ref 4–13)
BUN/CREA RATIO: 13 (ref 6–22)
BUN: 17 mg/dL (ref 8–25)
CALCIUM: 8.5 mg/dL (ref 8.5–10.2)
CALCIUM: 8.5 mg/dL (ref 8.5–10.2)
CHLORIDE: 106 mmol/L (ref 96–111)
CO2 TOTAL: 25 mmol/L (ref 22–32)
CREATININE: 1.32 mg/dL — ABNORMAL HIGH (ref 0.62–1.27)
ESTIMATED GFR: 56 mL/min/{1.73_m2} — ABNORMAL LOW (ref >60)
GLUCOSE: 115 mg/dL (ref 65–139)
POTASSIUM: 4.3 mmol/L (ref 3.5–5.1)
SODIUM: 140 mmol/L (ref 136–145)

## 2017-12-15 LAB — PTT (PARTIAL THROMBOPLASTIN TIME)
APTT: 60.8 s — ABNORMAL HIGH (ref 25.1–36.5)
APTT: 53.7 s — ABNORMAL HIGH (ref 25.1–36.5)
APTT: 49.8 s — ABNORMAL HIGH (ref 24.1–38.5)

## 2017-12-15 LAB — PT/INR
INR: 1.17 (ref 0.80–1.20)
PROTHROMBIN TIME: 13.8 s (ref 9.5–14.1)

## 2017-12-15 LAB — LIGHT GREEN TOP TUBE

## 2017-12-15 LAB — H & H
HCT: 26.4 % — ABNORMAL LOW (ref 38.9–52.0)
HGB: 8.1 g/dL — ABNORMAL LOW (ref 13.4–17.5)

## 2017-12-15 LAB — POC BLOOD GLUCOSE (RESULTS): GLUCOSE, POC: 130 mg/dL — ABNORMAL HIGH (ref 70–105)

## 2017-12-15 MED ORDER — TAMSULOSIN 0.4 MG CAPSULE
0.40 mg | ORAL_CAPSULE | Freq: Every evening | ORAL | Status: DC
Start: 2017-12-15 — End: 2017-12-18
  Administered 2017-12-15 – 2017-12-17 (×3): 0.4 mg via ORAL
  Filled 2017-12-15 (×3): qty 1

## 2017-12-15 MED ORDER — DOCUSATE SODIUM 100 MG CAPSULE
100.00 mg | ORAL_CAPSULE | Freq: Two times a day (BID) | ORAL | Status: DC
Start: 2017-12-15 — End: 2017-12-18
  Administered 2017-12-15: 0 mg via ORAL
  Administered 2017-12-16 – 2017-12-18 (×5): 100 mg via ORAL
  Filled 2017-12-15 (×5): qty 1

## 2017-12-15 MED ORDER — PANTOPRAZOLE 40 MG TABLET,DELAYED RELEASE
40.0000 mg | DELAYED_RELEASE_TABLET | Freq: Every day | ORAL | Status: DC
Start: 2017-12-16 — End: 2017-12-18
  Administered 2017-12-16 – 2017-12-18 (×3): 40 mg via ORAL
  Filled 2017-12-15 (×3): qty 1

## 2017-12-15 MED ORDER — LEVETIRACETAM 250 MG TABLET
250.00 mg | ORAL_TABLET | Freq: Two times a day (BID) | ORAL | Status: DC
Start: 2017-12-15 — End: 2017-12-15
  Administered 2017-12-15: 250 mg via ORAL

## 2017-12-15 MED ORDER — CHOLECALCIFEROL (VITAMIN D3) 25 MCG (1,000 UNIT) TABLET
1000.0000 [IU] | ORAL_TABLET | Freq: Every day | ORAL | Status: DC
Start: 2017-12-15 — End: 2017-12-18
  Administered 2017-12-15 – 2017-12-18 (×4): 1000 [IU] via ORAL
  Filled 2017-12-15 (×4): qty 1

## 2017-12-15 MED ORDER — ONDANSETRON HCL (PF) 4 MG/2 ML INJECTION SOLUTION
4.0000 mg | Freq: Four times a day (QID) | INTRAMUSCULAR | Status: DC | PRN
Start: 2017-12-15 — End: 2017-12-18

## 2017-12-15 MED ORDER — SODIUM CHLORIDE 0.9 % INTRAVENOUS SOLUTION
INTRAVENOUS | Status: DC
Start: 2017-12-15 — End: 2017-12-16

## 2017-12-15 MED ORDER — MAGNESIUM HYDROXIDE 400 MG/5 ML ORAL SUSPENSION
15.0000 mL | Freq: Four times a day (QID) | ORAL | Status: DC | PRN
Start: 2017-12-15 — End: 2017-12-18

## 2017-12-15 MED ORDER — AMITRIPTYLINE 10 MG TABLET
10.00 mg | ORAL_TABLET | Freq: Every evening | ORAL | Status: DC
Start: 2017-12-15 — End: 2017-12-18
  Administered 2017-12-15 – 2017-12-17 (×3): 10 mg via ORAL
  Filled 2017-12-15 (×4): qty 1

## 2017-12-15 MED ORDER — CALCIUM 200 MG (AS CITRATE)-VITAMIN D3 6.25 MCG (250 UNIT) TABLET
1.0000 | ORAL_TABLET | Freq: Every day | ORAL | Status: DC
Start: 2017-12-15 — End: 2017-12-18
  Administered 2017-12-15 – 2017-12-18 (×4): 1 via ORAL
  Filled 2017-12-15 (×4): qty 1

## 2017-12-15 MED ORDER — PSYLLIUM ORAL PACKET WRAPPER
1.00 | PACK | Freq: Two times a day (BID) | Status: DC
Start: 2017-12-15 — End: 2017-12-18
  Administered 2017-12-15 – 2017-12-18 (×6): 1 via ORAL
  Filled 2017-12-15 (×7): qty 1

## 2017-12-15 MED ORDER — LEVETIRACETAM 250 MG TABLET
250.0000 mg | ORAL_TABLET | Freq: Once | ORAL | Status: AC
Start: 2017-12-15 — End: 2017-12-15
  Administered 2017-12-15: 250 mg via ORAL
  Filled 2017-12-15: qty 1

## 2017-12-15 MED ORDER — HYDROCODONE 5 MG-ACETAMINOPHEN 325 MG TABLET
1.0000 | ORAL_TABLET | ORAL | Status: DC | PRN
Start: 2017-12-15 — End: 2017-12-18

## 2017-12-15 MED ORDER — LEVOTHYROXINE 75 MCG TABLET
100.00 ug | ORAL_TABLET | Freq: Every morning | ORAL | Status: DC
Start: 2017-12-15 — End: 2017-12-15
  Administered 2017-12-15: 05:00:00 100 ug via ORAL
  Filled 2017-12-15 (×2): qty 1

## 2017-12-15 MED ORDER — AMITRIPTYLINE 10 MG TABLET
10.00 mg | ORAL_TABLET | Freq: Every evening | ORAL | Status: DC
Start: 2017-12-15 — End: 2017-12-15
  Filled 2017-12-15: qty 1

## 2017-12-15 MED ORDER — HEPARIN (PORCINE) 25,000 UNIT/250 ML IN 0.45 % SODIUM CHLORIDE IV SOLN
14.00 [IU]/kg/h | INTRAVENOUS | Status: DC
Start: 2017-12-14 — End: 2017-12-15
  Administered 2017-12-14 – 2017-12-15 (×4): 14 [IU]/kg/h via INTRAVENOUS
  Filled 2017-12-15 (×2): qty 250

## 2017-12-15 MED ORDER — LEVETIRACETAM 250 MG TABLET
500.00 mg | ORAL_TABLET | Freq: Two times a day (BID) | ORAL | Status: DC
Start: 2017-12-15 — End: 2017-12-15
  Administered 2017-12-15: 0 mg via ORAL
  Filled 2017-12-15 (×2): qty 2

## 2017-12-15 MED ORDER — SODIUM CHLORIDE 0.9 % (FLUSH) INJECTION SYRINGE
2.0000 mL | INJECTION | INTRAMUSCULAR | Status: DC | PRN
Start: 2017-12-15 — End: 2017-12-18

## 2017-12-15 MED ORDER — MELATONIN 5 MG TABLET
10.00 mg | ORAL_TABLET | Freq: Every evening | ORAL | Status: DC
Start: 2017-12-15 — End: 2017-12-15

## 2017-12-15 MED ORDER — ACETAMINOPHEN 325 MG TABLET
650.00 mg | ORAL_TABLET | ORAL | Status: DC | PRN
Start: 2017-12-15 — End: 2017-12-15
  Administered 2017-12-15: 650 mg via ORAL
  Filled 2017-12-15: qty 2

## 2017-12-15 MED ORDER — FLUTICASONE PROPIONATE 50 MCG/ACTUATION NASAL SPRAY,SUSPENSION
2.0000 | Freq: Every day | NASAL | Status: DC
Start: 2017-12-15 — End: 2017-12-18
  Administered 2017-12-15 – 2017-12-18 (×4): 2 via NASAL
  Filled 2017-12-15: qty 16

## 2017-12-15 MED ORDER — HEPARIN (PORCINE) 25,000 UNIT/250 ML IN 0.45 % SODIUM CHLORIDE IV SOLN
12.0000 [IU]/kg/h | INTRAVENOUS | Status: DC
Start: 2017-12-15 — End: 2017-12-15
  Filled 2017-12-15: qty 250

## 2017-12-15 MED ORDER — ACETAMINOPHEN 325 MG TABLET
650.0000 mg | ORAL_TABLET | ORAL | Status: DC | PRN
Start: 2017-12-15 — End: 2017-12-15

## 2017-12-15 MED ORDER — ACETAMINOPHEN 325 MG TABLET
650.0000 mg | ORAL_TABLET | ORAL | Status: DC | PRN
Start: 2017-12-15 — End: 2017-12-18
  Administered 2017-12-16 – 2017-12-18 (×3): 650 mg via ORAL
  Filled 2017-12-15 (×3): qty 2

## 2017-12-15 MED ORDER — HEPARIN (PORCINE) 5,000 UNITS/ML BOLUS
80.0000 [IU]/kg | Freq: Once | INTRAMUSCULAR | Status: AC
Start: 2017-12-15 — End: 2017-12-15
  Administered 2017-12-15: 6000 [IU] via INTRAVENOUS
  Filled 2017-12-15: qty 2

## 2017-12-15 MED ORDER — MULTIVITAMIN TABLET
1.0000 | ORAL_TABLET | Freq: Every day | ORAL | Status: DC
Start: 2017-12-15 — End: 2017-12-18
  Administered 2017-12-15 – 2017-12-18 (×4): 1 via ORAL
  Filled 2017-12-15 (×4): qty 1

## 2017-12-15 MED ORDER — ALBUTEROL SULFATE 2.5 MG/3 ML (0.083 %) SOLUTION FOR NEBULIZATION
2.5000 mg | INHALATION_SOLUTION | RESPIRATORY_TRACT | Status: DC | PRN
Start: 2017-12-15 — End: 2017-12-18

## 2017-12-15 MED ORDER — PANTOPRAZOLE 40 MG TABLET,DELAYED RELEASE
40.0000 mg | DELAYED_RELEASE_TABLET | Freq: Every day | ORAL | Status: DC
Start: 2017-12-15 — End: 2017-12-15
  Administered 2017-12-15: 40 mg via ORAL
  Filled 2017-12-15: qty 1

## 2017-12-15 MED ORDER — LEVOTHYROXINE 100 MCG TABLET
100.00 ug | ORAL_TABLET | Freq: Every morning | ORAL | Status: DC
Start: 2017-12-16 — End: 2017-12-18
  Administered 2017-12-16 – 2017-12-18 (×3): 100 ug via ORAL
  Filled 2017-12-15 (×3): qty 1

## 2017-12-15 MED ORDER — MELATONIN 5 MG TABLET
10.00 mg | ORAL_TABLET | Freq: Every evening | ORAL | Status: DC | PRN
Start: 2017-12-15 — End: 2017-12-15
  Administered 2017-12-15: 10 mg via ORAL
  Filled 2017-12-15: qty 2

## 2017-12-15 MED ORDER — LEVETIRACETAM 250 MG TABLET
250.00 mg | ORAL_TABLET | Freq: Two times a day (BID) | ORAL | Status: DC
Start: 2017-12-15 — End: 2017-12-18
  Administered 2017-12-15: 0 mg via ORAL
  Administered 2017-12-16 – 2017-12-18 (×5): 250 mg via ORAL
  Filled 2017-12-15 (×6): qty 1

## 2017-12-15 MED ORDER — HEPARIN (PORCINE) 25,000 UNIT/250 ML IN 0.45 % SODIUM CHLORIDE IV SOLN
14.00 [IU]/kg/h | INTRAVENOUS | 0 refills | Status: DC
Start: 2017-12-15 — End: 2017-12-18

## 2017-12-15 MED ORDER — TAMSULOSIN 0.4 MG CAPSULE
0.40 mg | ORAL_CAPSULE | Freq: Every evening | ORAL | Status: DC
Start: 2017-12-15 — End: 2017-12-15

## 2017-12-15 MED ORDER — SODIUM CHLORIDE 0.9 % (FLUSH) INJECTION SYRINGE
2.0000 mL | INJECTION | Freq: Three times a day (TID) | INTRAMUSCULAR | Status: DC
Start: 2017-12-15 — End: 2017-12-18
  Administered 2017-12-15 – 2017-12-18 (×7): 2 mL

## 2017-12-15 MED ORDER — ONDANSETRON HCL (PF) 4 MG/2 ML INJECTION SOLUTION
4.0000 mg | Freq: Four times a day (QID) | INTRAMUSCULAR | Status: DC | PRN
Start: 2017-12-15 — End: 2017-12-15

## 2017-12-15 MED ORDER — HEPARIN (PORCINE) 25,000 UNIT/250 ML (100 UNIT/ML) IN DEXTROSE 5 % IV
18.0000 [IU]/kg/h | INTRAVENOUS | Status: DC
Start: 2017-12-15 — End: 2017-12-16
  Administered 2017-12-15 (×6): 18 [IU]/kg/h via INTRAVENOUS
  Administered 2017-12-16: 17 [IU]/kg/h via INTRAVENOUS
  Administered 2017-12-16: 0 [IU]/kg/h via INTRAVENOUS
  Administered 2017-12-16: 17 [IU]/kg/h via INTRAVENOUS
  Administered 2017-12-16: 18 [IU]/kg/h via INTRAVENOUS
  Administered 2017-12-16 (×7): 17 [IU]/kg/h via INTRAVENOUS
  Administered 2017-12-16: 16 [IU]/kg/h via INTRAVENOUS
  Administered 2017-12-16: 17 [IU]/kg/h via INTRAVENOUS
  Filled 2017-12-15 (×2): qty 250

## 2017-12-15 MED ORDER — MELATONIN 3 MG TABLET
12.00 mg | ORAL_TABLET | Freq: Every evening | ORAL | Status: DC | PRN
Start: 2017-12-15 — End: 2017-12-18
  Administered 2017-12-15 – 2017-12-17 (×3): 12 mg via ORAL
  Filled 2017-12-15 (×3): qty 4

## 2017-12-15 MED ORDER — LEVETIRACETAM 250 MG TABLET
250.00 mg | ORAL_TABLET | Freq: Two times a day (BID) | ORAL | 11 refills | Status: DC
Start: 2017-12-15 — End: 2018-04-26

## 2017-12-15 MED ADMIN — EPINEPHRINE 1 MG/ML 1 ML IN BSS 500 ML: ORAL | @ 21:00:00 | NDC 00409724101

## 2017-12-15 MED ADMIN — Medication: ORAL | @ 05:00:00

## 2017-12-15 MED ADMIN — sodium chloride 0.9 % (flush) injection syringe: INTRAVENOUS | @ 20:00:00

## 2017-12-15 MED ADMIN — sodium chloride 0.9 % intravenous solution: ORAL | @ 16:00:00 | NDC 00338004904

## 2017-12-15 MED ADMIN — sodium chloride 0.9 % (flush) injection syringe: INTRAVENOUS | @ 19:00:00

## 2017-12-15 NOTE — Telephone Encounter (Signed)
-----   Message from Lyndel Safe, [place] sent at 12/14/2017  2:45 PM EDT -----  Results reviewed. Count has dropped slightly, but overall is stable. As long as no further bleeding, ok to monitor at this time. Lyndel Safe, PA-C  12/14/2017, 14:45

## 2017-12-15 NOTE — Discharge Summary (Signed)
[place] SUMMARY      PATIENT NAME:  Richard [name], Richard [name]  MRN:  [id number]  DOB:  1960/03/23    ENCOUNTER START DATE:  12/14/2017  INPATIENT ADMISSION DATE: 12/15/2017  DISCHARGE DATE:  12/15/2017    ATTENDING PHYSICIAN: Timmie Foerster, DO  SERVICE: [facility] MED/SURG  PRIMARY CARE PHYSICIAN: Gavin Potters, APRN     Reason for Admission     Diagnosis    Pulmonary embolism (CMS [facility]) [846659]    Pulmonary embolism (CMS [place]) [141700]          DISCHARGE DIAGNOSIS: DVT/PE    Principal Problem:  DVT (deep venous thrombosis) (CMS [facility])    Active Hospital Problems    Diagnosis Date Noted   . Principle Problem: DVT (deep venous thrombosis) (CMS [facility]) [I82.409] 12/14/2017   . Pulmonary embolism (CMS [facility]) [I26.99] 12/14/2017      Resolved Hospital Problems   No resolved problems to display.     Active Non-Hospital Problems    Diagnosis Date Noted   . Acute GI bleeding 11/23/2017   . History of stress test 11/10/2017   . H/O echocardiogram 11/10/2017   . S/P AVR 08/07/2017   . Essential hypertension 08/07/2017   . Hyperlipidemia 06/05/2017   . Epilepsy (CMS [place]) 06/05/2017   . 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 Hypothyroidism 06/05/2017      Allergies   Allergen Reactions   . Statins-Hmg-Coa Reductase Inhibitors Nausea/ Vomiting            DISCHARGE MEDICATIONS:     Current Discharge Medication List      START taking these medications.      Details   heparin in 0.45% NS 25,000 unit/250 mL Parenteral Solution   14 Units/kg/hr (1,092 Units/hr), Intravenous, CONTINUOUS  Qty:  1 mL  Refills:  0        CONTINUE these medications which have CHANGED during your visit.      Details   levETIRAcetam 250 mg Tablet  Commonly known as:  KEPPRA  What changed:  how much to take   250 mg, Oral, 2 TIMES DAILY  Qty:  1 Tab  Refills:  11        CONTINUE these medications - NO CHANGES were made during your visit.      Details   amitriptyline 10 mg Tablet  Commonly known as:  ELAVIL   10 mg,  Oral, NIGHTLY  Refills:  0     calcium citrate-vitamin D3 200 mg calcium -250 unit Tablet  Commonly known as:  CITRACAL   Oral, DAILY  Refills:  0     docusate sodium 100 mg Capsule  Commonly known as:  COLACE   100 mg, Oral, 2 TIMES DAILY  Refills:  0     FISH OIL-VIT D3 300-1,000-1,000 mg-mg-unit Capsule  Generic drug:  OM-3-DHA-EPA-Fish Oil-Vit D3   3 Caps, Oral, DAILY, Dosage 2000, mg fish oil/1000 iu Vitamin d3  Refills:  0     fluticasone propionate 50 mcg/actuation Spray, Suspension  Commonly known as:  FLONASE   2 Sprays, Each Nostril, DAILY  Refills:  0     levothyroxine 100 mcg Tablet  Commonly known as:  SYNTHROID   100 mcg, Oral, EVERY MORNING  Refills:  0     melatonin 5 mg Tablet   10 mg, Oral, NIGHTLY  Refills:  0     multivitamin Tablet   1 Tab, Oral,  DAILY  Refills:  0     pantoprazole 40 mg Tablet, Delayed Release (E.C.)  Commonly known as:  PROTONIX   40 mg, Oral, DAILY  Refills:  0     polyethylene glycol 17 gram/dose Powder  Commonly known as:  MIRALAX   17 g, Oral, DAILY  Refills:  0     psyllium Packet  Commonly known as:  METAMUCIL   1 Packet, Oral, 2 TIMES DAILY  Refills:  0     tamsulosin 0.4 mg Capsule  Commonly known as:  FLOMAX   0.4 mg, Oral, EVERY EVENING AFTER DINNER  Refills:  0     VITAMIN D2 ORAL   1 Tab, Oral, DAILY  Refills:  0          Discharge med list refreshed?  YES        DISCHARGE INSTRUCTIONS:    No discharge procedures on file.       REASON FOR HOSPITALIZATION AND HOSPITAL COURSE:  This is a 57 y.o., male who was evaluated by PCP yesterday for bilateral leg pain, left > right.  He continued to have pain and was ordered for ultrasound of the left lower extremity, which revealed extensive DVT.  The patient was recently admitted to [facility] for what was diagnosed as diverticular bleeding, required 6 units of blood at that time.  Concern was for acute recent bleeding and new DVT and PE on CT imaging, so ER contacted [facility] and was advised to start heparin gtt with no bolus and  transfer once bed is available.  The rest of his work-up in the ER revealed hgb of 8.7 (was 9.1 and 9.5 previously), creatinine of 1.41 (baseline around 1.2), INR of 1.15, CT Chest revealed bilateral PE.  Patient was admitted to hospitalist service at [facility] awaiting bed availability at [facility].  During admission, he had no signs of active bleeding, his PTT has been therapeutic range for last 2 checks, is due at 1900 for repeat H+H and PTT.  His hemoglobin at 12/15/17 was 8.1, was 7.6 and 8 previously, was 7.7 on 12/01/17.  Bed available at [facility].  Patient will be transferred to monitor closely with specialist availability for initiation of anti-coagulation in light of recent admission for transfusion requirement with GI bleeding.        CONDITION ON DISCHARGE:  A. Ambulation: Full ambulation  B. Self-care Ability: Complete  C. Cognitive Status Alert and Oriented x 3  D. DNR status at discharge: Full Code    Advance Directive Information      Most Recent Value   Does the Patient have an Advance Directive?  No, Information Offered and Given          DISCHARGE DISPOSITION:  Transfer to acute care facility.  The patient is being transferred to [facility] due to specialists not available here.  The benefits of the transfer outweigh the risks, and the patient has consented to the transfer.  The accepting facility has available space, time, and capacities to perform speciality care.    The ER has verified the capability at the accepting facility.  The nearest facility for the procedure was discussed with the patient.  Consideration for alternative procedures that may be performed at [facility] was considered.                      Timmie Foerster, DO        Copies sent to Care Team  Relationship Specialty Notifications Start End    Gavin Potters, APRN PCP - General NURSE PRACTITIONER  06/05/17     Verified by transitions team 11/27/17 --CMM    Phone: [phone] Fax: [phone]         [address]  [id number]          Referring providers can utilize [email or web address] to access their referred [place] patient's information.

## 2017-12-15 NOTE — Nurses Notes (Signed)
Dr. Brown in to see patient

## 2017-12-15 NOTE — Ancillary Notes (Signed)
Patient arrived as a direct admission and assigned to Dr Girard Cooter to admit to [facility]. Dr Lacinda Axon has been informed of the patient's arrival and will come to bedside to evaluate. Please call Dr Lacinda Axon with any questions or if patient has any change in clinical status.     Raphael Gibney, MD  12/15/2017, 17:58

## 2017-12-15 NOTE — Care Management Notes (Signed)
[place] Management Initial Evaluation    Patient Name: Richard [name]  Date of Birth: Oct 13, 1960  Sex: male  Date/Time of Admission: 12/14/2017  4:58 PM  Room/Bed: 215/B  Payor: [place] MEDICARE / Plan: [place] MEDICARE ADVANTAGE PPO / Product Type: PPO /   Primary Care Providers:  Gavin Potters, APRN, APRN (General)    Pharmacy Info:   Preferred [facility], [place].    [address] [place] [address]    Phone: [phone] Fax: [phone]    Not a 24 hour pharmacy; exact hours not known.        Emergency Contact Info:   Extended Emergency Contact Information  Primary Emergency Contact: [place], [place] Phone: [phone]  Relation: Wife  Preferred language: English    History:   Richard [name] is a 57 y.o., male, admitted ED to observation, waiting for transfer to [facility] available bed.    Height/Weight: 175.3 cm (5\' 9" ) / 88.9 kg (196 lb)     LOS: 0 days   Admitting Diagnosis: Pulmonary embolism (CMS HCC) [I26.99]  Pulmonary embolism (CMS HCC) [I26.99]    Assessment:   Case reviewed using MCG guidelines for pulmonary embolism-bleeding before anticoagulation, meets IP status/LOC. Status updated to IP current date and time. Will obtain authorization.    Discharge Plan:  [facility] Transfer (code 2)      The patient will continue to be evaluated for developing discharge needs.     Case Manager: Letitia Neri, CASE MANAGER  Phone: [phone]

## 2017-12-15 NOTE — Nurses Notes (Signed)
Telephone call to Dr. Owens Shark.  Notified of PTT level of 60.8.  No new orders.  PTT ordered in 6 hours per heparin protocol.  Oncoming shift notified.

## 2017-12-15 NOTE — Nursing Note (Signed)
Patient wife notified of lab results, stated that patient is waiting to be transferred to [facility] due to blood clots in legs and lungs. Karena Addison, LPN  02/89/0228, 40:69

## 2017-12-15 NOTE — Nurses Notes (Signed)
Patient wants to ambulated in hallway, Dr. Owens Shark aware. Verbal order received to let patient ambulate hallway.

## 2017-12-15 NOTE — Nurses Notes (Signed)
Patient concern regarding Keppra. Dr. Owens Shark notified.  New one time order receive to give patient 250mg  of Keppra at this time.

## 2017-12-15 NOTE — Progress Notes (Signed)
[facility] -- Progress Note  Name: Richard [name]  Age: 57 y.o.   Gender: male  MRN: [id number]  Date of Admission:  12/14/2017  PCP: Gavin Potters, APRN  Attending: Timmie Foerster, DO  Consultants:       Subjective:  Patient seen in room.  No new overnight events per staff.  Patient has remained on heparin gtt, no gross bleeding noted, but hemoglobin has continued to slowly drop and is at 7.6 this morning.  He denies any other acute issues at present.  Currently on bed hold for [place].    Objective:  Filed Vitals:    12/14/17 1843 12/14/17 2117 12/15/17 0523 12/15/17 0851   BP: 140/71 131/75 117/70 130/73   Pulse: 88 91 80 90   Resp: 17 18 18 18    Temp:  36.9 C (98.5 F) 36.8 C (98.2 F) 36.6 C (97.9 F)   SpO2: 98% 99% 98% 97%       I/O last 24 hours:      Intake/Output Summary (Last 24 hours) at 12/15/2017 0956  Last data filed at 12/15/2017 0800  Gross per 24 hour   Intake 592.58 ml   Output --   Net 592.58 ml       Physical Exam:  Today's Physical Exam:  Constitutional:  Lying in bed, in no acute distress at time of exam  ENT:  ENMT without erythema or injection, mucous membranes moist, No oral lesions.   Neck:  no thyromegaly or lymphadenopathy and supple, symmetrical, trachea midline  Respiratory:  Clear to auscultation bilaterally.   Cardiovascular:  regular rate and rhythm, S1, S2 normal, no murmur, click, rub or gallop, trace edema in lower extremities  Gastrointestinal:  Soft, non-tender, Bowel sounds normal, No masses  Musculoskeletal:  Head atraumatic and normocephalic  Integumentary:  Skin warm and dry and No rashes  Neurologic:  Grossly normal, CN II - XII grossly intact , Alert and oriented x3  Psychiatric:  Normal affect, behavior, memory, thought content, judgement, and speech.    Results for orders placed or performed during the hospital encounter of 12/14/17 (from the past 24 hour(s))   CBC/DIFF    Narrative    The following orders were created for panel order  CBC/DIFF.  Procedure                               Abnormality         Status                     ---------                               -----------         ------                     CBC WITH VWUJ[[id number]]                Abnormal            Final result                 Please view results for these tests on the individual orders.   BASIC METABOLIC PANEL   Result Value Ref Range    SODIUM 139 136 - 144 mmol/L    POTASSIUM 4.6 3.6 - 5.1 mmol/L  CHLORIDE 107 101 - 111 mmol/L    CO2 TOTAL 24 22 - 32 mmol/L    ANION GAP 8 5 - 15 mmol/L    CALCIUM 9.0 8.9 - 10.3 mg/dL    GLUCOSE 121 (H) 70 - 110 mg/dL    BUN 14 8 - 26 mg/dL    CREATININE 1.41 (H) 0.90 - 1.30 mg/dL    BUN/CREA RATIO 10 6 - 20    ESTIMATED GFR 52 (L) >60 mL/min/1.24m^2   PT/INR   Result Value Ref Range    PROTHROMBIN TIME 12.9 (H) 9.4 - 12.5 seconds    INR 1.15 0.80 - 1.20   PTT (PARTIAL THROMBOPLASTIN TIME)   Result Value Ref Range    APTT 32.5 25.1 - 36.5 seconds    Narrative    aPTT THERAPEUTIC RANGE  57.4 - 102.7 seconds                               CBC WITH DIFF   Result Value Ref Range    WBC 7.6 3.7 - 11.0 x10^3/uL    RBC 3.14 (L) 4.50 - 6.10 x10^6/uL    HGB 8.7 (L) 13.4 - 17.5 g/dL    HCT 29.0 (L) 38.9 - 52.0 %    MCV 92.4 78.0 - 100.0 fL    MCH 27.7 26.0 - 32.0 pg    MCHC 30.0 (L) 31.0 - 35.5 g/dL    RDW-CV 14.3 11.5 - 15.5 %    PLATELETS 205 150 - 400 x10^3/uL    MPV 9.8 8.7 - 12.5 fL    NEUTROPHIL % 71 %    LYMPHOCYTE % 13 %    MONOCYTE % 10 %    EOSINOPHIL % 4 %    BASOPHIL % 1 %    NEUTROPHIL # 5.44 1.50 - 7.70 x10^3/uL    LYMPHOCYTE # 0.95 (L) 1.00 - 4.80 x10^3/uL    MONOCYTE # 0.76 0.20 - 1.10 x10^3/uL    EOSINOPHIL # 0.30 <=0.50 x10^3/uL    BASOPHIL # <0.10 <=0.20 x10^3/uL    IMMATURE GRANULOCYTE % 1 0 - 1 %    IMMATURE GRANULOCYTE # <0.10 <0.10 x10^3/uL   CBC   Result Value Ref Range    WBC 7.6 3.7 - 11.0 x10^3/uL    RBC 2.88 (L) 4.50 - 6.10 x10^6/uL    HGB 8.0 (L) 13.4 - 17.5 g/dL    HCT 26.5 (L) 38.9 - 52.0 %    MCV 92.0 78.0 -  100.0 fL    MCH 27.8 26.0 - 32.0 pg    MCHC 30.2 (L) 31.0 - 35.5 g/dL    RDW-CV 14.2 11.5 - 15.5 %    PLATELETS 196 150 - 400 x10^3/uL    MPV 10.4 8.7 - 12.5 fL   PTT (PARTIAL THROMBOPLASTIN TIME)   Result Value Ref Range    APTT 40.3 (H) 25.1 - 36.5 seconds    Narrative    aPTT THERAPEUTIC RANGE  57.4 - 102.7 seconds                               CBC   Result Value Ref Range    WBC 5.8 3.7 - 11.0 x10^3/uL    RBC 2.73 (L) 4.50 - 6.10 x10^6/uL    HGB 7.6 (L) 13.4 - 17.5 g/dL    HCT 25.3 (L) 38.9 - 52.0 %  MCV 92.7 78.0 - 100.0 fL    MCH 27.8 26.0 - 32.0 pg    MCHC 30.0 (L) 31.0 - 35.5 g/dL    RDW-CV 14.2 11.5 - 15.5 %    PLATELETS 173 150 - 400 x10^3/uL    MPV 10.7 8.7 - 12.5 fL   PTT (PARTIAL THROMBOPLASTIN TIME)   Result Value Ref Range    APTT 60.8 (H) 25.1 - 36.5 seconds    Narrative    aPTT THERAPEUTIC RANGE  57.4 - 102.7 seconds                               EXTRA TUBES - STJ    Narrative    The following orders were created for panel order EXTRA TUBES - STJ.  Procedure                               Abnormality         Status                     ---------                               -----------         ------                     LIGHT GREEN TOP TUBE[[id number]]                             In process                   Please view results for these tests on the individual orders.         Assessment and Plan:  DVT (deep venous thrombosis) (CMS HCC)  Patient Active Problem List   Diagnosis   . Hyperlipidemia   . Epilepsy (CMS [place])   . Congenital anomaly of kidney   . Mass of esophagus   . Hypothyroidism   . S/P AVR   . Essential hypertension   . History of stress test   . H/O echocardiogram   . Acute GI bleeding   . DVT (deep venous thrombosis) (CMS HCC)   . Pulmonary embolism (CMS [facility])     - Transfer patient to [facility] once bed available.  His hemoglobin has decreased to 7.6 since admission, will repeat with next PTT and transfuse as indicated.    Timmie Foerster, DO

## 2017-12-15 NOTE — Care Plan (Signed)
Problem: Adult Inpatient Plan of Care  Goal: Plan of Care Review  Outcome: Ongoing (see interventions/notes)  Flowsheets (Taken 12/14/2017 2117)  Plan of Care Reviewed With: patient;spouse  Goal: Patient-Specific Goal (Individualization)  Outcome: Ongoing (see interventions/notes)  Flowsheets (Taken 12/15/2017 0318)  Individualized Care Needs: Monitor for bleeding  Anxieties, Fears or Concerns: current diagnosis  Patient-Specific Goals (Include Timeframe): No further acute findings during hospital stay.  Goal: Absence of Hospital-Acquired Illness or Injury  Outcome: Ongoing (see interventions/notes)  Intervention: Prevent VTE (venous thromboembolism)  Flowsheets (Taken 12/15/2017 0318)  VTE Prevention/Management: anticoagulant therapy maintained  Goal: Optimal Comfort and Wellbeing  Outcome: Ongoing (see interventions/notes)  Intervention: Provide Person-Centered Care  Flowsheets (Taken 12/15/2017 0318)  Trust Relationship/Rapport: care explained; choices provided; questions answered; questions encouraged; thoughts/feelings acknowledged  Goal: Rounds/Family Conference  Outcome: Ongoing (see interventions/notes)     Problem: Venous Thromboembolism  Goal: VTE (Venous Thromboembolism) Symptom Resolution  Outcome: Ongoing (see interventions/notes)  Intervention: Prevent or Manage VTE (Venous Thromboembolism)  Flowsheets  Taken 12/14/2017 2103  Bleeding Precautions: monitored for signs of bleeding  Taken 12/15/2017 0318  VTE Prevention/Management: anticoagulant therapy maintained     Problem: Fall Injury Risk  Goal: Absence of Fall and Fall-Related Injury  Outcome: Ongoing (see interventions/notes)  Intervention: Identify and Manage Contributors to Fall Injury Risk  Flowsheets (Taken 12/14/2017 2103)  Medication Review/Management: medications reviewed  Intervention: [place] (Taken 12/14/2017 2103)  Safety Promotion/Fall Prevention: fall prevention program maintained;safety  round/check completed;nonskid shoes/slippers when out of bed  Environmental Safety Modification: assistive device/personal items within reach;clutter free environment maintained  Plan of care and medications discussed with patient and spouse.  Oriented to room and unit.  Oriented to call bell system.  Physical assessment completed.  Fall risk interventions maintained.  Up to bathroom with nonskid shoes/slippers.  Gait steady.  No acute distress at present.   Will continue to observe.  Call bell in reach.

## 2017-12-15 NOTE — Nurses Notes (Signed)
Am assessment completed, see flowsheet. Patient supine in bed, talking to wife at this time. Patient voiced concerns regarding care plan, Dr. Owens Shark aware. Pain assessed. Left leg elevated on pillow. Edema present to left leg. Call bell encouraged when ambulating. Urinal provided. Call bell and personal items within reach.

## 2017-12-15 NOTE — Nurses Notes (Signed)
Patient is a 57 year old Male admitted from [facility] via stretcher. See flowsheet for VSs and assessment. Patient oriented to bed and room, instructed to call for any needs or concerns. Call bell within reach.

## 2017-12-15 NOTE — Consults (Signed)
[facility]  Digestive Diseases Consult      Mays, Paino, 57 y.o. male  Encounter Start Date:  12/15/2017  Inpatient Admission Date:  12/15/2017  Date of service: 12/15/2017  Date of Birth:  [birth year]    Hospital Day:  LOS: 0 days     Service: Hospitalist   Requesting MD: Dr. 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    Concern for Upper GI Bleed (One Episode of Melena)  - Broad differential includes: Previously seen GEJ mass per outside endoscopist in 2017 vs PUD vs gastritis vs esophagitis vs AVM vs mallory weiss tear vs others  - Clear liquid diet in interim. Make NPO if shows signs of clinical GI bleed.  - Hold anticoagulation and antiplatelets if possible (Currently on heparin drip for bilateral PE)  - Obtain 2 large bore peripheral IV's.  - Type and Crossmatch.  - Fluid resuscitation as needed for hemodynamic support.  - Transfuse PRBC's for Hgb <7, keep INR <1.5 and platelets >50K.  - Initiate PPI, if develops signs of GI bleed during admission transition to IV PPI BID  - Trend HH, if continues to trend down or shows signs of active GI bleed during admission will consider endoscopy (likely EGD)  - Please obtain previous EGD and Colonoscopy reports if available.  - Please call GI fellow on call if patient has an acute GI bleed or develops hemodynamic instability.    HPI/Discussion:  57 year old male with a notable history of a diverticular stricture s/p sigmoid colectomy whom gastroenterology was consulted for further evaluation of melena.    At this time, the patient states he had one isolated episode of melena around 1300 on 12/15/2017. No associated dizziness or light-headedness. No associated abdominal pain. No  fever/chills. No nausea/vomiting. No hematochezia. No odynophagia or dysphagia. Patient denies NSAID use as outpatient. Denies recent use of iron supplementation or use of Pepto-Bismal. No family history of gastrointestinal malignancies. Prior abdominal surgery - sigmoid colectomy.    Of note, patient was previously seen by gastroenterology on 11/24/2017 for concerns of hematochezia. Patient underwent a colonoscopy on 11/24/2017 with the following findings: mild diverticulosis, old blood throughout the colon, normal appearing rectosigmoid anastomotic site.    Per scanned documents, patient underwent an EGD on 12/17/2015 and per outside endoscopist interpretation, "mass measuring about 2 cm found at the GEJ on the gastric side and extending to 37 cm from the incisors and large hiatal hernia".     Past Medical History:   Diagnosis Date   . Anxiety    . Arthropathy    . BPH (benign prostatic hyperplasia)    . Congenital anomaly of kidney     HORSESHOE KIDNEY   . Depression    . Diarrhea     CDIFF   . Epilepsy (CMS [place])     OVER 10 YEARS SINCE LAST SEIZURE-DR WIEMER   . Esophageal reflux    . GIB (gastrointestinal bleeding) 2017   . Headache    . High blood pressure    . Hx of aortic valve replacement    . Hypercholesterolemia    . Hyperlipidemia    . Hypothyroidism    . Mass of esophagus    . Nodular  prostate    . Osteoarthritis cervical spine    . Palpitations    . Pneumonia    . Testicular hypofunction    . Wears glasses      Past Surgical History:   Procedure Laterality Date   . COLONOSCOPY     . HX AORTIC VALVE REPLACEMENT  2009   . HX COLECTOMY  2016    FOR OBSTRUCTION   . HX HERNIA REPAIR     . HX TURP     . HX UPPER ENDOSCOPY  04/05/2016   . INCISIONAL HERNIA REPAIR     . VENTRAL HERNIA REPAIR       Medications Prior to Admission     Prescriptions    amitriptyline (ELAVIL) 10 mg Oral Tablet    Take 10 mg by mouth Every night    calcium citrate-vitamin D3 (CITRACAL) 200 mg calcium -250 unit Oral Tablet    Take by  mouth Once a day    docusate sodium (COLACE) 100 mg Oral Capsule    Take 100 mg by mouth Twice daily    ergocalciferol, vitamin D2, (VITAMIN D2 ORAL)    Take 1 Tab by mouth Once a day    fluticasone propionate (FLONASE) 50 mcg/actuation Nasal Spray, Suspension    2 Sprays by Each Nostril route Once a day    heparin sod,pork in 0.45% NaCl (HEPARIN IN 0.45% NS) 25,000 unit/250 mL Intravenous Parenteral Solution    1,092 Units/hr by Intravenous route continuous    levETIRAcetam (KEPPRA) 250 mg Oral Tablet    Take 1 Tab (250 mg total) by mouth Twice daily for 30 days    levothyroxine (SYNTHROID) 100 mcg Oral Tablet    Take 100 mcg by mouth Every morning    melatonin 5 mg Oral Tablet    Take 10 mg by mouth Every night     multivitamin Oral Tablet    Take 1 Tab by mouth Once a day    OM-3-DHA-EPA-Fish Oil-Vit D3 (FISH OIL-VIT D3) 300-1,000-1,000 mg-mg-unit Oral Capsule    Take 3 Caps by mouth Once a day Dosage 2000, mg fish oil/1000 iu Vitamin d3     pantoprazole (PROTONIX) 40 mg Oral Tablet, Delayed Release (E.C.)    Take 40 mg by mouth Once a day     polyethylene glycol (MIRALAX) 17 gram/dose Oral Powder    Take 17 g by mouth Once a day    psyllium (METAMUCIL) Packet    Take 1 Packet by mouth Twice daily    tamsulosin (FLOMAX) 0.4 mg Oral Capsule    Take 0.4 mg by mouth Every evening after dinner          Current Facility-Administered Medications:  acetaminophen (TYLENOL) tablet 650 mg Oral Q4H PRN   albuterol (PROVENTIL) 2.5 mg / 3 mL (0.083%) neb solution 2.5 mg Nebulization Q4H PRN   amitriptyline (ELAVIL) tablet 10 mg Oral NIGHTLY   calcium citrate + vitamin D (CITRACAL) 200mg  (elemental Ca)-200 unit tab 1 Tab Oral Daily   cholecalciferol (VITAMIN D3) 1000 unit (25 mcg) tablet 1,000 Units Oral Daily   docusate sodium (COLACE) capsule 100 mg Oral 2x/day   fluticasone (FLONASE) 50 mcg per spray nasal spray 2 Spray Each Nostril Daily   heparin 25,000 units in D5W 250 mL infusion 18 Units/kg/hr (Adjusted) Intravenous  Continuous   HYDROcodone-acetaminophen (NORCO) 5-325 mg per tablet 1 Tab Oral Q4H PRN   levETIRAcetam (KEPPRA) tablet 250 mg Oral 2x/day   [START ON 12/16/2017] levothyroxine (SYNTHROID) tablet 100  mcg Oral QAM   magnesium hydroxide (MILK OF MAGNESIA) 400mg  per 6mL oral liquid 15 mL Oral 4x/day PRN   multivitamin tablet 1 Tab Oral Daily   NS flush syringe 2 mL Intracatheter Q8HRS   And      NS flush syringe 2-6 mL Intracatheter Q1 MIN PRN   ondansetron (ZOFRAN) 2 mg/mL injection 4 mg Intravenous Q6H PRN   [START ON 12/16/2017] pantoprazole (PROTONIX) delayed release tablet 40 mg Oral Daily   psyllium (METAMUCIL) oral powder 1 Packet Oral 2x/day   tamsulosin (FLOMAX) capsule 0.4 mg Oral Daily after Dinner     Allergies   Allergen Reactions   . Statins-Hmg-Coa Reductase Inhibitors Nausea/ Vomiting     Family History  Family Medical History:     Problem Relation (Age of Onset)    Blood Clots Paternal Grandmother    Coronary Artery Disease Mother, Father    Epilepsy Sister    Heart Attack Father    High Cholesterol Mother, Father    Hypertension (High Blood Pressure) Sister, Sister    Stroke Mother    Thyroid Disease Mother        Social History  Social History     Tobacco Use   . Smoking status: Never Smoker   . Smokeless tobacco: Never Used   Substance Use Topics   . Alcohol use: Yes     Alcohol/week: 1.0 standard drinks     Types: 1 Glasses of wine per week     Binge frequency: Less than monthly      ROS:   Constitutional: Negative for fevers, chills   Eyes: Negative for visual disturbance  ENT: Negative for hearing loss, tinnitus  Respiratory: Negative for cough, sputum  Cardiovascular: Negative for chest pain and palpitations  Gastrointestinal: Negative for nausea and diarrhea, positive for an episode of melena  Genitourinary: Negative for frequency, dysuria   Musculoskeletal: Negative for arthralgias  Neurological: Negative for headaches, dizziness and vertigo  Behavioral/Psych: negative for bad mood    EXAM: I  have reviewed vital signs.  Temperature: 36.8 C (98.3 F)  Heart Rate: 84  BP (Non-Invasive): (!) 153/69  Respiratory Rate: 14  SpO2: 98 %  Pain Score (Numeric, Faces): 8  General: in no acute distress.  HEENT: pupils equal, round, and reactive to light. No scleral icterus.   Respiratory: lungs clear to ausculation bilaterally  Cardiovascular: S1S2+, regular rate and rhythm.   Abdomen: soft, nontender, nondistended, no rebound or guarding  Extremities: no lower extremity edema  Skin: no spider angiomata noted, no rashes or skin lesions.  Neuro: alert and oriented to time, place, and person.   Psych: normal mood and affect    Labs: I have reviewed laboratory studies.    Imaging Studies: I have reviewed radiologic studies.    Behdod Sherren Mocha) Delman Cheadle, MD  Pager [phone]  PGY-4  GI Fellow  [facility]    =====================================================  12/16/2017    I have seen and examined the patient.  I reviewed the fellow's note.  I agree with the findings and plan of care as documented in the fellow's note.  Any exceptions/additions are edited/noted.    No sign of GIB, can have him f/u as outpatient if he develops any sxs of GIB    Sharyn Creamer, MD, [facility]   12/16/2017, 15:56  Associate Professor of Medicine  Section of [place] / [place]

## 2017-12-15 NOTE — Nurses Notes (Addendum)
Page sent to Med Hospitalist  4     9SE - 06 [place]    Patient states he takes melatonin 10mg  at night for sleep. Are we able to have an order? Thanks!    Natiya Seelinger [phone]    Order obtained

## 2017-12-15 NOTE — Ancillary Notes (Signed)
Received t/c from [place] at [facility].  They had received a referral for pt when he was d/c from [facility], but had not been able to admit pt yet. Pt's wife had been contacted and had asked for pt to see PCP prior to home health admission.  They had planned to admit pt today, but received a call from wife that pt was admitted to [facility].  Please call [facility] at [phone] when pt is d/c so they can admit pt.

## 2017-12-15 NOTE — Nurses Notes (Signed)
Patient awake and restless during safety rounds.  Complained of left leg pain stating, "It catches when I move or turn."  Pain rated 6/10 on pain scale.  Complained of insomnia with request for Melatonin 10 mg which is taken at home every night.  Telephone call to Dr. Owens Shark with orders received and noted for Tylenol 650 mg po Q4hr prn pain and Melatonin 10 mg po.  Medicated patient per orders.  No further complaints voiced.  Resting quietly on evaluation.  Call bell in reach.  Appears to be asleep.

## 2017-12-15 NOTE — Nurses Notes (Signed)
Report called and given to Alexus Gaughan RN at [facility] at this time.

## 2017-12-15 NOTE — H&P (Signed)
[facility]  General Medicine  Admission H&P    Date of Service:  12/15/2017  Richard [name], Richard [name], 57 y.o. male  Date of Admission:  12/15/2017  Date of Birth:  09-Sep-1960  PCP: Gavin Potters, APRN    LAY CAREGIVER   Appointed Lay Caregiver?: I Decline     Information Obtained from: patient  Chief Complaint:  LLE pain/DVT    HPI: Richard [name] is a 57 y.o., White male who presents in transfer from [place] with LLE pain/DVT. Patient states he began having pain ("Charlie horse") in his left leg approximately 1 week ago. It gradually worsened. He is having some mild numbness/tingling in his left toes. He went to see his PCP about this and was sent for "a scan" per patient. Patient describes an extensive DVT of the LLE to me and states a CT chest w/contrast was ordered as well which showed bilateral PE. He went to [facility] at this point. He denies fevers, chills, chest pain, abd pain, nausea, vomiting, and diarrhea. Patient's H&H is slowly trending down, so patient was transferred to [facility] prior to initiating anticoagulation. He was recently discharged from our facility on 12/01/17 which he was hospitalized for symptomatic anemia from diverticular bleeding. He admits to one black stool today. He states his stool has been fluctuating from light brown to dark brown, but today it was actually black. No BRBPR.    PAST MEDICAL:    Past Medical History:   Diagnosis Date   . Anxiety    . Arthropathy    . BPH (benign prostatic hyperplasia)    . Congenital anomaly of kidney     HORSESHOE KIDNEY   . Depression    . Diarrhea     CDIFF   . Epilepsy (CMS [place])     OVER 10 YEARS SINCE LAST SEIZURE-DR [facility]   . Esophageal reflux    . GIB (gastrointestinal bleeding) 2017   . Headache    . High blood pressure    . Hx of aortic valve replacement    . Hypercholesterolemia    . Hyperlipidemia    . Hypothyroidism    . Mass of esophagus    . Nodular prostate    . Osteoarthritis cervical spine    . Palpitations       . Pneumonia    . Testicular hypofunction    . Wears glasses         Past Surgical History:   Procedure Laterality Date   . COLONOSCOPY     . HX AORTIC VALVE REPLACEMENT  2009   . HX COLECTOMY  2016    FOR OBSTRUCTION   . HX HERNIA REPAIR     . HX TURP     . HX UPPER ENDOSCOPY  04/05/2016   . INCISIONAL HERNIA REPAIR     . VENTRAL HERNIA REPAIR              Medications Prior to Admission     Prescriptions    amitriptyline (ELAVIL) 10 mg Oral Tablet    Take 10 mg by mouth Every night    calcium citrate-vitamin D3 (CITRACAL) 200 mg calcium -250 unit Oral Tablet    Take by mouth Once a day    docusate sodium (COLACE) 100 mg Oral Capsule    Take 100 mg by mouth Twice daily    ergocalciferol, vitamin D2, (VITAMIN D2 ORAL)    Take 1 Tab by mouth Once a day  fluticasone propionate (FLONASE) 50 mcg/actuation Nasal Spray, Suspension    2 Sprays by Each Nostril route Once a day    heparin sod,pork in 0.45% NaCl (HEPARIN IN 0.45% NS) 25,000 unit/250 mL Intravenous Parenteral Solution    1,092 Units/hr by Intravenous route continuous    levETIRAcetam (KEPPRA) 250 mg Oral Tablet    Take 1 Tab (250 mg total) by mouth Twice daily for 30 days    levothyroxine (SYNTHROID) 100 mcg Oral Tablet    Take 100 mcg by mouth Every morning    melatonin 5 mg Oral Tablet    Take 10 mg by mouth Every night     multivitamin Oral Tablet    Take 1 Tab by mouth Once a day    OM-3-DHA-EPA-Fish Oil-Vit D3 (FISH OIL-VIT D3) 300-1,000-1,000 mg-mg-unit Oral Capsule    Take 3 Caps by mouth Once a day Dosage 2000, mg fish oil/1000 iu Vitamin d3     pantoprazole (PROTONIX) 40 mg Oral Tablet, Delayed Release (E.C.)    Take 40 mg by mouth Once a day     polyethylene glycol (MIRALAX) 17 gram/dose Oral Powder    Take 17 g by mouth Once a day    psyllium (METAMUCIL) Packet    Take 1 Packet by mouth Twice daily    tamsulosin (FLOMAX) 0.4 mg Oral Capsule    Take 0.4 mg by mouth Every evening after dinner        Allergies   Allergen Reactions   . Statins-Hmg-Coa  Reductase Inhibitors Nausea/ Vomiting         Family History  Family Medical History:     Problem Relation (Age of Onset)    Blood Clots Paternal Grandmother    Coronary Artery Disease Mother, Father    Epilepsy Sister    Heart Attack Father    High Cholesterol Mother, Father    Hypertension (High Blood Pressure) Sister, Sister    Stroke Mother    Thyroid Disease Mother          Social History  Social History     Tobacco Use   . Smoking status: Never Smoker   . Smokeless tobacco: Never Used   Substance Use Topics   . Alcohol use: Yes     Alcohol/week: 1.0 standard drinks     Types: 1 Glasses of wine per week     Binge frequency: Less than monthly        ROS: Other than ROS in the HPI, all other systems were negative.    Examination:  Temperature: 36.8 C (98.3 F) Heart Rate: 84 BP (Non-Invasive): (!) 153/69   Respiratory Rate: 14 SpO2: 98 % Pain Score (Numeric, Faces): 8   GENERAL: Pleasant, No acute distress  HEENT: Normocephalic, atraumatic, EOMI  NECK: Supple, trachea midline  PULM: CTA B/L, no wheezes, rales, or rhonchi  CV: RRR, systolic murmur  GI: abd soft, mild tenderness to palpation in LUQ and LLQ, nondistended, normoactive BS  EXTREMITIES: trace edema in distal LLE, pulses intact  NEURO: Cranial nerves 2-12 grossly intact, no focal deficits  PSYCH: Alert and oriented x 3    Labs:    I have reviewed all lab results.  Lab Results Today:    Results for orders placed or performed during the hospital encounter of 12/15/17 (from the past 24 hour(s))   CBC   Result Value Ref Range    WBC 6.9 3.7 - 11.0 x10^3/uL    RBC 2.83 (L) 4.50 - 6.10 x10^6/uL  HGB 7.8 (L) 13.4 - 17.5 g/dL    HCT 25.4 (L) 38.9 - 52.0 %    MCV 89.8 78.0 - 100.0 fL    MCH 27.6 26.0 - 32.0 pg    MCHC 30.7 (L) 31.0 - 35.5 g/dL    RDW-CV 14.2 11.5 - 15.5 %    PLATELETS 169 150 - 400 x10^3/uL    MPV 10.5 8.7 - 12.5 fL   PTT (PARTIAL THROMBOPLASTIN TIME)   Result Value Ref Range    APTT 49.8 (H) 24.1 - 38.5 seconds   PT/INR   Result Value Ref  Range    PROTHROMBIN TIME 13.8 9.5 - 14.1 seconds    INR 1.17 0.80 - 7.67   BASIC METABOLIC PANEL   Result Value Ref Range    SODIUM 140 136 - 145 mmol/L    POTASSIUM 4.3 3.5 - 5.1 mmol/L    CHLORIDE 106 96 - 111 mmol/L    CO2 TOTAL 25 22 - 32 mmol/L    ANION GAP 9 4 - 13 mmol/L    CALCIUM 8.5 8.5 - 10.2 mg/dL    GLUCOSE 115 65 - 139 mg/dL    BUN 17 8 - 25 mg/dL    CREATININE 1.32 (H) 0.62 - 1.27 mg/dL    BUN/CREA RATIO 13 6 - 22    ESTIMATED GFR 56 (L) >60 mL/min/1.21m^2   POC BLOOD GLUCOSE (RESULTS)   Result Value Ref Range    GLUCOSE, POC 130 (H) 70 - 105 mg/dl   Results for orders placed or performed during the hospital encounter of 12/14/17 (from the past 24 hour(s))   CBC   Result Value Ref Range    WBC 7.6 3.7 - 11.0 x10^3/uL    RBC 2.88 (L) 4.50 - 6.10 x10^6/uL    HGB 8.0 (L) 13.4 - 17.5 g/dL    HCT 26.5 (L) 38.9 - 52.0 %    MCV 92.0 78.0 - 100.0 fL    MCH 27.8 26.0 - 32.0 pg    MCHC 30.2 (L) 31.0 - 35.5 g/dL    RDW-CV 14.2 11.5 - 15.5 %    PLATELETS 196 150 - 400 x10^3/uL    MPV 10.4 8.7 - 12.5 fL   PTT (PARTIAL THROMBOPLASTIN TIME)   Result Value Ref Range    APTT 40.3 (H) 25.1 - 36.5 seconds   CBC   Result Value Ref Range    WBC 5.8 3.7 - 11.0 x10^3/uL    RBC 2.73 (L) 4.50 - 6.10 x10^6/uL    HGB 7.6 (L) 13.4 - 17.5 g/dL    HCT 25.3 (L) 38.9 - 52.0 %    MCV 92.7 78.0 - 100.0 fL    MCH 27.8 26.0 - 32.0 pg    MCHC 30.0 (L) 31.0 - 35.5 g/dL    RDW-CV 14.2 11.5 - 15.5 %    PLATELETS 173 150 - 400 x10^3/uL    MPV 10.7 8.7 - 12.5 fL   PTT (PARTIAL THROMBOPLASTIN TIME)   Result Value Ref Range    APTT 60.8 (H) 25.1 - 36.5 seconds   LIGHT GREEN TOP TUBE   Result Value Ref Range    RAINBOW/EXTRA TUBE AUTO RESULT Yes    PTT (PARTIAL THROMBOPLASTIN TIME)   Result Value Ref Range    APTT 53.7 (H) 25.1 - 36.5 seconds   H & H   Result Value Ref Range    HGB 8.1 (L) 13.4 - 17.5 g/dL    HCT 26.4 (L) 38.9 - 52.0 %  Imaging Studies:  No new imaging at our facility    DNR Status:  Full Code    Assessment/Plan:   Active  Hospital Problems    Diagnosis   . Bilateral pulmonary embolism (CMS HCC)     1) Bilateral PE/LLE DVT  - Start heparin gtt  - Patient prefers DOAC as opposed to Coumadin upon discharge if possible    2) Possible GIB/Normocytic anemia  - H&H low, will trend Q6h in light of melena and recent GIB  - FOBT ordered  - Consulted GI    3) AKI  - will add gentle IV hydration  - minimize nephrotoxins    4) HLD  - diet controlled  - does not take statin 2/2 nausea/vomiting    5) Epilepsy  - continue home Keppra    6) Essential HTN  - does not appear to take home meds for this  - will treat with hydralazine prn    7) hypothyroidism  - continue Synthroid    8) BPH  - continue Flomax      DVT/PE Prophylaxis: Heparin    Girard Cooter, DO    12/15/2017      HOSPITALISTCPT: H+P Inpatient Initial  Level 3 (72158)

## 2017-12-16 LAB — H & H
HCT: 25.3 % — ABNORMAL LOW (ref 38.9–52.0)
HCT: 25.6 % — ABNORMAL LOW (ref 38.9–52.0)
HCT: 26 % — ABNORMAL LOW (ref 38.9–52.0)
HGB: 7.7 g/dL — ABNORMAL LOW (ref 13.4–17.5)
HGB: 7.7 g/dL — ABNORMAL LOW (ref 13.4–17.5)
HGB: 7.9 g/dL — ABNORMAL LOW (ref 13.4–17.5)

## 2017-12-16 LAB — BASIC METABOLIC PANEL
ANION GAP: 7 mmol/L (ref 4–13)
BUN/CREA RATIO: 12 (ref 6–22)
BUN: 14 mg/dL (ref 8–25)
CALCIUM: 8.6 mg/dL (ref 8.5–10.2)
CHLORIDE: 107 mmol/L (ref 96–111)
CO2 TOTAL: 23 mmol/L (ref 22–32)
CREATININE: 1.15 mg/dL (ref 0.62–1.27)
ESTIMATED GFR: >60 mL/min/{1.73_m2} (ref >60)
GLUCOSE: 96 mg/dL (ref 65–139)
POTASSIUM: 4.5 mmol/L (ref 3.5–5.1)
SODIUM: 137 mmol/L (ref 136–145)

## 2017-12-16 LAB — PTT (PARTIAL THROMBOPLASTIN TIME)
APTT: 123.3 s (ref 24.1–38.5)
APTT: 59.4 s — ABNORMAL HIGH (ref 24.1–38.5)
APTT: 62.1 s — ABNORMAL HIGH (ref 24.1–38.5)

## 2017-12-16 LAB — CBC WITH DIFF
BASOPHIL #: <0.1 10*3/uL (ref <=0.20)
BASOPHIL %: 1 %
EOSINOPHIL #: 0.26 10*3/uL (ref <=0.50)
EOSINOPHIL %: 5 %
HCT: 24.5 % — ABNORMAL LOW (ref 38.9–52.0)
HGB: 7.5 g/dL — ABNORMAL LOW (ref 13.4–17.5)
HGB: 7.5 g/dL — ABNORMAL LOW (ref 13.4–17.5)
IMMATURE GRANULOCYTE #: <0.1 10*3/uL (ref <0.10)
IMMATURE GRANULOCYTE %: 0 % (ref 0–1)
LYMPHOCYTE #: 1.03 10*3/uL (ref 1.00–4.80)
LYMPHOCYTE %: 18 %
MCH: 27.7 pg (ref 26.0–32.0)
MCHC: 30.6 g/dL — ABNORMAL LOW (ref 31.0–35.5)
MCV: 90.4 fL (ref 78.0–100.0)
MONOCYTE #: 0.6 10*3/uL (ref 0.20–1.10)
MONOCYTE %: 11 %
MPV: 10.3 fL (ref 8.7–12.5)
NEUTROPHIL #: 3.78 10*3/uL (ref 1.50–7.70)
NEUTROPHIL %: 65 %
PLATELETS: 168 10*3/uL (ref 150–400)
RBC: 2.71 10*6/uL — ABNORMAL LOW (ref 4.50–6.10)
RDW-CV: 14.2 % (ref 11.5–15.5)
RDW-CV: 14.2 % (ref 11.5–15.5)
WBC: 5.7 10*3/uL (ref 3.7–11.0)

## 2017-12-16 MED ORDER — APIXABAN 5 MG TABLET
10.0000 mg | ORAL_TABLET | Freq: Two times a day (BID) | ORAL | Status: DC
Start: 2017-12-16 — End: 2017-12-18
  Administered 2017-12-16 – 2017-12-18 (×4): 10 mg via ORAL
  Filled 2017-12-16 (×5): qty 2

## 2017-12-16 MED ADMIN — levETIRAcetam 250 mg tablet: ORAL | @ 09:00:00

## 2017-12-16 MED ADMIN — cephalexin 500 mg capsule: INTRAVENOUS | @ 14:00:00

## 2017-12-16 MED ADMIN — sodium chloride 0.9 % intravenous solution: ORAL | @ 09:00:00 | NDC 00338004904

## 2017-12-16 NOTE — Progress Notes (Signed)
[facility]  Medicine Progress Note  Full Code    Richard [name]  Date of service: 12/16/2017    Subjective:   No episodes of SOB  No further episodes of BPR  Denies CP or NVD    Vital Signs:  Temp (24hrs) Max:36.9 C (14.7 F)      Systolic (82NFA), OZH:086 , Min:121 , VHQ:469     Diastolic (62XBM), WUX:32, Min:63, Max:83    Temp  Avg: 36.8 C (98.2 F)  Min: 36.6 C (97.8 F)  Max: 36.9 C (98.5 F)  MAP (Non-Invasive)  Avg: 90 mmHG  Min: 81 mmHG  Max: 101 mmHG  Pulse  Avg: 80.3  Min: 76  Max: 87  Resp  Avg: 16  Min: 14  Max: 18  SpO2  Avg: 99 %  Min: 97 %  Max: 100 %  Pain Score (Numeric, Faces): 0  Fi02    I/O:  I/O last 24 hours:      Intake/Output Summary (Last 24 hours) at 12/16/2017 1749  Last data filed at 12/16/2017 1500  Gross per 24 hour   Intake 2442.07 ml   Output 2600 ml   Net -157.93 ml     I/O current shift:  10/12 0700 - 10/12 1859  In: 1902.4 [P.O.:1440; I.V.:462.4]  Out: 2050 [Urine:2050]  Blood Sugars: Last Fingerstick:  No results found for: GLUCOSEPOC      Current Facility-Administered Medications:  acetaminophen (TYLENOL) tablet 650 mg Oral Q4H PRN   albuterol (PROVENTIL) 2.5 mg / 3 mL (0.083%) neb solution 2.5 mg Nebulization Q4H PRN   amitriptyline (ELAVIL) tablet 10 mg Oral NIGHTLY   apixaban (ELIQUIS) tablet 10 mg Oral 2x/day   calcium citrate + vitamin D (CITRACAL) 200mg  (elemental Ca)-200 unit tab 1 Tab Oral Daily   cholecalciferol (VITAMIN D3) 1000 unit (25 mcg) tablet 1,000 Units Oral Daily   docusate sodium (COLACE) capsule 100 mg Oral 2x/day   fluticasone (FLONASE) 50 mcg per spray nasal spray 2 Spray Each Nostril Daily   HYDROcodone-acetaminophen (NORCO) 5-325 mg per tablet 1 Tab Oral Q4H PRN   levETIRAcetam (KEPPRA) tablet 250 mg Oral 2x/day   levothyroxine (SYNTHROID) tablet 100 mcg Oral QAM   magnesium hydroxide (MILK OF MAGNESIA) 400mg  per 61mL oral liquid 15 mL Oral 4x/day PRN   melatonin tablet 12 mg Oral HS PRN   multivitamin tablet 1 Tab Oral Daily   NS flush  syringe 2 mL Intracatheter Q8HRS   And      NS flush syringe 2-6 mL Intracatheter Q1 MIN PRN   ondansetron (ZOFRAN) 2 mg/mL injection 4 mg Intravenous Q6H PRN   pantoprazole (PROTONIX) delayed release tablet 40 mg Oral Daily   psyllium (METAMUCIL) oral powder 1 Packet Oral 2x/day   tamsulosin (FLOMAX) capsule 0.4 mg Oral Daily after Dinner       Allergies   Allergen Reactions   . Statins-Hmg-Coa Reductase Inhibitors Nausea/ Vomiting       Physical Exam:  GENERAL: Pleasant, No acute distress  HEENT: Normocephalic, atraumatic, EOMI  NECK: Supple, trachea midline  PULM: CTA B/L, no wheezes, rales, or rhonchi  CV: RRR, systolic murmur  GI: abd soft, mild tenderness to palpation in LUQ and LLQ, nondistended, normoactive BS  EXTREMITIES: trace edema in distal LLE, pulses intact  NEURO: Cranial nerves 2-12 grossly intact, no focal deficits  PSYCH: Alert and oriented x 3    [name]  Radiology  Microbiology      PT/OT: No  Consults: GI    Assessment/ Plan:   Active Hospital Problems    Diagnosis   . Bilateral pulmonary embolism (CMS HCC)     1) Bilateral PE/LLE DVT  - Started heparin gtt  -Transitioning heparin gtt to apixaban on 10/12    2) Possible GIB/Normocytic anemia  -H&H stable  -Resumed diet  -Monitor H/H    3) AKI  - will add gentle IV hydration  - minimize nephrotoxins    4) HLD  - diet controlled    5) Epilepsy  - continue home Keppra    6) Essential HTN  - does not appear to take home meds for this  - will treat with hydralazine prn    7) hypothyroidism  - continue Synthroid    8) BPH  - continue Flomax    DVT/PE Prophylaxis: SCDs/ Venodynes/Impulse boots    Disposition Planning: Home discharge      Clemens Catholic, MD

## 2017-12-16 NOTE — Nurses Notes (Addendum)
Per pharmacy we will start Eliquis at 1800 and stop heparin 2 hours before 1600.

## 2017-12-16 NOTE — Nurses Notes (Signed)
Results for Richard [name], Richard [name] (MRN [id number]) as of 12/16/2017 14:01   Ref. Range 12/16/2017 13:15   HGB Latest Ref Range: 13.4 - 17.5 g/dL 7.7 (L)   HCT Latest Ref Range: 38.9 - 52.0 % 25.3 (L)   aPTT Latest Ref Range: 24.1 - 38.5 seconds 62.1 (H)   Therapeutic PTT, per protocol maintain Heparin at 17 units/kg/hr, repeat PTT in 6 hours.  Hbg remaining about the same. Patient is asking for a regular diet, Clemens Catholic, MD text paged.

## 2017-12-16 NOTE — Nurses Notes (Signed)
Results for GRIFF, BADLEY (MRN [id number]) as of 12/16/2017 07:38   Ref. Range 12/16/2017 06:15   aPTT Latest Ref Range: 24.1 - 38.5 seconds 59.4 (H)   Per protocol Heparin increased to 17 units/kg/hr. Repeat PTT in 6 hours.

## 2017-12-16 NOTE — Care Plan (Addendum)
No acute changes throughout the night. Patient denies SOB and pain. Sats >95% on RA, VSS. NSR, HR 70s. Patient is high fall risk, x1 assist. Ambulation encouraged, sitter select and seizure precautions refused. Family remains at beside. Turns and repositions independently. Heparin gtt and continuous IV fluids maintained. Next aPTT draw is at 0700. Currently trending H&H.    Problem: Adult Inpatient Plan of Care  Goal: Plan of Care Review  Outcome: Ongoing (see interventions/notes)  Goal: Patient-Specific Goal (Individualization)  Outcome: Ongoing (see interventions/notes)  Flowsheets (Taken 12/15/2017 1800 by Liam Rogers, RN)  Individualized Care Needs: Please call me by my middle name Richard [name], was also recently admitted here for a GI bleed  Anxieties, Fears or Concerns: Concerned about having blood clots  Goal: Absence of Hospital-Acquired Illness or Injury  Outcome: Ongoing (see interventions/notes)  Goal: Optimal Comfort and Wellbeing  Outcome: Ongoing (see interventions/notes)     Problem: Venous Thromboembolism  Goal: VTE (Venous Thromboembolism) Symptom Resolution  Outcome: Ongoing (see interventions/notes)     Problem: Fall Injury Risk  Goal: Absence of Fall and Fall-Related Injury  Outcome: Ongoing (see interventions/notes)

## 2017-12-16 NOTE — Nurses Notes (Signed)
Patient wife states, "Per Medicare we are going to exercise our right to defer the discharge for 24 hours until I am sure he is good to go home."

## 2017-12-16 NOTE — Care Plan (Addendum)
Problem: Adult Inpatient Plan of Care  Goal: Plan of Care Review  Outcome: Ongoing (see interventions/notes)  Goal: Patient-Specific Goal (Individualization)  Outcome: Ongoing (see interventions/notes)  Flowsheets  Taken 12/15/2017 1800 by Liam Rogers, RN  Individualized Care Needs: Please call me by my middle name Richard [name], was also recently admitted here for a GI bleed  Taken 12/16/2017 1237 by Shyrl Obi A, RN  Anxieties, Fears or Concerns: "Is my blood count OK?"  Patient-Specific Goals (Include Timeframe): "get better" "maybe eat real food"  Goal: Absence of Hospital-Acquired Illness or Injury  Outcome: Ongoing (see interventions/notes)  Goal: Optimal Comfort and Wellbeing  Outcome: Ongoing (see interventions/notes)  Goal: Rounds/Family Conference  Outcome: Ongoing (see interventions/notes)   Monitor NSR. Denies pain of SOB.  Tolerating clear liquid diet. No abdominal pain or bloody stool. IVF NSS infusing at 72ml/hr. Heparin infusing at 17 units/kg/hour. Trending PTT's and blood counts. Wife at bedside. Diet advanced to regular. To start eliquis tonight. Probable discharge to home tomorrow.

## 2017-12-17 LAB — H & H
HCT: 25 % — ABNORMAL LOW (ref 38.9–52.0)
HGB: 7.6 g/dL — ABNORMAL LOW (ref 13.4–17.5)

## 2017-12-17 MED ADMIN — calcium citrate-vitamin D3 200 mg calcium-250 unit tablet: ORAL | @ 09:00:00

## 2017-12-17 MED ADMIN — sodium chloride 0.45 % intravenous solution: @ 06:00:00 | NDC 00338004304

## 2017-12-17 MED ADMIN — oxyCODONE 5 mg tablet: ORAL | @ 21:00:00

## 2017-12-17 MED ADMIN — sodium chloride 0.9 % intravenous solution: ORAL | @ 21:00:00 | NDC 00338004904

## 2017-12-17 NOTE — Progress Notes (Signed)
[facility]  Medicine Progress Note  Full Code    Richard [name]  Date of service: 12/17/2017    Subjective:   There are no acute events overnight  No further episodes of bleeding per rectum  Does not complain of melena  Shortness of breath has improved  Would like to stay 1 more day to ensure that he does not have bleeding in response to apixaban    Vital Signs:  Temp (24hrs) Max:36.8 C (82.9 F)      Systolic (93ZJI), RCV:893 , Min:109 , YBO:175     Diastolic (10CHE), NID:78, Min:66, Max:81    Temp  Avg: 36.7 C (98.1 F)  Min: 36.7 C (98.1 F)  Max: 36.8 C (98.2 F)  MAP (Non-Invasive)  Avg: 85.5 mmHG  Min: 79 mmHG  Max: 90 mmHG  Pulse  Avg: 76  Min: 69  Max: 86  Resp  Avg: 17.3  Min: 16  Max: 18  SpO2  Avg: 98 %  Min: 96 %  Max: 100 %  Pain Score (Numeric, Faces): 0  Fi02    I/O:  I/O last 24 hours:      Intake/Output Summary (Last 24 hours) at 12/17/2017 1943  Last data filed at 12/17/2017 1817  Gross per 24 hour   Intake 1844 ml   Output 2060 ml   Net -216 ml     I/O current shift:  No intake/output data recorded.  Blood Sugars: Last Fingerstick:  No results found for: GLUCOSEPOC      Current Facility-Administered Medications:  acetaminophen (TYLENOL) tablet 650 mg Oral Q4H PRN   albuterol (PROVENTIL) 2.5 mg / 3 mL (0.083%) neb solution 2.5 mg Nebulization Q4H PRN   amitriptyline (ELAVIL) tablet 10 mg Oral NIGHTLY   apixaban (ELIQUIS) tablet 10 mg Oral 2x/day   calcium citrate + vitamin D (CITRACAL) 200mg  (elemental Ca)-200 unit tab 1 Tab Oral Daily   cholecalciferol (VITAMIN D3) 1000 unit (25 mcg) tablet 1,000 Units Oral Daily   docusate sodium (COLACE) capsule 100 mg Oral 2x/day   fluticasone (FLONASE) 50 mcg per spray nasal spray 2 Spray Each Nostril Daily   HYDROcodone-acetaminophen (NORCO) 5-325 mg per tablet 1 Tab Oral Q4H PRN   levETIRAcetam (KEPPRA) tablet 250 mg Oral 2x/day   levothyroxine (SYNTHROID) tablet 100 mcg Oral QAM   magnesium hydroxide (MILK OF MAGNESIA) 400mg  per 14mL oral  liquid 15 mL Oral 4x/day PRN   melatonin tablet 12 mg Oral HS PRN   multivitamin tablet 1 Tab Oral Daily   NS flush syringe 2 mL Intracatheter Q8HRS   And      NS flush syringe 2-6 mL Intracatheter Q1 MIN PRN   ondansetron (ZOFRAN) 2 mg/mL injection 4 mg Intravenous Q6H PRN   pantoprazole (PROTONIX) delayed release tablet 40 mg Oral Daily   psyllium (METAMUCIL) oral powder 1 Packet Oral 2x/day   tamsulosin (FLOMAX) capsule 0.4 mg Oral Daily after Dinner       Allergies   Allergen Reactions   . Statins-Hmg-Coa Reductase Inhibitors Nausea/ Vomiting       Physical Exam:  GENERAL: Pleasant, No acute distress  HEENT: Normocephalic, atraumatic, EOMI  NECK: Supple, trachea midline  PULM: CTA B/L, no wheezes, rales, or rhonchi  CV: RRR, systolic murmur  GI: abd soft, mild tenderness to palpation in LUQ and LLQ, nondistended, normoactive BS  EXTREMITIES: trace edema in distal LLE, pulses intact  NEURO: Cranial nerves 2-12 grossly intact, no focal deficits  PSYCH: Alert and oriented x  [place]  Radiology  Microbiology      PT/OT: No    Consults: GI    Assessment/ Plan:   Active Hospital Problems    Diagnosis   . Bilateral pulmonary embolism (CMS HCC)     1) Bilateral PE/LLE DVT  - patient was started on heparin drip, transitioned to apixaban on 10/12    2) Possible GIB/Normocytic anemia  -H&H stable  -tolerating diet  -Monitor H/H    3) AKI  -serum creatinine getting better  - discontinued IV hydration  - minimize nephrotoxins  -encourage p.o. Diet    4) HLD  - diet controlled    5) Epilepsy  - continue home Keppra    6) Essential HTN  - does not appear to take home meds for this  - will treat with hydralazine prn    7) hypothyroidism  - continue Synthroid    8) BPH  - continue Flomax    DVT/PE Prophylaxis: SCDs/ Venodynes/Impulse boots    Disposition Planning: Home discharge  tomorrow     Clemens Catholic, MD

## 2017-12-17 NOTE — Care Plan (Signed)
Pt had no acute issues throughout the shift. He is a high fall risk refusing the sitter select and seizure precautions refusing the padded side rails. He is able to turn and position independently, and is standby assist with ambulation. Wife agrees to stay at bedside for fall and seizure precautions. Emotional support and continuing education has been provided. The plan is to continue [place] for seizures and possibly discharge home today on Eliquis.   Alphonzo Cruise, RN  12/17/2017, 03:42    Problem: Adult Inpatient Plan of Care  Goal: Plan of Care Review  Outcome: Ongoing (see interventions/notes)  Goal: Patient-Specific Goal (Individualization)  Outcome: Ongoing (see interventions/notes)  Goal: Absence of Hospital-Acquired Illness or Injury  Outcome: Ongoing (see interventions/notes)  Goal: Optimal Comfort and Wellbeing  Outcome: Ongoing (see interventions/notes)

## 2017-12-17 NOTE — Care Plan (Signed)
VSS.  Pt requested 1 dose of pain medicine during the day.  Pt was able to ambulate in the hallway today. Family requested to be discharged tomorrow.  Pt continuing to refuse SS and seizure precautions.    Problem: Adult Inpatient Plan of Care  Goal: Plan of Care Review  Outcome: Ongoing (see interventions/notes)     Problem: Fall Injury Risk  Goal: Absence of Fall and Fall-Related Injury  Outcome: Ongoing (see interventions/notes)     Problem: Skin Injury Risk Increased  Goal: Skin Health and Integrity  Outcome: Ongoing (see interventions/notes)

## 2017-12-18 DIAGNOSIS — D509 Iron deficiency anemia, unspecified: Secondary | ICD-10-CM

## 2017-12-18 LAB — OCCULT BLOOD, STOOL: OCCULT BLOOD: NEGATIVE

## 2017-12-18 LAB — BASIC METABOLIC PANEL
ANION GAP: 7 mmol/L (ref 4–13)
BUN/CREA RATIO: 9 (ref 6–22)
BUN: 11 mg/dL (ref 8–25)
CALCIUM: 8.8 mg/dL (ref 8.5–10.2)
CHLORIDE: 105 mmol/L (ref 96–111)
CO2 TOTAL: 25 mmol/L (ref 22–32)
CREATININE: 1.26 mg/dL (ref 0.62–1.27)
ESTIMATED GFR: 59 mL/min/1.73mˆ2 — ABNORMAL LOW (ref >60)
GLUCOSE: 90 mg/dL (ref 65–139)
POTASSIUM: 4.5 mmol/L (ref 3.5–5.1)
SODIUM: 137 mmol/L (ref 136–145)

## 2017-12-18 LAB — CBC
HCT: 26.2 % — ABNORMAL LOW (ref 38.9–52.0)
HCT: 26.2 % — ABNORMAL LOW (ref 38.9–52.0)
HGB: 8 g/dL — ABNORMAL LOW (ref 13.4–17.5)
MCH: 27.5 pg (ref 26.0–32.0)
MCHC: 30.5 g/dL — ABNORMAL LOW (ref 31.0–35.5)
MCV: 90 fL (ref 78.0–100.0)
MPV: 10.8 fL (ref 8.7–12.5)
PLATELETS: 173 x10ˆ3/uL (ref 150–400)
RBC: 2.91 10*6/uL — ABNORMAL LOW (ref 4.50–6.10)
RBC: 2.91 10*6/uL — ABNORMAL LOW (ref 4.50–6.10)
RDW-CV: 14.1 % (ref 11.5–15.5)
WBC: 5.4 10*3/uL (ref 3.7–11.0)
WBC: 5.4 x10ˆ3/uL (ref 3.7–11.0)

## 2017-12-18 MED ORDER — FLU VACCINE QS 2019-20(6MOS UP)(PF) 60 MCG(15 MCGX4)/0.5 ML IM SYRINGE
0.5000 mL | INJECTION | Freq: Once | INTRAMUSCULAR | Status: AC
Start: 2017-12-18 — End: 2017-12-18
  Administered 2017-12-18: 0.5 mL via INTRAMUSCULAR
  Filled 2017-12-18: qty 0.5

## 2017-12-18 MED ORDER — APIXABAN 5 MG TABLET
ORAL_TABLET | ORAL | 2 refills | Status: DC
Start: 2017-12-18 — End: 2018-04-12

## 2017-12-18 MED ADMIN — sodium chloride 0.9 % intravenous solution: ORAL | @ 08:00:00 | NDC 00338004904

## 2017-12-18 NOTE — Care Management Notes (Signed)
[facility] Management Note    Patient Name: Richard [name]  Date of Birth: [birth year]  Sex: male  Date/Time of Admission: 12/15/2017  5:53 PM  Room/Bed: 06/A  Payor: [place] MEDICARE / Plan: [place] MEDICARE ADVANTAGE PPO / Product Type: PPO /    LOS: 3 days   Primary Care Providers:  Gavin Potters, APRN, APRN (General)    Admitting Diagnosis:  Bilateral pulmonary embolism (CMS [facility]) [I26.99]    Task sent to CM assistant to check benefits/copay amount and obtain prior auth if required for Eliquis 10mg  PO BID x 7 days and then 5mg  PO BID x 3 months.  Per CM assistant benefit check-"no prior Josem Kaufmann is required, copay amount will be determined by patient's pharmacy".  Coupon card for free 30 day trial available. Service aware and will verify copay amount from patient's pharmacy.     The patient will continue to be evaluated for developing discharge needs.     Case Manager: Liam Graham, [place] COORDINATOR  Phone: [phone]

## 2017-12-18 NOTE — Care Plan (Signed)
Met with patient and wife for initial assessment. Patient lives in a one story home with his wife. Pt states he is independent in self care and medications. Wife will provide transportation at discharge. Pt follows with Gavin Potters APRN and was seen 3 days ago. Pt does not have MPOA, declines lay caregiver. Pt is active with [facility] nursing/PT/OT. Pt has no DME at home. Pt uses [facility] and states he has prescription coverage but copays are sometimes difficult to afford. [facility] gave patient card for free 30 day trial for Eliquis and wife will follow with their pharmacy for any other discount programs available. Patient is for discharge today. Discharge IMM explained to patient, patient signed form, declined copy,signed copy placed in CM office.

## 2017-12-18 NOTE — Care Plan (Signed)
Patient given discharge instructions as directed by MD and handouts on all new medications. Patient and family member verbalize understanding of all discharge instructions and follow up appointments. No questions at this time from patient. Will continue with discharge. Patient stable at time of discharge.  Pt given pain medicine prior to d/c for c/o pain in his leg.  Pt awaiting transport.    Problem: Adult Inpatient Plan of Care  Goal: Plan of Care Review  Outcome: Ongoing (see interventions/notes)  Goal: Patient-Specific Goal (Individualization)  Outcome: Ongoing (see interventions/notes)  Goal: Absence of Hospital-Acquired Illness or Injury  Outcome: Ongoing (see interventions/notes)  Goal: Optimal Comfort and Wellbeing  Outcome: Ongoing (see interventions/notes)  Goal: Rounds/Family Conference  Outcome: Ongoing (see interventions/notes)     Problem: Venous Thromboembolism  Goal: VTE (Venous Thromboembolism) Symptom Resolution  Outcome: Ongoing (see interventions/notes)     Problem: Fall Injury Risk  Goal: Absence of Fall and Fall-Related Injury  Outcome: Ongoing (see interventions/notes)     Problem: Skin Injury Risk Increased  Goal: Skin Health and Integrity  Outcome: Ongoing (see interventions/notes)

## 2017-12-18 NOTE — Care Management Notes (Signed)
[place] Management Initial Evaluation    Patient Name: Richard [name]  Date of Birth: 19-Nov-1960  Sex: male  Date/Time of Admission: 12/15/2017  5:53 PM  Room/Bed: 06/A  Payor: [place] MEDICARE / Plan: [place] MEDICARE ADVANTAGE PPO / Product Type: PPO /   Primary Care Providers:  Gavin Potters, APRN, APRN (General)    Pharmacy Info:   Preferred [place], [place].    [address] [place] [address]    Phone: [phone] Fax: [phone]    Not a 24 hour pharmacy; exact hours not known.    [place] AT [place]    [place] [facility] [address]    Phone: [phone] Fax: [phone]    Not a 24 hour pharmacy; exact hours not known.        Emergency Contact Info:   Extended Emergency Contact Information  Primary Emergency Contact: Wallace, Jasper Phone: [phone]  Relation: Wife  Preferred language: English    History:   Richard [name] is a 57 y.o., male, admitted with bilateral pulmonary embolism and LLL DVT    Height/Weight: 175.3 cm ('5\' 9"'$ ) / 87.8 kg (193 lb 9 oz)     LOS: 3 days   Admitting Diagnosis: Bilateral pulmonary embolism (CMS [facility]) [I26.99]    Assessment:      12/18/17 1319   Assessment Details   Assessment Type Admission   Date of Care Management Update 12/18/17   Date of Next DCP Update 12/21/17   Readmission   Is this a readmission? Yes   Number of days between last admission and this admission? 63   Were your symptoms the same as before? no   Did your support systems work?  Yes   Are you repsonsible for setting up/taking your own medications? Is this working?  Yes   Did you call your PCP/[facility] [place] Provider  Yes   What was the Providers response? patient had ultrasound and then sent to ED   Were you able to attend your hospital follow up? Yes   If you had d/c  resources set up proir to discharge what were they, and were you seen by them? Yes   D/C resources set up prior to discharge Home Health   Did you have any barriers for a succesful discharge? Cost of meds, food, transportation No   Medicare Intent to Discharge Documentation   Discharge IMM give to: Patient   Discharge IMM Letter Given Date 12/18/17   Discharge IMM Letter Given Time 1300   IMM explained/reviewed with:  Patient   Care Management Plan   Discharge Planning Status initial meeting   Projected Discharge Date 12/18/17   CM will evaluate for rehabilitation potential no   Patient choice offered to patient/family yes   Discharge Needs Assessment   Outpatient/Agency/Support Group Needs homecare agency  [facility])   Equipment Currently Used at Home none   Equipment Needed After Discharge none   Discharge Facility/Level of Care Needs Home with Home Health (code 6)   Transportation Available car;family or friend will provide   Referral Information   Admission Type inpatient   Address Verified verified-no changes   Arrived From acute hospital, other   [place]  Verified verified-no change   ADVANCE DIRECTIVES   Does the Patient have an Advance Directive? No, Information Offered and Refused   Employment/Financial   Patient has Prescription Coverage?  Yes        Name of Insurance Coverage for Medications Medicare   Financial Concerns inadequate insurance coverage   Living Environment   Select an age group to open "lives with" row.  Adult   Lives With spouse   Living Arrangements house   Able to Return to Prior Arrangements yes   Home Safety   Home Assessment: No Problems Identified   Home Accessibility bed and bath on same level;stairs to enter home   Living Environment   Number of Stairs to [place] 34   57 year old male admitted from [facility] with LLE DVT and bilateral pulmonary embolism;admitted for heparin drip,medication management    Discharge Plan:   Home (Patient/Family Member/other) (code 1)  Met with patient and wife for initial assessment. Patient lives in a one story home with his wife. Pt states he is independent in self care and medications. Wife will provide transportation at discharge. Pt follows with Gavin Potters APRN and was seen 3 days ago. Pt does not have MPOA, declines lay caregiver. Pt is active with [facility] nursing/PT/OT. Pt has no DME at home. Pt uses [facility] and states he has prescription coverage but copays are sometimes difficult to afford. [facility] gave patient card for free 30 day trial for Eliquis and wife will follow with their pharmacy for any other discount programs available. Patient is for discharge today. Discharge IMM explained to patient, patient signed form, declined copy,signed copy placed in CM office.    Resumption home health order placed. Service aware to sign. Resumption referral placed via [employer]. Number for bedside to call report placed in AVS.    The patient will continue to be evaluated for developing discharge needs.     Case Manager: Liam Graham, [place] COORDINATOR  Phone: [phone]

## 2017-12-18 NOTE — Discharge Instructions (Addendum)
Nursing to call report to [facility] [phone]    If you have any questions after you are discharged, but before you see your Primary Care Physician you can contact Caren Griffins, Hospitalist Service Coordinator at [phone].

## 2017-12-18 NOTE — Care Plan (Signed)
Pt had no acute issues throughout the shift. He is a high fall risk refusing the sitter select and seizure precautions refusing the padded side rails. He is able to turn and position independently, and is standby assist with ambulation. Wife agrees to stay at bedside for fall and seizure precautions. Emotional support and continuing education has been provided. The plan is to continue [place] for seizures and possibly discharge home today on Eliquis.   Alphonzo Cruise, RN  12/18/2017, 01:03    Problem: Adult Inpatient Plan of Care  Goal: Plan of Care Review  Outcome: Ongoing (see interventions/notes)  Goal: Patient-Specific Goal (Individualization)  Outcome: Ongoing (see interventions/notes)  Goal: Absence of Hospital-Acquired Illness or Injury  Outcome: Ongoing (see interventions/notes)  Goal: Optimal Comfort and Wellbeing  Outcome: Ongoing (see interventions/notes)

## 2017-12-18 NOTE — Care Plan (Signed)
[facility]  Physical Therapy Initial Evaluation    Patient Name: Richard [name]  Date of Birth: [birth year]  Height: Height: 175.3 cm (5\' 9" )  Weight: Weight: 87.8 kg (193 lb 9 oz)  Room/Bed: 06/A  Payor: [place] MEDICARE / Plan: [place] MEDICARE ADVANTAGE PPO / Product Type: PPO /     Assessment:      Richard [name] appears to be mobilizing close to his baseline and he is safe for return home from a PT standpoint. Thank you for the consult, will sign off.    Activity Recommendation: Ambulate in the halls 3x daily with supervision      Discharge Needs:   Equipment Recommendation: none anticipated   Discharge Disposition: home with assist, home with home health    Plan:   [facility] physical therapy.    The risks/benefits of therapy have been discussed with the patient/caregiver and he/she is in agreement with the established plan of care.       Subjective & Objective        12/18/17 1040   Therapist Pager   PT Assigned/ Pager # dave [phone]   Rehab Session   Document Type evaluation   Total PT Minutes: 20   Patient Effort good   General Information   Patient/Family/Caregiver Comments/Observations Resting in bed, pleasant and cooperative. Feels generally weak but close to baseline   Pertinent History of Current Functional Problem Richard. [place] [name] is a 57 yr old male patient that was admitted for a possible GI bleed and found to have bilateral PEs.   Respiratory Status room air   Existing Precautions/Restrictions fall precautions   Mutuality/Individual Preferences   Individualized Care Needs Amb hall TID Ax1   Living Environment   Lives With spouse   Living Arrangements house   Number of Stairs to [place] 3   Living Environment Comment 10 Steps within they sometimes use   Functional Level Prior   Ambulation 0 - independent   Transferring 0 - independent   Self-Care   Equipment Currently Used at Home none   Cognitive Assessment/Interventions   Behavior/Mood Observations behavior  appropriate to situation, WNL/WFL   Orientation Status oriented x 4   Attention WNL/WFL   Follows Commands WNL   Pain Assessment   Pain Scale: Numbers, Pretreatment 0/10 - no pain   Pain Scale: Numbers, Post-Treatment 0/10 - no pain   Bed Mobility Assessment/Treatment   Supine-Sit Independence independent   Sit to Supine, Independence independent   Transfer Assessment/Treatment   Sit-Stand Independence independent   Stand-Sit Independence independent   Gait Assessment/Treatment   Distance in Feet 200 ft with supervision, no major LOB or unsteadiness   Balance Skill Training   Sitting Balance: Static good balance   Sitting, Dynamic (Balance) good balance   Sit-to-Stand Balance good balance   Standing Balance: Static good balance   Standing Balance: Dynamic fair + balance   Therapeutic Exercise/Activity   Comment up/down 12 stair steps step over step, 1 hand rail, mod I. Instructed pt in hamstring and calf stretch supine active   Post Treatment Status   Post Treatment Patient Status Patient supine in bed;Call light within reach;Telephone within reach   Support Present Post Treatment  Family present   Basic Mobility Am-PAC/6Clicks Score   Turning in bed without bedrails 4   Lying on back to sitting on edge of flat bed 4   Moving to and from a bed to a chair 4   Standing  up from chair 4   Walk in room 4   Climbing 3-5 steps with railing 4   6 Clicks Raw Score total 24   Standardized (t-scale) score 57.68   CMS 0-100% Score 0   CMS Modifier CH   Exercise/Activity Level Performed 7- Walked 25 feet or more   Physical Therapy Clinical Impression   Assessment Richard [name] appears to be mobilizing close to his baseline and he is safe for return home from a PT standpoint. Thank you for the consult, will sign off.   Anticipated Equipment Needs at Discharge (PT) none anticipated   Anticipated Discharge Disposition home with assist;home with home health   Evaluation Complexity Justification   Patient History: Co-morbidity/factors  that impact Plan of Care 1-2 that impact Plan of Care;One or more other medical co-morbidity   Examination Components 4 or more Exam elements addressed;Bed mobility;Transfers;Ambulation;Balance   Presentation Evolving: Symptoms, complaints, characteristics of condition changing &/or cognitive deficits present   Clinical Decision Making Moderate complexity   Evaluation Complexity Moderate complexity   Physical Therapy Discharge Summary   Additional Documentation Discharge Summary (PT) (Group)   Discharge Summary, PT Eval   Reason for Discharge no further needs identified       Therapist:   Rayvon Char. Rozann Lesches PT, DPT  12/18/2017 13:48  Pager #: [phone]

## 2017-12-18 NOTE — Progress Notes (Signed)
[facility]  Medicine Progress Note    Richard [name]  Date of service: 12/18/2017  Date of Admission:  12/15/2017    Hospital Day:  LOS: 3 days      Subjective: Patient is awake and alert and in no obvious distress. He was walking around the unit earlier today and took a shower as well. His wife is at the bedside. He states that he last had a bowel movement yesterday and it was brown in color. He is requesting a flu shot prior to discharge. He is declining iron replacement therapy due to constipation and has been increasing his oral intake of foods high in iron. A long discussion was held with the patient and his wife regarding anticoagulation. Initially, they were concerned about the cost of Eliquis because his wife was on Xarelto and had to switch to warfarin due to copay cost. The pharmacy was contacted and quoted a monthly copay price. Initially, this price was too expensive, so a discussion was held about switching to warfarin and bridging with Lovenox. The wife stated that she didn't want him to have to take Lovenox even if he was taught how to inject it. They were educated that due to active clots, it's important that he not have a period without anticoagulation to prevent extension of the clot. I also was able to get a price of the Lovenox for a 10-day supply, and this too, wasn't affordable, however, I was going to speak with care management about options, when his wife stated that she didn't want him to have to inject the Lovenox at all. She stated that they would be able to borrow the money needed for the 46-month supply of Eliquis and requested it be sent to the pharmacy. The patient denies any chest pain, SOB, or abdominal pain today. Hemoglobin remains stable.      Vital Signs:  Temp  Avg: 36.6 C (97.8 F)  Min: 36.2 C (97.1 F)  Max: 36.8 C (98.3 F)    Pulse  Avg: 71.4  Min: 65  Max: 78 BP  Min: 115/72  Max: 128/70   Resp  Avg: 17.2  Min: 16  Max: 18 SpO2  Avg: 98.2 %  Min: 96 %   Max: 100 %   Pain Score (Numeric, Faces): 0      Input/Output    Intake/Output Summary (Last 24 hours) at 12/18/2017 1249  Last data filed at 12/18/2017 1000  Gross per 24 hour   Intake 875 ml   Output 1375 ml   Net -500 ml    I/O last shift:  No intake/output data recorded.     Current Facility-Administered Medications:  acetaminophen (TYLENOL) tablet 650 mg Oral Q4H PRN   albuterol (PROVENTIL) 2.5 mg / 3 mL (0.083%) neb solution 2.5 mg Nebulization Q4H PRN   amitriptyline (ELAVIL) tablet 10 mg Oral NIGHTLY   apixaban (ELIQUIS) tablet 10 mg Oral 2x/day   calcium citrate + vitamin D (CITRACAL) 200mg  (elemental Ca)-200 unit tab 1 Tab Oral Daily   cholecalciferol (VITAMIN D3) 1000 unit (25 mcg) tablet 1,000 Units Oral Daily   docusate sodium (COLACE) capsule 100 mg Oral 2x/day   fluticasone (FLONASE) 50 mcg per spray nasal spray 2 Spray Each Nostril Daily   HYDROcodone-acetaminophen (NORCO) 5-325 mg per tablet 1 Tab Oral Q4H PRN   influenza virus vaccine (PF) IM injection (FLUARIX for ages 6 months through adult) 0.5 mL Intramuscular Once   levETIRAcetam (KEPPRA) tablet 250 mg Oral 2x/day  levothyroxine (SYNTHROID) tablet 100 mcg Oral QAM   magnesium hydroxide (MILK OF MAGNESIA) 400mg  per 46mL oral liquid 15 mL Oral 4x/day PRN   melatonin tablet 12 mg Oral HS PRN   multivitamin tablet 1 Tab Oral Daily   NS flush syringe 2 mL Intracatheter Q8HRS   And      NS flush syringe 2-6 mL Intracatheter Q1 MIN PRN   ondansetron (ZOFRAN) 2 mg/mL injection 4 mg Intravenous Q6H PRN   pantoprazole (PROTONIX) delayed release tablet 40 mg Oral Daily   psyllium (METAMUCIL) oral powder 1 Packet Oral 2x/day   tamsulosin (FLOMAX) capsule 0.4 mg Oral Daily after Dinner       Physical Exam:  Constitutional - awake and alert. No acute distress. Overweight body habitus. Appears older than stated age. Vital signs reviewed.  Eyes - PERRL  Ears, nose, mouth, throat  - face symmetric. External ears and nose normal in appearance. Moist mucus  membranes  Neck - symmetric. Trachea midline  Respiratory - chest expansion symmetric. Breath sounds clear and equal bilaterally throughout all fields anteriorly. No adventitious breath sounds  Cardiovascular - normal rate and rhythm. S1, S2 present. 3/5 systolic murmur that radiates to the right side of the chest. Trace edema of the bilateral lower extremities  Gastrointestinal - soft to palpation, non-tender. Bowel sounds present and normal in tone  Genitourinary - deferred  Musculoskeletal - extremities intact. No deformity. Head normocephalic.  Skin - warm and dry. Pale.  Neurological - appearance, behavior, and speech appropriate. Alert and oriented x 3  Psychological - appears calm      Labs:  Results for orders placed or performed during the hospital encounter of 12/15/17 (from the past 24 hour(s))   CBC   Result Value Ref Range    WBC 5.4 3.7 - 11.0 x10^3/uL    RBC 2.91 (L) 4.50 - 6.10 x10^6/uL    HGB 8.0 (L) 13.4 - 17.5 g/dL    HCT 26.2 (L) 38.9 - 52.0 %    MCV 90.0 78.0 - 100.0 fL    MCH 27.5 26.0 - 32.0 pg    MCHC 30.5 (L) 31.0 - 35.5 g/dL    RDW-CV 14.1 11.5 - 15.5 %    PLATELETS 173 150 - 400 x10^3/uL    MPV 10.8 8.7 - 12.5 fL   BASIC METABOLIC PANEL   Result Value Ref Range    SODIUM 137 136 - 145 mmol/L    POTASSIUM 4.5 3.5 - 5.1 mmol/L    CHLORIDE 105 96 - 111 mmol/L    CO2 TOTAL 25 22 - 32 mmol/L    ANION GAP 7 4 - 13 mmol/L    CALCIUM 8.8 8.5 - 10.2 mg/dL    GLUCOSE 90 65 - 139 mg/dL    BUN 11 8 - 25 mg/dL    CREATININE 1.26 0.62 - 1.27 mg/dL    BUN/CREA RATIO 9 6 - 22    ESTIMATED GFR 59 (L) >60 mL/min/1.3m^2     * All labs reviewed for the previous 24 hrs *    Radiology:  No new radiology in the past 24 hrs    PT/OT: Yes    Consults: GI    Hardware (lines, foley's, tubes): PIV    Assessment/ Plan:   Active Hospital Problems    Diagnosis   . Bilateral pulmonary embolism (CMS [facility])     Mr. Richard [name] is a 57 yr old male patient that was admitted for a possible GI bleed and found  to have  bilateral PEs.    VTE, bilateral acute pulmonary emboli, LLE DVT  - CT chest w/contrast on 10/10 showed acute bilateral pulmonary emboli  - Venous duplex of the LLE showed a DVT from the proximal femoral vein to the popliteal and peroneal veins  - VTE was provoked due to recent hospitalization in Sept 2019 for GI bleed  - Pt denies a previous hx of DVT/PE  - Currently anticoagulated with Eliquis, 10 mg, BID x 7 days (end 10/19) then 5 mg, BID thereafter for a duration of 3-6 months  - Long discussion held with patient and wife regarding affordable anticoagulation. Despite the cost, patient and wife choosing Eliquis  - Patient educated about the importance of not interrupting anticoagulation due to active clots and verbalized understanding  - Pt and wife educated on signs/symptoms of bleeding and when to return to the ER    Iron deficiency anemia, recent lower GI bleed, melena  - Pt was admitted in Sept 2019 with a GI bleed requiring transfusion  - Colonoscopy completed on 11/24/17 that showed old blood throughout the colon with mild diverticulosis in the sigmoid colon  - Patient had one episode of melena this admission, which resolved without intervention  - GI consulted and didn't feel this was a GI bleed  - Pt declines ferrous sulfate therapy due to constipation and is eating foods high in iron  - Hgb improving from 7.5 on 10/12 to 8.0 on 10/14  - PCP to recheck H&H at follow up visit  - Patient transferred from step-down to floor status on 10/14 and telemetry DC'ed  - Will plan for discharge today    AKI - resolved  - Creatinine 1.26 on 10/14  - No further action needed  - Avoid nephrotoxin medications    Chronic Conditions:  HDL - diet controlled. Patient not on a statin due to intolerance/allergy  Epilepsy - continue home Keppra  Essential HTN - antihypertensives were discontinued in Sept 2019 after GI bleed. BP has been well controlled and averaging 630-160F systolically this admission  Hypothyroidism -  continue home Synthroid  BPH - continue home Flomax      DVT/PE Prophylaxis: SCDs/ Venodynes/Impulse boots    Disposition Planning: Home discharge with resumption of home health      Greater than 30 minutes was spent in the discharge process including patient education, med reconciliation, and transitions of care.       Cheron Every, APRN,NP-C  12/18/2017, 13:19  Internal Medicine  Med Hospitalist 4  Pager [phone]    I personally saw and examined the patient. See Nurse Practitioner's note for additional details.     Clemens Catholic, MD

## 2017-12-18 NOTE — Discharge Summary (Signed)
[facility]  DISCHARGE SUMMARY    PATIENT NAME:  Richard [name], Richard [name]  MRN:  [id number]  DOB:  03-30-1960    ENCOUNTER DATE:  12/15/2017  INPATIENT ADMISSION DATE: 12/15/2017  DISCHARGE DATE:  12/18/2017    ATTENDING PHYSICIAN: Clemens Catholic, MD  SERVICE: HOSPITALIST 4  PRIMARY CARE PHYSICIAN: Gavin Potters, APRN     Has PCP been verified with patient and updated? Yes    LAY CAREGIVER:  ,  ,        PRIMARY DISCHARGE DIAGNOSIS:   Active Hospital Problems    Diagnosis Date Noted   . Bilateral pulmonary embolism (CMS [facility]) [I26.99] 12/15/2017      Resolved Hospital Problems   No resolved problems to display.     Active Non-Hospital Problems    Diagnosis Date Noted   . DVT (deep venous thrombosis) (CMS HCC) 12/14/2017   . Pulmonary embolism (CMS HCC) 12/14/2017   . Acute GI bleeding 11/23/2017   . History of stress test 11/10/2017   . H/O echocardiogram 11/10/2017   . S/P AVR 08/07/2017   . Essential hypertension 08/07/2017   . Hyperlipidemia 06/05/2017   . Epilepsy (CMS [place]) 06/05/2017   . 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 Hypothyroidism 06/05/2017        DISCHARGE MEDICATIONS:     Current Discharge Medication List      START taking these medications.      Details   apixaban 5 mg Tablet  Commonly known as:  ELIQUIS   Take 2 tabs, PO, BID until 10/19. On 10/20, start taking 1 tab, BID  Qty:  60 Tab  Refills:  2        CONTINUE these medications - NO CHANGES were made during your visit.      Details   amitriptyline 10 mg Tablet  Commonly known as:  ELAVIL   10 mg, Oral, NIGHTLY  Refills:  0     calcium citrate-vitamin D3 200 mg calcium -250 unit Tablet  Commonly known as:  CITRACAL   Oral, DAILY  Refills:  0     docusate sodium 100 mg Capsule  Commonly known as:  COLACE   100 mg, Oral, 2 TIMES DAILY  Refills:  0     FISH OIL-VIT D3 300-1,000-1,000 mg-mg-unit Capsule  Generic drug:  OM-3-DHA-EPA-Fish Oil-Vit D3   3 Caps, Oral, DAILY, Dosage 2000, mg fish oil/1000 iu Vitamin  d3  Refills:  0     fluticasone propionate 50 mcg/actuation Spray, Suspension  Commonly known as:  FLONASE   2 Sprays, Each Nostril, DAILY  Refills:  0     levETIRAcetam 250 mg Tablet  Commonly known as:  KEPPRA   250 mg, Oral, 2 TIMES DAILY  Qty:  1 Tab  Refills:  11     levothyroxine 100 mcg Tablet  Commonly known as:  SYNTHROID   100 mcg, Oral, EVERY MORNING  Refills:  0     melatonin 5 mg Tablet   10 mg, Oral, NIGHTLY  Refills:  0     multivitamin Tablet   1 Tab, Oral, DAILY  Refills:  0     pantoprazole 40 mg Tablet, Delayed Release (E.C.)  Commonly known as:  PROTONIX   40 mg, Oral, DAILY  Refills:  0     polyethylene glycol 17 gram/dose Powder  Commonly known as:  MIRALAX   17 g, Oral, DAILY  Refills:  0  psyllium Packet  Commonly known as:  METAMUCIL   1 Packet, Oral, 2 TIMES DAILY  Refills:  0     tamsulosin 0.4 mg Capsule  Commonly known as:  FLOMAX   0.4 mg, Oral, EVERY EVENING AFTER DINNER  Refills:  0     VITAMIN D2 ORAL   1 Tab, Oral, DAILY  Refills:  0        STOP taking these medications.    heparin in 0.45% NS 25,000 unit/250 mL Parenteral Solution          Discharge med list refreshed?  YES        During this hospitalization did the patient have an AMI, PCI/PCTA, STENT or Isolated CABG?  No              ALLERGIES:  Allergies   Allergen Reactions   . Statins-Hmg-Coa Reductase Inhibitors Nausea/ Vomiting       HOSPITAL PROCEDURE(S):   Bedside Procedures:  No orders of the defined types were placed in this encounter.    Surgical: None    REASON FOR HOSPITALIZATION AND HOSPITAL COURSE     BRIEF HPI:  Richard [name] is a 57 y.o., White male who presents in transfer from [place] with LLE pain/DVT. Patient states he began having pain ("Charlie horse") in his left leg approximately 1 week ago. It gradually worsened. He is having some mild numbness/tingling in his left toes. He went to see his PCP about this and was sent for "a scan" per patient. Patient describes an extensive DVT of the LLE to  me and states a CT chest w/contrast was ordered as well which showed bilateral PE. He went to [facility] at this point. He denies fevers, chills, chest pain, abd pain, nausea, vomiting, and diarrhea. Patient's H&H is slowly trending down, so patient was transferred to [facility] prior to initiating anticoagulation. He was recently discharged from our facility on 12/01/17 which he was hospitalized for symptomatic anemia from diverticular bleeding. He admits to one black stool today. He states his stool has been fluctuating from light brown to dark brown, but today it was actually black. No BRBPR.    BRIEF HOSPITAL NARRATIVE: A CT chest was completed on 10/10 that showed acute bilateral pulmonary emboli. A venous duplex of the LLE showed a DVT from the proximal femoral vein to the popliteal and peroneal veins. It's suspected that his VTE was provoked due to recent hospitalization in Sept 2019 for a GI bleed. He's never had a blood clot prior to this, so duration of therapy should be 3-6 months and determined by his PCP. He was anticoagulated with Eliquis, 10 mg, BID x 7 days (end 10/19) and then transitioning to 5 mg, BID thereafter. There was concern about the cost of the medication, but the patient agreed to pay for the Eliquis for the duration of therapy. His PCP should monitor the patient's compliance with the medication due to cost.    He does have iron deficiency anemia due to a recent lower GI bleed in Sept. 2019. He presented with one episode of melena this admission, but GI didn't feel this was related to an actual GI bleed. A colonoscopy completed on 11/24/17 that showed old blood throughout the colon with mild diverticulosis in the sigmoid colon. He doesn't want to take supplemental ferrous sulfate due to constipation and has increased his oral intake of foods high in iron. His hemoglobin remained stable and actually improved from 7.5 on 10/12 to  8.0 on 10/14. His hgb has been averaging in the 7-8s since  his GI bleed in Sept. His PCP should recheck his H&H at his follow up visit.     He also had a mild AKI on admission. At discharge, his creatinine was 1.26. He will be discharged home and have a resumption of his home health.     TRANSITION/POST DISCHARGE CARE/PENDING TESTS/REFERRALS:   1. PCP to recheck H&H at follow up visit and ensure compliance with anticoagulation      CONDITION ON DISCHARGE:  A. Ambulation: Full ambulation  B. Self-care Ability: Complete  C. Cognitive Status Alert and Oriented x 3  D. Code status at discharge:   Code Status Information     Code Status    Full Code          LINES/DRAINS/WOUNDS AT DISCHARGE:   Patient Lines/Drains/Airways Status    Active Line / Dialysis Catheter / Dialysis Graft / Drain / Airway / Wound     Name: Placement date: Placement time: Site: Days:    Peripheral IV Right Cephalic  (lateral side of arm)  12/15/17   1822   2    Peripheral IV Posterior;Right Wrist  12/15/17   1822   2                DISCHARGE DISPOSITION:  Home discharge and Home Health        DISCHARGE INSTRUCTIONS:  Follow-up Information     Gavin Potters, APRN. Go on 12/22/2017.    Specialties:  [place], EMERGENCY MEDICINE  Why:  Hospital discharge follow up at 9:00 am  Contact information:  [place]  [place] [address]  [phone]                 No discharge procedures on file.      Cheron Every, APRN,NP-C  12/18/2017, 13:30  Internal Medicine  Med Hospitalist 4  Pager [phone]      Copies sent to Care Team       Relationship Specialty Notifications Start End    Gavin Potters, APRN PCP - General NURSE PRACTITIONER  06/05/17     Verified by transitions team 11/27/17 --CMM    Phone: [phone] Fax: [phone]         [address] [place] [address]          Referring providers can utilize [email or web address] to access their referred [place] patient's information.       I personally saw and examined the patient. See Nurse Practitioner's note for additional details. My findings  are  NAE  Filed Vitals:    12/17/17 1941 12/17/17 2319 12/18/17 0325 12/18/17 0814   BP: 115/72 125/80 128/70 119/70   Pulse: 70 75 65 78   Resp: 16 18 18 18    Temp: 36.7 C (98.1 F) 36.8 C (98.3 F) 36.2 C (97.1 F) 36.4 C (97.5 F)   SpO2: 99% 96% 100% 98%     Patient discharged on apixaban, will need at least 3 months of therapy    Clemens Catholic, MD

## 2017-12-19 ENCOUNTER — Encounter (HOSPITAL_COMMUNITY): Payer: Self-pay

## 2017-12-20 NOTE — Care Management Notes (Signed)
Referral Information  ++++++ Placed Provider #1 ++++++  Case Manager: Kristen Zelnis  Provider Type: Home Health - Return  Provider Name: [facility]  Address:  [address]  [place], [place] [address]  Contact:    Fax:   Fax:

## 2017-12-22 ENCOUNTER — Ambulatory Visit (HOSPITAL_COMMUNITY): Payer: Medicare PPO

## 2017-12-22 ENCOUNTER — Encounter (INDEPENDENT_AMBULATORY_CARE_PROVIDER_SITE_OTHER): Payer: Self-pay | Admitting: Surgical

## 2017-12-22 ENCOUNTER — Ambulatory Visit: Payer: Medicare PPO | Attending: Surgical | Admitting: Surgical

## 2017-12-22 DIAGNOSIS — Z7901 Long term (current) use of anticoagulants: Secondary | ICD-10-CM | POA: Insufficient documentation

## 2017-12-22 DIAGNOSIS — Z6829 Body mass index (BMI) 29.0-29.9, adult: Secondary | ICD-10-CM | POA: Insufficient documentation

## 2017-12-22 DIAGNOSIS — Z0189 Encounter for other specified special examinations: Secondary | ICD-10-CM | POA: Insufficient documentation

## 2017-12-22 DIAGNOSIS — I2699 Other pulmonary embolism without acute cor pulmonale: Secondary | ICD-10-CM

## 2017-12-22 LAB — CBC
HCT: 30.5 % — ABNORMAL LOW (ref 38.9–52.0)
HGB: 9.1 g/dL — ABNORMAL LOW (ref 13.4–17.5)
MCH: 26.9 pg (ref 26.0–32.0)
MCHC: 29.8 g/dL — ABNORMAL LOW (ref 31.0–35.5)
MCV: 90.2 fL (ref 78.0–100.0)
MPV: 10.4 fL (ref 8.7–12.5)
PLATELETS: 206 x10ˆ3/uL (ref 150–400)
RBC: 3.38 x10ˆ6/uL — ABNORMAL LOW (ref 4.50–6.10)
RDW-CV: 14.3 % (ref 11.5–15.5)
WBC: 6.8 x10ˆ3/uL (ref 3.7–11.0)

## 2017-12-22 LAB — BASIC METABOLIC PANEL
ANION GAP: 6 mmol/L (ref 5–15)
BUN/CREA RATIO: 14 (ref 6–20)
BUN: 19 mg/dL (ref 8–26)
CALCIUM: 9.3 mg/dL (ref 8.9–10.3)
CHLORIDE: 109 mmol/L (ref 101–111)
CO2 TOTAL: 25 mmol/L (ref 22–32)
CREATININE: 1.39 mg/dL — ABNORMAL HIGH (ref 0.90–1.30)
ESTIMATED GFR: 52 mL/min/1.73mˆ2 — ABNORMAL LOW (ref >60)
GLUCOSE: 104 mg/dL (ref 70–110)
POTASSIUM: 4.8 mmol/L (ref 3.6–5.1)
SODIUM: 140 mmol/L (ref 136–144)

## 2017-12-22 NOTE — Progress Notes (Signed)
[place], [place]  [place] [id number]     Chief Complaint    Hospital Follow Up        History of Present Illness    This patient is a 57 y.o. male who is being seen in the office today for hospital follow up. Patient ended up with a DVT/PE and was admitted to [facility]. Patient is accompanied by his wife. He states that overall he is feeling much better. He is eating fine. Denies shortness of breath, chest pain, nausea, vomiting, or trouble with urination/bowel movements. He has not noticed any further GI bleeding. He is taking Eliquis 10mg  BID and then on Sunday he will switch to Eliquis 5mg  BID (he has the prescription at home). He also has PT/OT coming to his home for evaluation today.     Patient had GI work up through Dr. Aris Georgia in [place]. He states that nothing further will need to be done at this time, only monitoring. He has a follow up in April 2020.       Social/family history reviewed with patient.         Allergies    Allergies   Allergen Reactions   . Statins-Hmg-Coa Reductase Inhibitors Nausea/ Vomiting       Patient History    Past Medical History has been reviewed, confirmed, and as follows below.  History obtained from Patient    Past Medical History:   Diagnosis Date   . Anxiety    . Arthropathy    . BPH (benign prostatic hyperplasia)    . Congenital anomaly of kidney     HORSESHOE KIDNEY   . Depression    . Diarrhea     CDIFF   . Epilepsy (CMS [place])     OVER 10 YEARS SINCE LAST SEIZURE-DR WIEMER   . Esophageal reflux    . GIB (gastrointestinal bleeding) 2017   . Headache    . High blood pressure    . Hx of aortic valve replacement    . Hypercholesterolemia    . Hyperlipidemia    . Hypothyroidism    . Mass of esophagus    . Nodular prostate    . Osteoarthritis cervical spine    . Palpitations    . Pneumonia    . Testicular hypofunction    . Wears glasses      Past Surgical History:   Procedure Laterality  Date   . COLONOSCOPY     . COLONOSCOPY N/A 11/24/2017    Performed by Vella Raring, MD at [place]   . HX AORTIC VALVE REPLACEMENT  2009   . HX COLECTOMY  2016    FOR OBSTRUCTION   . HX HERNIA REPAIR     . HX TURP     . HX UPPER ENDOSCOPY  04/05/2016   . INCISIONAL HERNIA REPAIR     . VENTRAL HERNIA REPAIR       Family Medical History:     Problem Relation (Age of Onset)    Blood Clots Paternal Grandmother    Coronary Artery Disease Mother, Father    Epilepsy Sister    Heart Attack Father    High Cholesterol Mother, Father    Hypertension (High Blood Pressure) Sister, Sister    Stroke Mother    Thyroid Disease Mother            Social History  Socioeconomic History   . Marital status: Married     Spouse name: Not on file   . Number of children: Not on file   . Years of education: Not on file   . Highest education level: Not on file   Occupational History   . Occupation: disabled    Tobacco Use   . Smoking status: Never Smoker   . Smokeless tobacco: Never Used   Substance and Sexual Activity   . Alcohol use: Yes     Alcohol/week: 1.0 standard drinks     Types: 1 Glasses of wine per week     Binge frequency: Less than monthly   . Drug use: Never   . Sexual activity: Yes     Partners: Female       Current Outpatient Medications:   .  amitriptyline (ELAVIL) 10 mg Oral Tablet, Take 10 mg by mouth Every night, Disp: , Rfl:   .  apixaban (ELIQUIS) 5 mg Oral Tablet, Take 2 tabs, PO, BID until 10/19. On 10/20, start taking 1 tab, BID, Disp: 60 Tab, Rfl: 2  .  calcium citrate-vitamin D3 (CITRACAL) 200 mg calcium -250 unit Oral Tablet, Take by mouth Once a day, Disp: , Rfl:   .  docusate sodium (COLACE) 100 mg Oral Capsule, Take 100 mg by mouth Twice daily, Disp: , Rfl:   .  ergocalciferol, vitamin D2, (VITAMIN D2 ORAL), Take 1 Tab by mouth Once a day, Disp: , Rfl:   .  fluticasone propionate (FLONASE) 50 mcg/actuation Nasal Spray, Suspension, 2 Sprays by Each Nostril route Once a day, Disp: , Rfl:   .  levETIRAcetam  (KEPPRA) 250 mg Oral Tablet, Take 1 Tab (250 mg total) by mouth Twice daily for 30 days, Disp: 1 Tab, Rfl: 11  .  levothyroxine (SYNTHROID) 100 mcg Oral Tablet, Take 100 mcg by mouth Every morning, Disp: , Rfl:   .  melatonin 5 mg Oral Tablet, Take 10 mg by mouth Every night , Disp: , Rfl:   .  multivitamin Oral Tablet, Take 1 Tab by mouth Once a day, Disp: , Rfl:   .  OM-3-DHA-EPA-Fish Oil-Vit D3 (FISH OIL-VIT D3) 300-1,000-1,000 mg-mg-unit Oral Capsule, Take 3 Caps by mouth Once a day Dosage 2000, mg fish oil/1000 iu Vitamin d3 , Disp: , Rfl:   .  pantoprazole (PROTONIX) 40 mg Oral Tablet, Delayed Release (E.C.), Take 40 mg by mouth Once a day , Disp: , Rfl:   .  polyethylene glycol (MIRALAX) 17 gram/dose Oral Powder, Take 17 g by mouth Once a day, Disp: , Rfl:   .  psyllium (METAMUCIL) Packet, Take 1 Packet by mouth Twice daily, Disp: , Rfl:   .  tamsulosin (FLOMAX) 0.4 mg Oral Capsule, Take 0.4 mg by mouth Every evening after dinner, Disp: , Rfl:       Review of Systems    Review of Systems was obtained and is unremarkable except as stated in HPI.    Vital Signs    Most Recent Vitals      Office Visit from 12/22/2017 in Internal Medicine, [place]   Temperature  36.5 C (97.7 F) filed at... 12/22/2017 0915   Heart Rate  82 filed at... 12/22/2017 0915   Respiratory Rate  16 filed at... 12/22/2017 0915   BP (Non-Invasive)  130/82 filed at... 12/22/2017 0915   SpO2  99 % filed at... 12/22/2017 0915   Height  1.753 m (5\' 9" ) filed at... 12/22/2017 0915  Weight  89.4 kg (197 lb) filed at... 12/22/2017 0915   BMI (Calculated)  29.15 filed at... 12/22/2017 0915   BSA (Calculated)  2.09 filed at... 12/22/2017 0915            Physical Exam    Constitutional 57 y.o.  male, in no acute distress at time of exam.    EENT: Eyes PERRL, EOMI, no obvious sign of infection.  Ears, Nose, Throat: Ears Clear, TM's intact, Nose patent and without significant mucosal abnormality, Oral cavity clear, Pharynx benign.   Neck without mass or adenopathy, trachea midline.    Cardiovascular: regular heart rate and rhythm, + murmur, gallops or rubs.  + Left lower extremity-edema.    Respiratory Respirations even and unlabored. Lungs clear to auscultation, negative wheezes. No extension of expiratory phase.   Gastrointestinal: Soft, non-tender, no distension, no obvious mass, organomegaly or bruit.   Integumentary: No cyanosis noted.    Psychiatric Alert and oriented x 4 at office visit today.  Affect appropriate to content.  No obvious Psychopathology.    Hematologic/Lymphatic: No Lymphadenopathy, no abnormal bruising.        Impressions/Plan    (I26.99) Bilateral pulmonary embolism (CMS [facility])  Plan: Reviewed hospital records with patient. We discussed that he will need to remain on Eliquis at this time. Risks of discontinuation was discussed. He was also made aware of adverse symptoms to monitor for in regards to GI bleed, but also of clots. Repeat labs were ordered for today. Follow up in 1 month or sooner if any problems.             Follow up    Return in about 4 weeks (around 01/19/2018).      The patient was given the opportunity to ask questions and those questions were answered to the patient's satisfaction. The patient was encouraged to call with any additional questions or concerns. Instructed patient to call back if symptoms worsen. Patient and provider shared in the decision making process.     Discussed with patient effects and side effects of medications. Medication safety was discussed. A copy of the patient's medication list was printed and given to the patient. A good faith effort was made to reconcile the patient's medications.      Patient was counseled about lifestyle modification including increasing physical activity and proper diet including balanced diet and portion control. Patient also counseled about age-appropriate health maintenance issues including obtaining regular lab work, eye exams, dental exams,  screening procedures/tests, vaccination, and appropriate vitamin supplementation. Also instructed to follow-up with specialists as scheduled    Patient was seen independently with co-signing physician available for consult.     Lyndel Safe, PA-C  12/22/2017, 11:06    I agree with above plan. Any exceptions are noted above.    Yolanda Bonine, DO

## 2017-12-27 ENCOUNTER — Telehealth (HOSPITAL_COMMUNITY): Payer: Self-pay | Admitting: Internal Medicine

## 2017-12-27 NOTE — Telephone Encounter (Signed)
Attempted call to do apixaban (eliquis) education; will send educational materials to address listed in Epic.

## 2017-12-28 ENCOUNTER — Ambulatory Visit (INDEPENDENT_AMBULATORY_CARE_PROVIDER_SITE_OTHER): Payer: Medicare PPO | Admitting: Internal Medicine

## 2017-12-28 ENCOUNTER — Encounter

## 2017-12-28 ENCOUNTER — Ambulatory Visit (INDEPENDENT_AMBULATORY_CARE_PROVIDER_SITE_OTHER): Payer: Self-pay | Admitting: Internal Medicine

## 2017-12-28 VITALS — BP 168/89 | HR 80 | Ht 69.0 in | Wt 195.9 lb

## 2017-12-28 DIAGNOSIS — Z952 Presence of prosthetic heart valve: Principal | ICD-10-CM

## 2017-12-28 DIAGNOSIS — I10 Essential (primary) hypertension: Secondary | ICD-10-CM

## 2017-12-28 DIAGNOSIS — R0609 Other forms of dyspnea: Secondary | ICD-10-CM

## 2017-12-28 DIAGNOSIS — I350 Nonrheumatic aortic (valve) stenosis: Secondary | ICD-10-CM

## 2017-12-28 NOTE — Telephone Encounter (Addendum)
Called pt wife back and explained to her that based on Dr Johny Shock office note, pt is to start back on his Losartan and monitor his BP for the next 2 weeks w/ his home health nurse and call w/ BP readings. If BP remains elevated then we will add back his amlodipine. Pt wife verbalized an understanding w/ no questions.  Hoy Finlay, RN  12/28/2017, 12:09    ----- Message from Georgina Snell sent at 12/28/2017 10:41 AM EDT -----  Pt was here this morning. His wife left a message asking if he is to resume his two BP meds. Losartan and Amlodipine?  He was taken off of it when he was in hospital and in office today his BP was elevated.

## 2017-12-28 NOTE — Nursing Note (Signed)
Meds checked with patients list and confirmed correct pharmacy with patient for e-scribing.   Hoy Finlay, RN  12/28/2017, 08:40

## 2017-12-28 NOTE — Patient Instructions (Signed)
Patient advised to take 50 mg of losartan daily from today, get his blood pressure reading through his visiting nurse for the next 2 weeks.  If the blood pressure continues to be above 140/80, resume Norvasc also at 5 mg a day that will be half a tablet of 40 has daily.  Patient to follow up with me in 3 months with an echocardiogram just prior to the visit.

## 2017-12-28 NOTE — Progress Notes (Signed)
[facility]  [address]  [place] [address]  Phone: [phone]  Fax: [phone]    Encounter Date: 12/28/2017    Patient ID:  Richard [name]  QPY:[id number]    DOB: 05-Nov-1960  Age: 57 y.o. male    Subjective:     Chief Complaint   Patient presents with   . Hospital Follow Up   . Edema     L leg due to DVT    . Dizziness   . Cough     57 year old gentleman with history of aortic valve replacement about 10 years ago for a bicuspid aortic valve with aortic regurgitation per history with implantation of a bio prosthetic valve.  He was admitted with GI bleeding in September with colonoscopy revealing blood in his colon however no active source.  He stayed in bed for few days and subsequently got readmitted in early October with extensive DVT of left lower extremity as well as bilateral pulmonary emboli.  He was started on Eliquis and discharged home.  He is in my office to follow up.  Since last office visit he had a stress test and an echocardiogram the stress test revealed normal LV function with no evidence for ischemia.  His echocardiogram suggested significant gradient across his aortic valve with velocities measured across the aortic valve more than 4 m, mean gradient of 36. He does indicate occasional chest discomfort as well as exertion related shortness of breath which is gradually getting better since the treatment of his DVT.  He has been off his antihypertensive medication his blood pressure has been running high.  He is getting physical therapy.  Compliant with his medications as listed below.            Current Outpatient Medications   Medication Sig   . apixaban (ELIQUIS) 5 mg Oral Tablet Take 2 tabs, PO, BID until 10/19. On 10/20, start taking 1 tab, BID   . docusate sodium (COLACE) 100 mg Oral Capsule Take 100 mg by mouth Twice daily   . ergocalciferol, vitamin D2, (VITAMIN D2 ORAL) Take 1 Tab by mouth Once a day   . levETIRAcetam (KEPPRA) 250 mg Oral Tablet Take  1 Tab (250 mg total) by mouth Twice daily for 30 days   . levothyroxine (SYNTHROID) 100 mcg Oral Tablet Take 100 mcg by mouth Every morning   . melatonin 5 mg Oral Tablet Take 10 mg by mouth Every night    . multivitamin Oral Tablet Take 1 Tab by mouth Once a day   . OM-3-DHA-EPA-Fish Oil-Vit D3 (FISH OIL-VIT D3) 300-1,000-1,000 mg-mg-unit Oral Capsule Take 3 Caps by mouth Once a day Dosage 2000, mg fish oil/1000 iu Vitamin d3    . pantoprazole (PROTONIX) 40 mg Oral Tablet, Delayed Release (E.C.) Take 40 mg by mouth Once a day    . psyllium (METAMUCIL) Packet Take 1 Packet by mouth Twice daily     Allergies   Allergen Reactions   . Statins-Hmg-Coa Reductase Inhibitors Nausea/ Vomiting     Past Medical History:   Diagnosis Date   . Anxiety    . Arthropathy    . BPH (benign prostatic hyperplasia)    . Congenital anomaly of kidney     HORSESHOE KIDNEY   . Depression    . Diarrhea     CDIFF   . DVT (deep venous thrombosis) (CMS HCC)    . Epilepsy (CMS HCC)     OVER 10 YEARS SINCE LAST SEIZURE-DR  WIEMER   . Esophageal reflux    . GIB (gastrointestinal bleeding) 2017   . Headache    . High blood pressure    . Hx of aortic valve replacement    . Hypercholesterolemia    . Hyperlipidemia    . Hypothyroidism    . Mass of esophagus    . Nodular prostate    . Osteoarthritis cervical spine    . Palpitations    . Pneumonia    . Pulmonary emboli (CMS HCC)    . Testicular hypofunction    . Wears glasses          Past Surgical History:   Procedure Laterality Date   . COLONOSCOPY     . HX AORTIC VALVE REPLACEMENT  2009   . HX COLECTOMY  2016    FOR OBSTRUCTION   . HX HERNIA REPAIR     . HX TURP     . HX UPPER ENDOSCOPY  04/05/2016   . INCISIONAL HERNIA REPAIR     . VENTRAL HERNIA REPAIR           Family Medical History:     Problem Relation (Age of Onset)    Blood Clots Paternal Grandmother    Coronary Artery Disease Mother, Father    Epilepsy Sister    Heart Attack Father    High Cholesterol Mother, Father    Hypertension (High  Blood Pressure) Sister, Sister    Stroke Mother    Thyroid Disease Mother            Social History     Tobacco Use   . Smoking status: Never Smoker   . Smokeless tobacco: Never Used   Substance Use Topics   . Alcohol use: Yes     Alcohol/week: 1.0 standard drinks     Types: 1 Glasses of wine per week     Binge frequency: Less than monthly   . Drug use: Never       Review of Systems   Constitutional: Positive for fatigue. Negative for activity change, appetite change, chills, diaphoresis, fever and unexpected weight change.   HENT: Negative for hearing loss.    Eyes: Negative for visual disturbance.   Respiratory: Positive for shortness of breath. Negative for apnea, cough, chest tightness and wheezing.    Cardiovascular: Positive for chest pain. Negative for palpitations and leg swelling.   Gastrointestinal: Negative for abdominal pain, blood in stool, nausea and vomiting.   Endocrine: Negative for cold intolerance and heat intolerance.   Genitourinary: Negative for hematuria.   Musculoskeletal: Negative for arthralgias, gait problem and myalgias.   Skin: Negative for color change and rash.   Neurological: Negative for dizziness, seizures, syncope, speech difficulty, weakness and light-headedness.   Hematological: Does not bruise/bleed easily.   Psychiatric/Behavioral: Negative for sleep disturbance. The patient is not nervous/anxious.      Objective:   Vitals: BP (!) 168/89 (Site: Left)   Pulse 80   Ht 1.753 m (5\' 9" )   Wt 88.9 kg (195 lb 14.4 oz)   SpO2 97% Comment: ra  BMI 28.93 kg/m         Physical Exam   Constitutional: He is oriented to person, place, and time. He appears well-developed and well-nourished. No distress.   HENT:   Head: Normocephalic and atraumatic.   Eyes: No scleral icterus.   Neck: Neck supple. No JVD present.   Cardiovascular: Normal rate and regular rhythm. Exam reveals no gallop and no friction rub.  Murmur (Grade 3/6 ejection systolic murmur) heard.  Pulmonary/Chest: Effort  normal and breath sounds normal. No respiratory distress. He has no wheezes. He has no rales.   Abdominal: Soft. Bowel sounds are normal.   Musculoskeletal: He exhibits no edema or tenderness.   Neurological: He is alert and oriented to person, place, and time.   Skin: Skin is warm and dry. No rash noted. He is not diaphoretic. No erythema.   Psychiatric: He has a normal mood and affect. His behavior is normal.   Nursing note and vitals reviewed.      Assessment & Plan:     ENCOUNTER DIAGNOSES     ICD-10-CM   1. S/P AVR Z95.2   2. Essential hypertension I10   3. DOE (dyspnea on exertion) R06.09   4. Moderate to severe aortic stenosis I35.26     57 year old gentleman with history of aortic valve replacement over 10 years ago, recent echocardiogram suggests moderate to severe aortic stenosis.  He had a recent extensive DVT with bilateral acute pulmonary emboli.  He is on Eliquis.  He had recent GI bleed prior to starting the Eliquis.  However no bleeding reported since taking the Eliquis.  I will reassess him in 3 months with a repeat echocardiogram just prior to the visit.  And reassess his symptoms.  I will resume his losartan at 50 mg a day, he will check his blood pressure through his visiting nurse for the next 2 weeks and submitted to our office.  If his blood pressure stays over 140/80 will restart his amlodipine also at 5 mg a day.  Patient indicated that he still has those medications at home.    Orders Placed This Encounter   . TRANSTHORACIC ECHOCARDIOGRAM ([place] [facility] ONLY)       Return in about 3 months (around 03/30/2018).    Zella Ball, MD, [facility], [facility]  Interventional Cardiologist  Associate Professor of [employer] Department of Medicine, Section of Cardiology    12/28/2017 09:41

## 2018-01-02 ENCOUNTER — Ambulatory Visit (INDEPENDENT_AMBULATORY_CARE_PROVIDER_SITE_OTHER): Payer: Self-pay | Admitting: Surgical

## 2018-01-11 ENCOUNTER — Ambulatory Visit (INDEPENDENT_AMBULATORY_CARE_PROVIDER_SITE_OTHER): Payer: Self-pay | Admitting: Family Medicine

## 2018-01-19 ENCOUNTER — Encounter (INDEPENDENT_AMBULATORY_CARE_PROVIDER_SITE_OTHER): Payer: Self-pay | Admitting: Surgical

## 2018-01-19 ENCOUNTER — Ambulatory Visit: Payer: Medicare PPO | Attending: Surgical | Admitting: Surgical

## 2018-01-19 ENCOUNTER — Ambulatory Visit (INDEPENDENT_AMBULATORY_CARE_PROVIDER_SITE_OTHER): Payer: Medicare PPO

## 2018-01-19 DIAGNOSIS — I2699 Other pulmonary embolism without acute cor pulmonale: Secondary | ICD-10-CM

## 2018-01-19 DIAGNOSIS — Z6829 Body mass index (BMI) 29.0-29.9, adult: Secondary | ICD-10-CM | POA: Insufficient documentation

## 2018-01-19 DIAGNOSIS — Z7901 Long term (current) use of anticoagulants: Secondary | ICD-10-CM | POA: Insufficient documentation

## 2018-01-19 LAB — BASIC METABOLIC PANEL
ANION GAP: 5 mmol/L (ref 5–15)
BUN/CREA RATIO: 12 (ref 6–20)
BUN: 14 mg/dL (ref 8–26)
CALCIUM: 9 mg/dL (ref 8.9–10.3)
CHLORIDE: 105 mmol/L (ref 101–111)
CO2 TOTAL: 27 mmol/L (ref 22–32)
CREATININE: 1.2 mg/dL (ref 0.90–1.30)
ESTIMATED GFR: >60 mL/min/{1.73_m2} (ref >60)
GLUCOSE: 107 mg/dL (ref 70–110)
POTASSIUM: 3.8 mmol/L (ref 3.6–5.1)
SODIUM: 137 mmol/L (ref 136–144)

## 2018-01-19 LAB — CBC WITH DIFF
BASOPHIL #: <0.1 x10ˆ3/uL (ref <=0.20)
BASOPHIL %: 1 %
EOSINOPHIL #: 0.13 x10ˆ3/uL (ref <=0.50)
EOSINOPHIL %: 2 %
EOSINOPHIL %: 2 %
HCT: 34.2 % — ABNORMAL LOW (ref 38.9–52.0)
HGB: 10 g/dL — ABNORMAL LOW (ref 13.4–17.5)
HGB: 10 g/dL — ABNORMAL LOW (ref 13.4–17.5)
IMMATURE GRANULOCYTE #: <0.1 x10ˆ3/uL (ref <0.10)
IMMATURE GRANULOCYTE %: 0 % (ref 0–1)
LYMPHOCYTE #: 0.79 x10ˆ3/uL — ABNORMAL LOW (ref 1.00–4.80)
LYMPHOCYTE %: 13 %
MCH: 24.6 pg — ABNORMAL LOW (ref 26.0–32.0)
MCHC: 29.2 g/dL — ABNORMAL LOW (ref 31.0–35.5)
MCV: 84.2 fL (ref 78.0–100.0)
MONOCYTE #: 0.74 x10ˆ3/uL (ref 0.20–1.10)
MONOCYTE %: 12 %
MPV: 12.4 fL (ref 8.7–12.5)
NEUTROPHIL #: 4.34 x10ˆ3/uL (ref 1.50–7.70)
NEUTROPHIL %: 72 %
PLATELETS: 179 x10ˆ3/uL (ref 150–400)
RBC: 4.06 x10ˆ6/uL — ABNORMAL LOW (ref 4.50–6.10)
RDW-CV: 14.8 % (ref 11.5–15.5)
WBC: 6.1 x10ˆ3/uL (ref 3.7–11.0)

## 2018-01-19 NOTE — Progress Notes (Signed)
[place], [place]  [place] [place]  [place] [address]     Chief Complaint    Follow-up After Testing        History of Present Illness    This patient is a 57 y.o. male who is being seen in the office today for 1 month follow up of bilateral PE. Patient states that he is doing well. He continues to take Eliquis twice daily as directed. He continues to have PT/OT through [facility]. He denies having any problems today, but would like to have labs rechecked. He has recently seen cardiology.       Social/family history reviewed with patient.         Allergies    Allergies   Allergen Reactions   . Statins-Hmg-Coa Reductase Inhibitors Nausea/ Vomiting       Patient History    Past Medical History has been reviewed, confirmed, and as follows below.  History obtained from Patient    Past Medical History:   Diagnosis Date   . Anxiety    . Arthropathy    . BPH (benign prostatic hyperplasia)    . Congenital anomaly of kidney     HORSESHOE KIDNEY   . Depression    . Diarrhea     CDIFF   . DVT (deep venous thrombosis) (CMS HCC)    . Epilepsy (CMS [place])     OVER 10 YEARS SINCE LAST SEIZURE-DR WIEMER   . Esophageal reflux    . GIB (gastrointestinal bleeding) 2017   . Headache    . High blood pressure    . Hx of aortic valve replacement    . Hypercholesterolemia    . Hyperlipidemia    . Hypothyroidism    . Mass of esophagus    . Nodular prostate    . Osteoarthritis cervical spine    . Palpitations    . Pneumonia    . Pulmonary emboli (CMS HCC)    . Testicular hypofunction    . Wears glasses      Past Surgical History:   Procedure Laterality Date   . COLONOSCOPY     . COLONOSCOPY N/A 11/24/2017    Performed by Vella Raring, MD at [place]   . HX AORTIC VALVE REPLACEMENT  2009   . HX COLECTOMY  2016    FOR OBSTRUCTION   . HX HERNIA REPAIR     . HX TURP     . HX UPPER ENDOSCOPY  04/05/2016   . INCISIONAL HERNIA REPAIR     . VENTRAL HERNIA REPAIR            Family Medical History:     Problem Relation (Age of Onset)    Blood Clots Paternal Grandmother    Coronary Artery Disease Mother, Father    Epilepsy Sister    Heart Attack Father    High Cholesterol Mother, Father    Hypertension (High Blood Pressure) Sister, Sister    Stroke Mother    Thyroid Disease Mother            Social History     Socioeconomic History   . Marital status: Married     Spouse name: Not on file   . Number of children: Not on file   . Years of education: Not on file   . Highest education level: Not on file   Occupational History   . Occupation: disabled  Tobacco Use   . Smoking status: Never Smoker   . Smokeless tobacco: Never Used   Substance and Sexual Activity   . Alcohol use: Yes     Alcohol/week: 1.0 standard drinks     Types: 1 Glasses of wine per week     Binge frequency: Less than monthly   . Drug use: Never   . Sexual activity: Yes     Partners: Female       Current Outpatient Medications:   .  apixaban (ELIQUIS) 5 mg Oral Tablet, Take 2 tabs, PO, BID until 10/19. On 10/20, start taking 1 tab, BID, Disp: 60 Tab, Rfl: 2  .  docusate sodium (COLACE) 100 mg Oral Capsule, Take 100 mg by mouth Twice daily, Disp: , Rfl:   .  ergocalciferol, vitamin D2, (VITAMIN D2 ORAL), Take 1 Tab by mouth Once a day, Disp: , Rfl:   .  levETIRAcetam (KEPPRA) 250 mg Oral Tablet, Take 1 Tab (250 mg total) by mouth Twice daily for 30 days, Disp: 1 Tab, Rfl: 11  .  levothyroxine (SYNTHROID) 100 mcg Oral Tablet, Take 100 mcg by mouth Every morning, Disp: , Rfl:   .  losartan (COZAAR) 50 mg Oral Tablet, Take 50 mg by mouth Once a day, Disp: , Rfl:   .  melatonin 5 mg Oral Tablet, Take 10 mg by mouth Every night , Disp: , Rfl:   .  multivitamin Oral Tablet, Take 1 Tab by mouth Once a day, Disp: , Rfl:   .  OM-3-DHA-EPA-Fish Oil-Vit D3 (FISH OIL-VIT D3) 300-1,000-1,000 mg-mg-unit Oral Capsule, Take 3 Caps by mouth Once a day Dosage 2000, mg fish oil/1000 iu Vitamin d3 , Disp: , Rfl:   .  pantoprazole  (PROTONIX) 40 mg Oral Tablet, Delayed Release (E.C.), Take 40 mg by mouth Once a day , Disp: , Rfl:   .  psyllium (METAMUCIL) Packet, Take 1 Packet by mouth Twice daily, Disp: , Rfl:       Review of Systems    Review of Systems was obtained and is unremarkable except as stated in HPI.    Vital Signs    Most Recent Vitals      Office Visit from 01/19/2018 in Internal Medicine, [place]   Temperature  36.4 C (97.6 F) filed at... 01/19/2018 0908   Heart Rate  70 filed at... 01/19/2018 0908   Respiratory Rate  16 filed at... 01/19/2018 0908   BP (Non-Invasive)  130/72 filed at... 01/19/2018 0908   SpO2  97 % filed at... 01/19/2018 0908   Height  1.753 m (5\' 9" ) filed at... 01/19/2018 0908   Weight  90.2 kg (198 lb 12.8 oz) filed at... 01/19/2018 0908   BMI (Calculated)  29.42 filed at... 01/19/2018 0908   BSA (Calculated)  2.1 filed at... 01/19/2018 0908            Physical Exam    Constitutional 57 y.o.  male, in no acute distress at time of exam.    EENT: Eyes PERRL, EOMI, no obvious sign of infection.  Ears, Nose, Throat: Ears Clear, TM's intact, Nose patent and without significant mucosal abnormality, Oral cavity clear, Pharynx benign.  Neck without mass or adenopathy, trachea midline.    Cardiovascular: regular heart rate and rhythm, + murmur, gallops or rubs.  No edema.    Respiratory Respirations even and unlabored. Lungs clear to auscultation. No extension of expiratory phase.   Gastrointestinal: Soft, non-tender, no distension, no obvious mass,  organomegaly or bruit.   Integumentary: No cyanosis noted.    Psychiatric Alert and oriented x 4 at office visit today.  Affect appropriate to content.  No obvious Psychopathology.    Hematologic/Lymphatic: No Lymphadenopathy, no abnormal bruising.        Impressions/Plan    (I26.99) Bilateral pulmonary embolism (CMS HCC)  Plan: Continue Eliquis at this time. Recheck labs today. We discussed adverse symptoms to monitor for. Will see him back in 2  months for follow up or sooner if any problems.             Follow up    Return in about 2 months (around 03/21/2018).      The patient was given the opportunity to ask questions and those questions were answered to the patient's satisfaction. The patient was encouraged to call with any additional questions or concerns. Instructed patient to call back if symptoms worsen. Patient and provider shared in the decision making process.     Discussed with patient effects and side effects of medications. Medication safety was discussed. A copy of the patient's medication list was printed and given to the patient. A good faith effort was made to reconcile the patient's medications.      Patient was counseled about lifestyle modification including increasing physical activity and proper diet including balanced diet and portion control. Patient also counseled about age-appropriate health maintenance issues including obtaining regular lab work, eye exams, dental exams, screening procedures/tests, vaccination, and appropriate vitamin supplementation. Also instructed to follow-up with specialists as scheduled    Patient was seen independently with co-signing physician available for consult.     Lyndel Safe, PA-C  01/19/2018, 10:46      I agree with above plan. Any exceptions are noted above.    Yolanda Bonine, DO

## 2018-01-22 ENCOUNTER — Telehealth (INDEPENDENT_AMBULATORY_CARE_PROVIDER_SITE_OTHER): Payer: Self-pay | Admitting: Physician Assistant

## 2018-01-22 NOTE — Telephone Encounter (Signed)
-----   Message from Lyndel Safe, [place] sent at 01/22/2018  7:37 AM EST -----  Results reviewed. Please advise patient that labs are improved. Lyndel Safe, PA-C  01/22/2018, 07:37

## 2018-01-22 NOTE — Nursing Note (Signed)
Pt notified and verbalized understanding. Ky Barban, LPN  32/95/1884, 16:60

## 2018-01-25 ENCOUNTER — Encounter (INDEPENDENT_AMBULATORY_CARE_PROVIDER_SITE_OTHER): Payer: Self-pay | Admitting: Surgical

## 2018-01-25 ENCOUNTER — Ambulatory Visit (INDEPENDENT_AMBULATORY_CARE_PROVIDER_SITE_OTHER): Payer: Self-pay | Admitting: Neurology

## 2018-01-25 DIAGNOSIS — I4891 Unspecified atrial fibrillation: Secondary | ICD-10-CM

## 2018-01-25 DIAGNOSIS — I639 Cerebral infarction, unspecified: Secondary | ICD-10-CM

## 2018-01-25 NOTE — Telephone Encounter (Signed)
Regarding: Weimer  ----- Message from Azucena Kuba sent at 01/25/2018 12:36 PM EST -----  Pt's wife is stating the pt has been having some increased issues including: blood clots in left leg and lungs and has had an ischemic stroke. Pt was seen in [facility] in September. Tye [place] can be reached at [phone].

## 2018-01-25 NOTE — Telephone Encounter (Signed)
Can you advise on this for dr.weimer she is out today and Friday

## 2018-01-26 NOTE — Telephone Encounter (Signed)
I talked to wife; Pt has a BRAO on the left eye, occurred during DVT hospitalization and wife also reported transient facial droop  (unknown side).    Plan:  Already on A/C for DVT  Intolerant to statin, hold for now  Carotid, MRI brain, ECHO with shunt  And 30 day ordered. Please let Olivia Mackie know and if she wants me to follow pt or if she will do so.     Thanks,  aka

## 2018-01-26 NOTE — Telephone Encounter (Signed)
HE was discharged in Oct. No report or evidnece of stroke, has PE from DVT, started on A/C which PCP was supposed to be following given his recent GI bleed prior to DVT event.     I am out of town at [place] but happy to call tomorrow. I see he saw Olivia Mackie once in May for "epilepsy". EEG that she ordered was normal and he has not followed up yet. I am happy to call to investigate what the question is.Marland Kitchen?

## 2018-02-06 ENCOUNTER — Encounter (INDEPENDENT_AMBULATORY_CARE_PROVIDER_SITE_OTHER): Payer: Self-pay | Admitting: Internal Medicine

## 2018-02-08 ENCOUNTER — Encounter (INDEPENDENT_AMBULATORY_CARE_PROVIDER_SITE_OTHER): Payer: Self-pay | Admitting: Internal Medicine

## 2018-02-14 ENCOUNTER — Encounter (HOSPITAL_COMMUNITY): Payer: Self-pay

## 2018-02-14 ENCOUNTER — Other Ambulatory Visit (HOSPITAL_COMMUNITY): Payer: Self-pay

## 2018-02-14 ENCOUNTER — Other Ambulatory Visit (INDEPENDENT_AMBULATORY_CARE_PROVIDER_SITE_OTHER): Payer: Self-pay

## 2018-02-14 ENCOUNTER — Ambulatory Visit (INDEPENDENT_AMBULATORY_CARE_PROVIDER_SITE_OTHER): Payer: Self-pay | Admitting: Surgical

## 2018-02-23 ENCOUNTER — Other Ambulatory Visit (INDEPENDENT_AMBULATORY_CARE_PROVIDER_SITE_OTHER): Payer: Self-pay | Admitting: Radiology

## 2018-02-23 DIAGNOSIS — I491 Atrial premature depolarization: Secondary | ICD-10-CM

## 2018-02-24 ENCOUNTER — Ambulatory Visit
Admission: RE | Admit: 2018-02-24 | Discharge: 2018-02-24 | Disposition: A | Discharge status: Home / Self Care | Payer: Medicare PPO | Source: Ambulatory Visit | Attending: Neurology | Admitting: Neurology

## 2018-02-24 DIAGNOSIS — I639 Cerebral infarction, unspecified: Secondary | ICD-10-CM | POA: Insufficient documentation

## 2018-02-24 DIAGNOSIS — I4891 Unspecified atrial fibrillation: Secondary | ICD-10-CM | POA: Insufficient documentation

## 2018-03-16 ENCOUNTER — Other Ambulatory Visit (HOSPITAL_COMMUNITY): Payer: Self-pay

## 2018-03-16 ENCOUNTER — Other Ambulatory Visit (INDEPENDENT_AMBULATORY_CARE_PROVIDER_SITE_OTHER): Payer: Self-pay | Admitting: Radiology

## 2018-03-16 ENCOUNTER — Encounter (HOSPITAL_COMMUNITY): Payer: Self-pay

## 2018-03-21 ENCOUNTER — Ambulatory Visit (INDEPENDENT_AMBULATORY_CARE_PROVIDER_SITE_OTHER): Payer: Self-pay | Admitting: Surgical

## 2018-03-27 ENCOUNTER — Telehealth (HOSPITAL_BASED_OUTPATIENT_CLINIC_OR_DEPARTMENT_OTHER): Payer: Self-pay

## 2018-03-27 NOTE — Telephone Encounter (Signed)
Referral received from from [facility], [place], [phone]. Placed in provides box for review. Geneviene Tesch, MA  03/27/2018, 15:29

## 2018-03-29 ENCOUNTER — Encounter (INDEPENDENT_AMBULATORY_CARE_PROVIDER_SITE_OTHER): Payer: Self-pay | Admitting: Medical

## 2018-04-11 ENCOUNTER — Ambulatory Visit (HOSPITAL_BASED_OUTPATIENT_CLINIC_OR_DEPARTMENT_OTHER)
Admission: RE | Admit: 2018-04-11 | Discharge: 2018-04-11 | Disposition: A | Discharge status: Home / Self Care | Payer: Medicare Other | Source: Ambulatory Visit

## 2018-04-11 ENCOUNTER — Ambulatory Visit (INDEPENDENT_AMBULATORY_CARE_PROVIDER_SITE_OTHER)
Admission: RE | Admit: 2018-04-11 | Discharge: 2018-04-11 | Disposition: A | Discharge status: Home / Self Care | Payer: Medicare Other | Source: Ambulatory Visit

## 2018-04-11 ENCOUNTER — Other Ambulatory Visit: Payer: Self-pay

## 2018-04-11 ENCOUNTER — Ambulatory Visit
Admission: RE | Admit: 2018-04-11 | Discharge: 2018-04-11 | Disposition: A | Discharge status: Home / Self Care | Payer: Medicare Other | Source: Ambulatory Visit | Attending: Neurology | Admitting: Neurology

## 2018-04-11 DIAGNOSIS — I4891 Unspecified atrial fibrillation: Secondary | ICD-10-CM | POA: Insufficient documentation

## 2018-04-11 DIAGNOSIS — I639 Cerebral infarction, unspecified: Secondary | ICD-10-CM

## 2018-04-11 DIAGNOSIS — I6389 Other cerebral infarction: Secondary | ICD-10-CM

## 2018-04-11 DIAGNOSIS — Z8673 Personal history of transient ischemic attack (TIA), and cerebral infarction without residual deficits: Secondary | ICD-10-CM

## 2018-04-11 DIAGNOSIS — I351 Nonrheumatic aortic (valve) insufficiency: Secondary | ICD-10-CM

## 2018-04-11 MED ORDER — PERFLUTREN LIPID MICROSPHERES 1.1 MG/ML INTRAVENOUS SUSPENSION
2.00 mL | INTRAVENOUS | Status: AC
Start: 2018-04-11 — End: 2018-04-11
  Administered 2018-04-11: 2 mL via INTRAVENOUS

## 2018-04-12 ENCOUNTER — Ambulatory Visit (INDEPENDENT_AMBULATORY_CARE_PROVIDER_SITE_OTHER): Payer: Commercial Managed Care - PPO | Admitting: Medical

## 2018-04-12 ENCOUNTER — Encounter (INDEPENDENT_AMBULATORY_CARE_PROVIDER_SITE_OTHER): Payer: Self-pay | Admitting: Medical

## 2018-04-12 VITALS — BP 142/78 | HR 74 | Resp 18 | Ht 69.0 in | Wt 196.3 lb

## 2018-04-12 DIAGNOSIS — I10 Essential (primary) hypertension: Secondary | ICD-10-CM

## 2018-04-12 DIAGNOSIS — I351 Nonrheumatic aortic (valve) insufficiency: Secondary | ICD-10-CM

## 2018-04-12 DIAGNOSIS — Z6828 Body mass index (BMI) 28.0-28.9, adult: Secondary | ICD-10-CM

## 2018-04-12 DIAGNOSIS — I359 Nonrheumatic aortic valve disorder, unspecified: Secondary | ICD-10-CM | POA: Insufficient documentation

## 2018-04-12 DIAGNOSIS — I350 Nonrheumatic aortic (valve) stenosis: Principal | ICD-10-CM

## 2018-04-12 DIAGNOSIS — Z952 Presence of prosthetic heart valve: Secondary | ICD-10-CM

## 2018-04-12 LAB — CAROTID ARTERY DUPLEX
Left CCA dist dias: 15 cm/s
Left CCA dist sys: 88 cm/s
Left CCA mid dias: 14
Left CCA mid dias: 16
Left CCA mid sys: 91
Left CCA prox dias: 13 cm/s
Left CCA prox sys: 100 cm/s
Left Carotid Bubl Sys: 57
Left Carotid Bulb Dias: 8
Left ECA sys: 74 cm/s
Left ICA dist dias: 26 cm/s
Left ICA dist sys: 85 cm/s
Left ICA mid dias: 16 cm/s
Left ICA mid sys: 66 cm/s
Left ICA prox dias: 11 cm/s
Left ICA prox sys: 58 cm/s
Left ICA/CCA sys: 1
Right CCA dist dias: 13 cm/s
Right CCA mid sys: 103
Right CCA prox dias: 13 cm/s
Right CCA prox sys: 93 cm/s
Right Carotid Bulb Dias: 10
Right Carotid Bulb Sys: 75
Right ICA dist dias: 22 cm/s
Right ICA dist sys: 80 cm/s
Right ICA mid dias: 29 cm/s
Right ICA mid sys: 88 cm/s
Right ICA prox dias: 16 cm/s
Right ICA prox sys: 76 cm/s
Right ICA/CCA sys: 0.8 cm/s
Right cca dist sys: 109 cm/s
Right eca sys: 158 cm/s
Right vertebral sys: 48 cm/s

## 2018-04-12 MED ORDER — LOSARTAN 50 MG TABLET
50.0000 mg | ORAL_TABLET | Freq: Every day | ORAL | 3 refills | Status: DC
Start: 2018-04-12 — End: 2020-12-24

## 2018-04-12 MED ORDER — APIXABAN 5 MG TABLET
5.00 mg | ORAL_TABLET | Freq: Two times a day (BID) | ORAL | 3 refills | Status: DC
Start: 2018-04-12 — End: 2018-09-18

## 2018-04-12 NOTE — Progress Notes (Signed)
[facility]  [address]  [place] [address]  Phone: [phone]  Fax: [phone]    Encounter Date: 04/12/2018    Patient ID:  Richard [name]  AST:[id number]    DOB: [birth year]  Age: 58 y.o. male    Subjective:     Chief Complaint   Patient presents with   . Chest Pain     few days ago   . No Complaints   . Follow-up     HPI   Richard [name] is a pleasant 58 y.o. male presenting for follow up.  He has a history of aortic valve replacement approximately 10 years ago for a bicuspid aortic valve with aortic regurgitation with implantation of a bioprosthetic valve.  He was hospitalized in October of 2019 and found to have extensive DVT of the left lower extremity and bilateral pulmonary emboli. He remains on Eliquis without bleeding complications and had a repeat CT of his chest on March 14, 2018 that showed no evidence of PE. He had a Lexiscan MPS in August of 2019 that showed normal myocardial perfusion and EF 70%. A transthoracic echocardiogram completed in August of 2019 showed EF 65-70% with significant aortic gradient with mean gradient 36 mmHg and peak gradient 71.91 mmHg. He had a repeat transthoracic echocardiogram completed yesterday that showed EF 76% with a negative bubble study for intracardiac shunt and moderate aortic regurgitation with a mean PG 51 mmHg and peak PG of 90 mmHg suggestive of bioprosthetic valve degeneration with moderate regurgitation and stenosis of the aortic valve. Today, he reports continued dyspnea on exertion and chest discomfort when he walks approximately 3/4 mile. He notes no significant change within the last 1 year. He denies palpitations, orthopnea, bleeding complications, worsened edema, near syncope and syncope.       Current Outpatient Medications   Medication Sig   . apixaban (ELIQUIS) 5 mg Oral Tablet Take 1 Tab (5 mg total) by mouth Twice daily   . docusate sodium (COLACE) 100 mg Oral Capsule Take 100 mg by mouth Twice daily   .  ergocalciferol, vitamin D2, (VITAMIN D2 ORAL) Take 1 Tab by mouth Once a day   . levETIRAcetam (KEPPRA) 250 mg Oral Tablet Take 1 Tab (250 mg total) by mouth Twice daily for 30 days   . levothyroxine (SYNTHROID) 100 mcg Oral Tablet Take 100 mcg by mouth Every morning   . losartan (COZAAR) 50 mg Oral Tablet Take 1 Tab (50 mg total) by mouth Once a day   . melatonin 5 mg Oral Tablet Take 10 mg by mouth Every night    . multivitamin Oral Tablet Take 1 Tab by mouth Once a day   . OM-3-DHA-EPA-Fish Oil-Vit D3 (FISH OIL-VIT D3) 300-1,000-1,000 mg-mg-unit Oral Capsule Take 3 Caps by mouth Once a day Dosage 2000, mg fish oil/1000 iu Vitamin d3    . pantoprazole (PROTONIX) 40 mg Oral Tablet, Delayed Release (E.C.) Take 40 mg by mouth Once a day    . psyllium (METAMUCIL) Packet Take 1 Packet by mouth Twice daily     Allergies   Allergen Reactions   . Statins-Hmg-Coa Reductase Inhibitors Nausea/ Vomiting     Past Medical History:   Diagnosis Date   . Anxiety    . Arthropathy    . BPH (benign prostatic hyperplasia)    . Congenital anomaly of kidney     HORSESHOE KIDNEY   . Depression    . Diarrhea     CDIFF   .  DVT (deep venous thrombosis) (CMS HCC)    . Epilepsy (CMS [facility])     OVER 10 YEARS SINCE LAST SEIZURE-DR WIEMER   . Esophageal reflux    . GIB (gastrointestinal bleeding) 2017   . Headache    . High blood pressure    . Hx of aortic valve replacement    . Hypercholesterolemia    . Hyperlipidemia    . Hypothyroidism    . Mass of esophagus    . Nodular prostate    . Osteoarthritis cervical spine    . Palpitations    . Pneumonia    . Pulmonary emboli (CMS HCC)    . Testicular hypofunction    . Wears glasses          Past Surgical History:   Procedure Laterality Date   . COLONOSCOPY     . HX AORTIC VALVE REPLACEMENT  2009   . HX COLECTOMY  2016    FOR OBSTRUCTION   . HX HERNIA REPAIR     . HX TURP     . HX UPPER ENDOSCOPY  04/05/2016   . INCISIONAL HERNIA REPAIR     . VENTRAL HERNIA REPAIR           Family Medical History:      Problem Relation (Age of Onset)    Blood Clots Paternal Grandmother    Coronary Artery Disease Mother, Father    Epilepsy Sister    Heart Attack Father    High Cholesterol Mother, Father    Hypertension (High Blood Pressure) Sister, Sister    Stroke Mother    Thyroid Disease Mother            Social History     Tobacco Use   . Smoking status: Never Smoker   . Smokeless tobacco: Never Used   Substance Use Topics   . Alcohol use: Yes     Alcohol/week: 1.0 standard drinks     Types: 1 Glasses of wine per week     Binge frequency: Less than monthly   . Drug use: Never       Review of Systems   Constitutional: Negative for activity change, appetite change, chills, diaphoresis, fatigue, fever and unexpected weight change.   HENT: Negative for hearing loss.    Respiratory: Positive for shortness of breath. Negative for apnea, cough, chest tightness and wheezing.    Cardiovascular: Positive for chest pain and leg swelling. Negative for palpitations.   Gastrointestinal: Negative for blood in stool.   Genitourinary: Negative for hematuria.   Skin: Negative for color change and rash.   Neurological: Negative for dizziness, syncope, weakness and light-headedness.   Hematological: Does not bruise/bleed easily.   All other systems reviewed and are negative.    Objective:   Vitals: BP (!) 142/78   Pulse 74   Resp 18   Ht 1.753 m ('5\' 9"'$ )   Wt 89 kg (196 lb 4.8 oz)   SpO2 99%   BMI 28.99 kg/m         Physical Exam  Vitals signs and nursing note reviewed.   Constitutional:       General: He is not in acute distress.     Appearance: He is well-developed. He is not diaphoretic.   HENT:      Head: Normocephalic and atraumatic.   Eyes:      General: No scleral icterus.  Neck:      Musculoskeletal: Neck supple.      Vascular: No JVD.  Cardiovascular:      Rate and Rhythm: Normal rate and regular rhythm.      Heart sounds: Murmur present. Systolic murmur present with a grade of 4/6. Diastolic (blowing) murmur present. No friction  rub. No gallop.    Pulmonary:      Effort: Pulmonary effort is normal. No respiratory distress.      Breath sounds: Normal breath sounds. No wheezing or rales.   Abdominal:      General: Bowel sounds are normal.      Palpations: Abdomen is soft.   Musculoskeletal:         General: No tenderness.      Right lower leg: Edema (trace) present.      Left lower leg: Edema (trace) present.   Skin:     General: Skin is warm and dry.      Findings: No erythema or rash.   Neurological:      Mental Status: He is alert and oriented to person, place, and time.   Psychiatric:         Behavior: Behavior normal.         Labs 03/23/2018  Sodium 137, potassium 4.5, BUN 13, creatinine 1.2, EGFR greater than 60, glucose 98, AST 30, ALT 21, WBC 6.2, hemoglobin 10.9, hematocrit 37.9, platelets 114    Assessment & Plan:     ENCOUNTER DIAGNOSES     ICD-10-CM   1. Aortic stenosis s/p aortic valve replacement approximately 10 years ago. Transthoracic echocardiogram completed yesterday showed moderate aortic regurgitation with a mean PG 51 mmHg and peak PG 90 mmHg suggestive of bioprosthetic valve degeneration with moderate regurgitation and stenosis of the aortic valve. Will refer him to the Structural Heart Team for consideration for TAVR. We will also place an order to have a right and left cardiac catheterization completed prior to proceeding forward with valve replacement. I35.0   2. History of heart valve replacement Z95.2   3. HTN (hypertension): Stable. Will continue Cozaar 50 mg once daily.  I10   4. Aortic regurgitation: Moderate per recent TTE.  I35.1       Orders Placed This Encounter   . Refer to [facility]   . CASE REQUEST CATH LAB: RIGHT AND LEFT HEART CATH   . apixaban (ELIQUIS) 5 mg Oral Tablet   . losartan (COZAAR) 50 mg Oral Tablet       Return in about 6 weeks (around 05/24/2018), or if symptoms worsen or fail to improve.    Patient was seen jointly with Dr. Lyndee Leo.     Ashlee O'Kernick, PA-C    I have seen and  evaluated the patient jointly with Ashlee O'Kernick, PA-C and agree with her assessment, recommendations and plans. S/p degenerated bioprosthetic aortic valve with severe AS and moderate AI, will refer for structural team for consideration of TAVR.      Zella Ball, MD, [facility], [facility]  Interventional Cardiologist  Associate Professor   [facility] Department of Medicine, Section of Cardiology     04/15/2018 08:37

## 2018-04-12 NOTE — Nursing Note (Signed)
Pt did not have a med list with them at visit - went over list with patient   Advised patient to check AVS list with medicines at home and call if any discrepancies    -Confirmed correct pharmacy with patient for e-scribing   Youlanda Roys, LPN  0/03/4101, 01:31

## 2018-04-13 ENCOUNTER — Telehealth (INDEPENDENT_AMBULATORY_CARE_PROVIDER_SITE_OTHER): Payer: Self-pay | Admitting: Internal Medicine

## 2018-04-13 ENCOUNTER — Encounter (HOSPITAL_COMMUNITY): Payer: Self-pay

## 2018-04-13 NOTE — Patient Instructions (Signed)
Received order for r/l heart cath with Dr. Lyndee Leo. Spoke with Richard [name], scheduled procedure, and gave instructions. Patient is scheduled for procedure on 04/20/18. They are to arrive at [facility] [address] on the 2nd floor to register. They will be notified the day before with instructions on time of arrival and where to go to register. NPO after MN, May take am meds with sip of water, taking the last dose of Eliquis on 04/17/18 and he will need someone with him to drive him home. Richard [name] verbalizes understanding of all instructions given and denies any questions or concerns at this time. Lab slip being sent to [facility]. Please call [phone] for arrival time. Instructions being sent thru my chart.  Vivi Martens RN

## 2018-04-18 ENCOUNTER — Telehealth (HOSPITAL_COMMUNITY): Payer: Self-pay | Admitting: Internal Medicine

## 2018-04-18 ENCOUNTER — Other Ambulatory Visit (HOSPITAL_COMMUNITY): Payer: Self-pay | Admitting: Internal Medicine

## 2018-04-18 DIAGNOSIS — Z952 Presence of prosthetic heart valve: Secondary | ICD-10-CM

## 2018-04-18 DIAGNOSIS — I350 Nonrheumatic aortic (valve) stenosis: Secondary | ICD-10-CM

## 2018-04-18 NOTE — Telephone Encounter (Signed)
Pt wife called to cancel heart cath for 04/20/18  She statts that "he didn't check with me" and they already had plans to go out of town. And that they patient took his Eliquis this morning.     They do not want to reschedule. And will call back to make a new cath appointment in March.

## 2018-04-20 ENCOUNTER — Telehealth (HOSPITAL_COMMUNITY): Payer: Self-pay | Admitting: Interventional Cardiology

## 2018-04-20 NOTE — Telephone Encounter (Signed)
Spoke with Tye Maryland about schedule an appointment with Dr. Glynis Smiles. She stated that they will be out of town and does not wish to schedule until they call back.    Josephine Igo  04/20/2018, 14:35

## 2018-04-24 ENCOUNTER — Other Ambulatory Visit (INDEPENDENT_AMBULATORY_CARE_PROVIDER_SITE_OTHER): Payer: Self-pay

## 2018-04-24 ENCOUNTER — Encounter (INDEPENDENT_AMBULATORY_CARE_PROVIDER_SITE_OTHER): Payer: Self-pay

## 2018-04-26 ENCOUNTER — Other Ambulatory Visit (INDEPENDENT_AMBULATORY_CARE_PROVIDER_SITE_OTHER): Payer: Self-pay | Admitting: Neurology

## 2018-04-26 MED ORDER — LEVETIRACETAM 250 MG TABLET: 250 mg | Tab | Freq: Two times a day (BID) | ORAL | 11 refills | 0 days | Status: AC

## 2018-04-26 NOTE — Nursing Note (Signed)
Please ERX. Insurance requesting 30 day supply    Durene Romans, RN  04/26/2018, 12:12

## 2018-04-26 NOTE — Telephone Encounter (Signed)
-----   Message from Hassie Bruce sent at 04/26/2018  9:36 AM EST -----  Dr Adonis Housekeeper, pt's insurance will no longer pay for the levETIRAcetam (KEPPRA) 250 mg Oral Tablet (Expired) for a 90 day supply.  Needs new script for 30 day supply sent to [facility] [place].    [address] [place] [address]    Phone: [phone] Fax: [phone]    Not a 24 hour pharmacy; exact hours not known.    thanks

## 2018-04-27 ENCOUNTER — Telehealth (HOSPITAL_COMMUNITY): Payer: Self-pay

## 2018-04-27 NOTE — Telephone Encounter (Signed)
Patient stated that he was not ready to schedule appointments yet and to call back in two weeks.    Richard [name]  04/27/2018, 11:17

## 2018-05-08 ENCOUNTER — Telehealth (HOSPITAL_COMMUNITY): Payer: Self-pay | Admitting: Internal Medicine

## 2018-05-08 NOTE — Telephone Encounter (Signed)
RN spoke to wife Tye Maryland to reschedule cardiac cath. Per wife, they wish to go for a 2nd opinion possibly in [place]. Wife stated she would call to schedule cath if they choose to move forward with procedure. RN verbalized understanding.

## 2018-05-21 ENCOUNTER — Encounter (HOSPITAL_COMMUNITY): Payer: Self-pay | Admitting: Internal Medicine

## 2018-05-24 ENCOUNTER — Encounter (INDEPENDENT_AMBULATORY_CARE_PROVIDER_SITE_OTHER): Payer: Self-pay | Admitting: Internal Medicine

## 2018-05-24 NOTE — Nursing Note (Signed)
Zella Ball, MD  Farrel Conners, RN            All are non emergent and elective,   Please let referring physicians know,   Thanks     Zella Ball, MD, [facility], [facility]   Interventional Cardiologist   Associate Professor   [facility] Department of Medicine, Section of Cardiology          From: Farrel Conners, RN   Sent: 05/21/2018 12:59 PM EDT   To: Zella Ball, MD   Subject: procedures                      Hello, you may have heard we have been directed to cancel any non-urgent, elective procedures until 07/20/18. Out of these patients, tell me who I absolutely need to leave on or schedule:     Richard [name] [id number], Rt and Lt Heart Cath     Thanks,   Milon Score

## 2018-05-28 ENCOUNTER — Ambulatory Visit (INDEPENDENT_AMBULATORY_CARE_PROVIDER_SITE_OTHER): Payer: Self-pay | Admitting: Neurology

## 2018-05-28 NOTE — Nursing Note (Signed)
Contacted patient regarding appointment. Offered Televisit via [employer]. Explained to log in about 10 minutes before appt time to check in. Patient verbalized understanding.     Durene Romans, RN  05/28/2018, 10:03

## 2018-05-30 ENCOUNTER — Other Ambulatory Visit: Payer: Self-pay

## 2018-05-31 ENCOUNTER — Ambulatory Visit (INDEPENDENT_AMBULATORY_CARE_PROVIDER_SITE_OTHER): Payer: Self-pay | Admitting: Surgical

## 2018-06-04 ENCOUNTER — Other Ambulatory Visit (INDEPENDENT_AMBULATORY_CARE_PROVIDER_SITE_OTHER): Payer: Self-pay | Admitting: Neurology

## 2018-06-04 ENCOUNTER — Telehealth: Payer: Commercial Managed Care - PPO | Attending: Medical | Admitting: Neurology

## 2018-06-04 ENCOUNTER — Other Ambulatory Visit: Payer: Self-pay

## 2018-06-04 DIAGNOSIS — R569 Unspecified convulsions: Secondary | ICD-10-CM

## 2018-06-04 MED ORDER — LEVETIRACETAM 500 MG TABLET
ORAL_TABLET | ORAL | 4 refills | Status: DC
Start: 2018-06-04 — End: 2018-07-04

## 2018-06-04 MED ORDER — LEVETIRACETAM 500 MG TABLET
500.00 mg | ORAL_TABLET | Freq: Two times a day (BID) | ORAL | 4 refills | Status: AC
Start: 2018-06-04 — End: ?

## 2018-06-04 NOTE — Progress Notes (Signed)
TELEMEDICINE DOCUMENTATION:    Patient Location:  MyChart video visit from home address: [address]  [place] [address]    Patient/family aware of provider location:  yes  Patient/family consent for telemedicine:  yes  Examination observed and performed by:  Janyce Llanos, MD    [name], [name]  [facility] [place]  [name] [address]  Dept: [phone]  Dept Fax: [phone]    Outpatient History and Physical    Date:  06/04/2018  Name: Richard [name]  Age:  58 y.o.  Referring Physician:   Gavin Potters, APRN  [address]  [place]  [name], [name] [address]  Chief Complaint:    History obtained from: 58 year old male last seen by me in May 2019 for epilepsy here for follow up. At that time was on Keppra 500 mg bid. At that visit, I ordered an MRI brain which showed:    No acute intracranial process.  2.  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  3.  Indeterminate left parietal skull lesion which has benign features. CT  is recommended for further characterization.  4.  4 mm pineal gland cyst.  In October, the patient was admitted to the hospital for DVT and PE and the patient was started on Eliquis. The patient called the clinic in October with complaints of transient facial droop and Dr. Rayna Sexton ordered MRI, carotid, ECHO, and 30 day event monitor. TTE showed: transthoracic echocardiogram completed yesterday that showed EF 76% with a negative bubble study for intracardiac shunt and moderate aortic regurgitation with a mean PG 51 mmHg and peak PG of 90 mmHg suggestive of bioprosthetic valve degeneration with moderate regurgitation and stenosis of the aortic valve. His cardiologist sent him to surgery for consideration of TAVR but due to COVID 19, elective procedures have been canceled.   Repeat.MRI brain done 04/11/18 showed:    1. No acute infarcts are appreciated. There is a single remote right  caudate head lacunar type  infarct.    2. Redemonstration without interval change in number or distribution of  findings suggestive of prior microhemorrhages gradient axial images    30 Day event monitor showed NSR. Carotid doppler showed no significant stenosis. At last visit, the plan was to increase Keppra to 500 mg bid but he has been taking 250 mg bid. No seizures since visit. He is concerned about having a seizure because he has upcoming medical procedures.       History:      Examination:    Vitals: There were no vitals taken for this visit.      General: awake  Ophthalmoscopic:no papilledema  Carotids:rrr  Orientation: oriented  Memory: recalls  Attention: follows  Knowledge: knows president  Language: fluent  Speech: no dysarthria  Cranial nerves: intact  CN2:   CN 3,4,6:   CN 5  CN 7  CN 8:   CN 9,10:   CN 11:   CN 12:     Gait:: steady  Coordination:finger to nose intact  Sensory: symmetric  Muscle tone:no spasticity    Motor strength normal except as noted:  Motor - Normal strength throughout except as noted:   Right Left   Deltoid     Biceps     Triceps     Wrist ext     Wrist flex     FDI     APB     Iliopsoas  Quadriceps     Ant tibial     Post tibial     Peronei     Gastroc     Neck flex     Neck ext         Reflexes normal except as noted:    Reflexes Right  Left   brachioradialis     biceps     triceps     Patellar     ankle     plantar         Assessment and Plan  Seizure disorder: he wants to avoid seizures and is on a very low dose of Keppra. Will increase to 500 mg bid. 20 minutes were spent on phone call visit.     Total face-to-face time by staff:   minutes. Greater than 50% of that time was spent on counseling/coordination of care regarding:       Janyce Llanos, MD 06/04/2018, 12:32

## 2018-06-26 ENCOUNTER — Telehealth (HOSPITAL_BASED_OUTPATIENT_CLINIC_OR_DEPARTMENT_OTHER): Payer: Self-pay | Admitting: "Endocrinology

## 2018-07-02 NOTE — Progress Notes (Signed)
Department of Endocrinology  Virtual Video Visit     Name/MRN: Richard [name] [id number] Date of service: 07/04/2018   Age/DOB: 58 y.o., 1960-05-22 Chief Complaint: Adrenal Adenoma     VIRTUAL VISIT DOCUMENTATION:  Patient Location: [address]  [place] [address]  Patient/family aware of provider location:  yes  Patient/family consent for telemedicine:  yes  Examination observed and performed by:  Telford Nab, MD    HISTORY OF PRESENTING ILLNESS:     [name] is a 58 y.o. 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 Patient's wife is also on the virtual video visit and notes that patient was recently hospitalized for a GI bleed a few months ago. He has history of bovine aortic valve replacement and is on Eliquis for DVT/PEs. He states they are evaluating him for a repeat aortic valve replacement as the other one is wearing out. He his wife states that his GI bleeds have been from diverticulosis.    Patient's wife states that he was previously on losartan and amlodipine for hypertension but since his hospitalization in October 2019, he has been off all blood pressure medications. Patient states that his blood pressure has been good recently. His wife notes that he had hypokalemia during his hospitalization but he does not take potassium supplements regularly at home. Patient denies diabetes mellitus, muscle wasting, facial plethora, large purple abdominal striae, facial or body hair changes, difficulty walking or standing, change in appetite, change in weight, sweats, steroid use, history of radiation to abdomen other than CT scans, nausea, vomiting, diarrhea, dizziness, abdominal pain, or  salt cravings. He reports constipation for which he takes Colace. He has easy bruising and bleeding since being on Eliquis. He reports occasional palpitations and rare headaches which he attributes to his sinuses. He has had surgeries previously but did not have any complications during the surgery such as hypertension, MI, or stroke. He denies any family history of adrenal, pituitary, pancreas, or calcium disorders. He states that his mother had a goiter and passed away at age 29 from a brain aneurysm.  He has history of hypothyroidism and takes Synthroid 100 mcg every morning.  He is also taking iron supplements twice a day for anemia from the recent GI bleed. He states that he takes his Synthroid with his other pills in the morning.     MEDICAL HISTORY:   PROBLEM LIST:  Patient Active Problem List    Diagnosis   . Aortic valve disease   . Bilateral pulmonary embolism (CMS HCC)   . DVT (deep venous thrombosis) (CMS HCC)   . Pulmonary embolism (CMS HCC)   . Acute GI bleeding   . History of stress test   . H/O echocardiogram   . S/P AVR   . Essential hypertension   . Hyperlipidemia   . Epilepsy (CMS [place])   . Congenital anomaly of kidney   . Mass of esophagus   . Hypothyroidism        PAST MEDICAL & SURGICAL HISTORIES:   Past Medical History:   Diagnosis Date   . Anxiety    . Arthropathy    . BPH (benign prostatic hyperplasia)    . Clostridium difficile diarrhea    .  Depression    . Diverticulosis    . DVT (deep venous thrombosis) (CMS HCC)    . Epilepsy (CMS [place])     OVER 10 YEARS SINCE LAST SEIZURE-DR WIEMER   . Esophageal reflux    . GIB (gastrointestinal bleeding) 2017   . Horseshoe kidney    . Hx of aortic valve replacement    . Hyperlipidemia    . Hypertension    . Hypothyroidism    . Mass of esophagus    . Nodular prostate    . Osteoarthritis cervical spine    . Pulmonary emboli (CMS HCC)    . Testicular hypofunction    . Wears glasses         Past Surgical History:   Procedure Laterality Date   . COLONOSCOPY      . HX AORTIC VALVE REPLACEMENT  2009   . HX COLECTOMY  2016    FOR OBSTRUCTION   . HX HERNIA REPAIR     . HX TURP     . HX UPPER ENDOSCOPY  04/05/2016   . INCISIONAL HERNIA REPAIR     . VENTRAL HERNIA REPAIR              HOME MEDICATIONS:  Current Outpatient Medications   Medication Sig   . apixaban (ELIQUIS) 5 mg Oral Tablet Take 1 Tab (5 mg total) by mouth Twice daily   . dexAMETHasone (DECADRON) 1 mg Oral Tablet Take 1 Tab (1 mg total) by mouth One time for 1 dose At 11 PM on night before lab then get cortisol level drawn at 8 AM the following day   . docusate sodium (COLACE) 100 mg Oral Capsule Take 100 mg by mouth Twice daily   . ergocalciferol, vitamin D2, (VITAMIN D2 ORAL) Take 1 Tab by mouth Once a day   . levETIRAcetam (KEPPRA) 500 mg Oral Tablet Take 1 Tab (500 mg total) by mouth Twice daily   . levothyroxine (SYNTHROID) 100 mcg Oral Tablet Take 100 mcg by mouth Every morning   . losartan (COZAAR) 50 mg Oral Tablet Take 1 Tab (50 mg total) by mouth Once a day   . melatonin 5 mg Oral Tablet Take 10 mg by mouth Every night    . multivitamin Oral Tablet Take 1 Tab by mouth Once a day         ALLERGIES:  Allergies   Allergen Reactions   . Statins-Hmg-Coa Reductase Inhibitors Nausea/ Vomiting        FAMILY HISTORY:  Family Medical History:     Problem Relation (Age of Onset)    Blood Clots Paternal Grandmother    Cerebral Aneurysm Mother    Coronary Artery Disease Mother, Father    Epilepsy Sister    Goiter Mother    Heart Attack Father    High Cholesterol Mother, Father    Hypertension (High Blood Pressure) Sister, Sister    Stroke Mother      No family history of adrenal, pituitary, calcium, or pancreas tumors. No family history of thyroid cancer. Mother had a goiter.    SOCIAL HISTORY:  Social History     Tobacco Use   . Smoking status: Never Smoker   . Smokeless tobacco: Never Used   Substance Use Topics   . Alcohol use: Yes     Alcohol/week: 1.0 standard drinks     Types: 1 Glasses of wine per week      Binge frequency: Less than monthly   . Drug use: Never  REVIEW OF SYSTEMS:   Positive for easy bruising, bleeding, occasional palpitations, constipation, difficulty losing weight, leg cramps worse at night.  ROS: All systems reviewed and negative except for as above.    EXAMINATION:   Vitals: There were no vitals taken for this visit.  Full Exam Limited as Encounter is Virtual  General: Well appearing male in no acute distress.  Psychiatric: Normal mood and affect  Neuro: Alert and oriented x 3. Speech fluent. Cranial nerves grossly II-XII intact.   HEENT: Head normocephalic, atraumatic. PERRLA, EOMI, conjunctiva clear. No proptosis.  Thyroid/Neck: Trachea midline.   Lungs: Normal respiratory effort.   Extremities: No edema or erythema noted.  Skin: No visible rashes.    DATA REVIEWED:   I have reviewed previous labs, tests, imaging, and notes.    -CT chest with IV contrast 03/14/2018 from [facility]:  Severe diffuse thoracic kyphosis is noted and there are multilevel degenerative changes of the spine. There is partial anterior fusion of thoracic vertebral bodies and osteophytes. There is a chronic anterior right chest wall deformity and hardware is noted extending towards the midline. The heart is enlarged and there is a 7 cm hiatal hernia. There is a 1.5 cm left adrenal nodule. Suggest follow-up. There is no acute thoracic aortic abnormality or pulmonary embolus. There is a 1 cm calcified granuloma in the inferior right lower lobe contacting the pleura. No infiltrate of pleural fluid is identified. Part of the anterior right upper lobe was herniated beyond the thorax adjacent to the chest wall hardware, series 5, image 38 for example.   Impression:  1. Negative for pulmonary embolus  2. Postoperative changes at the right anterior chest wall with anterior herniation of the right lung  3. Left adrenal nodule  4. Multiple additional findings as above    -Reviewed outside labs 03/14/2018:  Sodium 137,  potassium 4.5, chloride 105, bicarb 25, calcium 9.1, glucose 105, creatinine 1.4, alk phos 96, albumin 4.1, GFR 52, AST 19, ALT 24, TSH 0.473    Last BMP  (Last result in the past 2 years)      Na   K   Cl   CO2   BUN   Cr   Calcium   Glucose   Glucose-Fasting        01/19/18 1011 137 3.8 105 27 14 1.20 9.0 107         Last Hepatic Panel  (Last result in the past 2 years)      Albumin   Total PTN   Total Bili   Direct Bili   Ast/SGOT   Alt/SGPT   Alk Phos        11/24/17 1437 2.9 5.2 0.3 0._0 77         Component  Ref Range & Units 11/22/2017   TESTOSTERONE, FREE, NG/DL  3.87 - 14.7 ng/dL 7.79    Comment:    -------------------ADDITIONAL INFORMATION-------------------   Testing performed by [facility].   This test was developed and its performance characteristics   determined by [facility] in a manner consistent with CLIA   requirements. This test has not been cleared or approved by   the U.S. Food and Drug Administration.   TESTOSTERONE, TOTAL, SERUM  240 - 950 ng/dL 433       Ref. Range 07/10/2017 08:28 11/22/2017 09:53   TSH Latest Ref Range: 0.450 - 5.330 uIU/mL 7.388 (H) 0.247 (L)   THYROXINE, FREE (FREE T4) Latest Ref Range: 0.61 -  1.12 ng/dL 0.80 1.16 (H)       ASSESSMENT & PLAN:     Orders Placed This Encounter   . CT ABDOMEN PELVIS W/WO IV CONTRAST   . METANEPHRINES, FRACTIONATED, FREE, PLASMA   . Cortisol   . ALDOSTERONE, SERUM   . RENIN ACTIVITY, PLASMA   . DEHYDROEPIANDROSTERONE SULFATE (DHEA-S), SERUM   . ADRENOCORTICOTROPIC HORMONE   . THYROID STIMULATING HORMONE (SENSITIVE TSH)   . Cbc   . Basic Metabolic Panel   . dexAMETHasone (DECADRON) 1 mg Oral Tablet       Richard [name] is a 58 y.o. 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    Incidental Adrenal Nodule  -1.5 cm left adrenal nodule noted on CT-PE from [facility] in 03/2018  -Will obtain dedicated CT adrenal protocol with HU and contrast washout around 6 months from prior scan (around 08/2018)  -Will check ACTH level then do 1 mg dexamethasone suppression test with 8 AM cortisol to rule out Cushing's syndrome  -Will check plasma metanephrines to rule out pheochromocytoma  -Will check aldosterone and renin to rule out primary hyperaldosteronism. Ordered BMP to evaluate for hypokalemia  -Ordered DHEA-S to evaluate for adrenocortical carcinoma    Hypothyroidism  -Will check TSH. Continue Synthroid 100 mcg qAM and will adjust based on TSH level  -Instructed patient to take on an empty stomach in the morning and wait 30-60 minutes before taking other pills or food. Instructed patient that if taking iron or calcium supplements, to take those in the evening as these can bind with thyroid hormone and prevent proper absorption. Since patient is taking iron BID, instructed him to take Synthroid in the morning around 6 AM when he first wakes up, wait around 4 hours to take iron around 10 AM then second dose at dinner or bedtime    Anemia  -Patient had a recent GI bleed. Will check CBC/diff and fax to PCP if abnormal    Follow up in 6 months    35 minutes were spent face-to-face with patient through virtual video visit and >50% of the time was spent coordinating care and/or counseling patient on the above medical issues.    Keith Rake, MD, MPH 07/04/2018, 12:31        07/04/2018  I saw and examined the patient.  I reviewed the fellow's note.  I agree with the findings and plan of care as documented in the fellow's note.  Any exceptions/additions are edited/noted.      Rose Phi, MD   Assistant Professor    Endocrinology and [facility] Medicine  [phone]

## 2018-07-04 ENCOUNTER — Telehealth (HOSPITAL_BASED_OUTPATIENT_CLINIC_OR_DEPARTMENT_OTHER): Payer: Commercial Managed Care - PPO | Admitting: "Endocrinology

## 2018-07-04 ENCOUNTER — Encounter (HOSPITAL_BASED_OUTPATIENT_CLINIC_OR_DEPARTMENT_OTHER): Payer: Self-pay | Admitting: "Endocrinology

## 2018-07-04 ENCOUNTER — Other Ambulatory Visit: Payer: Self-pay

## 2018-07-04 DIAGNOSIS — D649 Anemia, unspecified: Secondary | ICD-10-CM

## 2018-07-04 DIAGNOSIS — E278 Other specified disorders of adrenal gland: Secondary | ICD-10-CM

## 2018-07-04 DIAGNOSIS — E039 Hypothyroidism, unspecified: Secondary | ICD-10-CM

## 2018-07-04 DIAGNOSIS — E279 Disorder of adrenal gland, unspecified: Secondary | ICD-10-CM

## 2018-07-04 MED ORDER — DEXAMETHASONE 1 MG TABLET
1.0000 mg | ORAL_TABLET | Freq: Once | ORAL | 0 refills | Status: AC
Start: 2018-07-04 — End: 2018-07-04

## 2018-07-16 ENCOUNTER — Ambulatory Visit (HOSPITAL_BASED_OUTPATIENT_CLINIC_OR_DEPARTMENT_OTHER): Payer: Self-pay | Admitting: "Endocrinology

## 2018-07-16 NOTE — Telephone Encounter (Signed)
Regarding: call request  ----- Message from Varney Daily sent at 07/16/2018  8:17 AM EDT -----  Telford Nab, MD    Patient called in needing to discuss his lab work orders with the clinic, patient has questions, please advise

## 2018-07-16 NOTE — Telephone Encounter (Signed)
Attempted to contact patient regarding below message, left message to call clinic back. Maylie Ashton, RN  07/16/2018, 08:34

## 2018-07-17 NOTE — Telephone Encounter (Signed)
Message from Benjamine Mola sent at 07/16/2018 4:07 PM EDT     Summary: Call request    Barghouthi, Dorian Furnace, MD    Pt's wife called in stating that on MyChart it showed the pt had an appointment today at 4p for a scan, but it is not on Epic.     She was also wanting to see if the labs the pt needs to get done need to be fasting and to see if they could get them done at [facility] since it is closer to home. Please call to advise. Thank you!              Attempted patient contact regarding  below message, left message to call clinic back. Evalina Tabak, RN  07/17/2018, 08:26

## 2018-07-17 NOTE — Telephone Encounter (Signed)
Sounds good. Thank you!    Richard [name]

## 2018-07-17 NOTE — Telephone Encounter (Signed)
Barghouthi, Dorian Furnace, MD  Emmery Seiler, RN   Caller: Unspecified (Yesterday, 8:16 AM)            I sent an in-depth mychart message on the day of his appointment with the lab instructions.     Spoke with patient's wife regarding MyChart message from provider with instructions, she verbalized understanding. Erian Rosengren, RN  07/17/2018, 09:03

## 2018-07-17 NOTE — Telephone Encounter (Signed)
Message from Minna Merritts sent at 07/17/2018 8:31 AM EDT     Summary: Call request    Pts wife is returning Myranda Pavone's call.Please call pts wife back at [phone]    ----- Message from Benjamine Mola sent at 07/16/2018 4:07 PM EDT -----  Telford Nab, MD    Pt's wife called in stating that on MyChart it showed the pt had an appointment today at 4p for a scan, but it is not on Epic.     She was also wanting to see if the labs the pt needs to get done need to be fasting and to see if they could get them done at [facility] since it is closer to home. Please call to advise. Thank you!              Spoke with patient's wife regarding lab orders. Patient is wanting to get labs drawn at [place] (orders in Epic). Patient's wife stated they would like MyChart message sent with dates and times that patient is to get labs drawn. Routing to provider to advise. Trystin Terhune, RN  07/17/2018, 08:43

## 2018-07-19 ENCOUNTER — Other Ambulatory Visit (HOSPITAL_BASED_OUTPATIENT_CLINIC_OR_DEPARTMENT_OTHER): Payer: Self-pay | Admitting: "Endocrinology

## 2018-07-19 ENCOUNTER — Other Ambulatory Visit: Payer: Self-pay

## 2018-07-19 ENCOUNTER — Ambulatory Visit: Payer: Commercial Managed Care - PPO | Attending: INTERNAL MEDICINE-ENDOCRINOLOGY-DIABETES AND METABOLISM

## 2018-07-19 DIAGNOSIS — E039 Hypothyroidism, unspecified: Secondary | ICD-10-CM | POA: Insufficient documentation

## 2018-07-19 DIAGNOSIS — E279 Disorder of adrenal gland, unspecified: Secondary | ICD-10-CM | POA: Insufficient documentation

## 2018-07-19 DIAGNOSIS — D649 Anemia, unspecified: Secondary | ICD-10-CM | POA: Insufficient documentation

## 2018-07-19 DIAGNOSIS — E278 Other specified disorders of adrenal gland: Secondary | ICD-10-CM

## 2018-07-19 LAB — BASIC METABOLIC PANEL
ANION GAP: 8 mmol/L (ref 5–15)
BUN/CREA RATIO: 15 (ref 6–20)
BUN: 18 mg/dL (ref 8–26)
CALCIUM: 9.3 mg/dL (ref 8.9–10.3)
CHLORIDE: 104 mmol/L (ref 101–111)
CO2 TOTAL: 27 mmol/L (ref 22–32)
CREATININE: 1.23 mg/dL (ref 0.90–1.30)
ESTIMATED GFR: >60 mL/min/1.73mˆ2 (ref >60)
GLUCOSE: 90 mg/dL (ref 70–110)
POTASSIUM: 5.1 mmol/L (ref 3.6–5.1)
SODIUM: 139 mmol/L (ref 136–144)

## 2018-07-19 LAB — CBC
HCT: 44.7 % (ref 38.9–52.0)
HGB: 13.8 g/dL (ref 13.4–17.5)
MCH: 28.4 pg (ref 26.0–32.0)
MCHC: 30.9 g/dL — ABNORMAL LOW (ref 31.0–35.5)
MCV: 92 fL (ref 78.0–100.0)
MPV: 12.1 fL (ref 8.7–12.5)
PLATELETS: 115 x10ˆ3/uL — ABNORMAL LOW (ref 150–400)
RBC: 4.86 x10ˆ6/uL (ref 4.50–6.10)
RDW-CV: 17.3 % — ABNORMAL HIGH (ref 11.5–15.5)
WBC: 5.9 x10ˆ3/uL (ref 3.7–11.0)

## 2018-07-19 LAB — THYROID STIMULATING HORMONE (SENSITIVE TSH): TSH: 7.379 u[IU]/mL — ABNORMAL HIGH (ref 0.450–5.330)

## 2018-07-19 MED ORDER — LEVOTHYROXINE 112 MCG TABLET
112.00 ug | ORAL_TABLET | Freq: Every morning | ORAL | 2 refills | Status: AC
Start: 2018-07-19 — End: ?

## 2018-07-20 ENCOUNTER — Ambulatory Visit: Payer: Commercial Managed Care - PPO | Attending: INTERNAL MEDICINE-ENDOCRINOLOGY-DIABETES AND METABOLISM

## 2018-07-20 DIAGNOSIS — E279 Disorder of adrenal gland, unspecified: Secondary | ICD-10-CM | POA: Insufficient documentation

## 2018-07-20 DIAGNOSIS — E278 Other specified disorders of adrenal gland: Secondary | ICD-10-CM

## 2018-07-20 LAB — DEHYDROEPIANDROSTERONE SULFATE (DHEA-S), SERUM: DEHYDROEPIANDROSTERONE SULFATE, S: 42 ug/dL (ref 20–299)

## 2018-07-21 ENCOUNTER — Other Ambulatory Visit (HOSPITAL_BASED_OUTPATIENT_CLINIC_OR_DEPARTMENT_OTHER): Payer: Self-pay | Admitting: "Endocrinology

## 2018-07-21 DIAGNOSIS — E278 Other specified disorders of adrenal gland: Secondary | ICD-10-CM

## 2018-07-21 DIAGNOSIS — E279 Disorder of adrenal gland, unspecified: Principal | ICD-10-CM

## 2018-07-21 LAB — METANEPHRINES, FRACTIONATED, FREE, PLASMA
METANEPHRINE, FREE: <0.2 nmol/L (ref <0.50)
NORMETANEPHRINE, FREE: 0.62 nmol/L (ref <0.90)

## 2018-07-21 LAB — CORTISOL, PLASMA OR SERUM: CORTISOL: 2 ug/dL

## 2018-07-21 MED ORDER — DEXAMETHASONE 1 MG TABLET
1.0000 mg | ORAL_TABLET | Freq: Once | ORAL | 0 refills | Status: AC
Start: 2018-07-21 — End: 2018-07-21

## 2018-07-23 LAB — ALDOSTERONE, SERUM: ALDOSTERONE, SERUM: <4 ng/dL (ref <=21)

## 2018-07-23 LAB — RENIN ACTIVITY, PLASMA: RENIN ACTIVITY, PLASMA: <0.6 ng/mL/h

## 2018-07-24 ENCOUNTER — Ambulatory Visit (HOSPITAL_BASED_OUTPATIENT_CLINIC_OR_DEPARTMENT_OTHER): Payer: Self-pay | Admitting: "Endocrinology

## 2018-07-24 LAB — ADRENOCORTICOTROPIC HORMONE: ACTH: 31.8 pg/mL (ref 7.0–69.0)

## 2018-07-24 NOTE — Telephone Encounter (Signed)
-----   Message from Katha Cabal sent at 07/24/2018 10:51 AM EDT -----  Telford Nab, MD    Pt wife calling to get test results.  Was told it was in [place] but she was unable to see this. Please call pt to advise.

## 2018-07-24 NOTE — Telephone Encounter (Signed)
Patient & wife Richard [name] both was on the phone when I read results of labs from Provider. Placing copies into today's mail to  patient's home address at this time.  Merleen Milliner, [place]  07/24/2018, 11:34

## 2018-07-24 NOTE — Telephone Encounter (Signed)
Attempted patient contact regarding provider's below message, left message to call clinic back. Orlander Norwood, RN  07/24/2018, 11:05

## 2018-08-02 ENCOUNTER — Telehealth (HOSPITAL_COMMUNITY): Payer: Self-pay | Admitting: Internal Medicine

## 2018-08-03 ENCOUNTER — Other Ambulatory Visit: Payer: Self-pay

## 2018-08-03 ENCOUNTER — Ambulatory Visit (INDEPENDENT_AMBULATORY_CARE_PROVIDER_SITE_OTHER): Payer: Commercial Managed Care - PPO

## 2018-08-06 ENCOUNTER — Ambulatory Visit: Payer: Medicare Other | Attending: INTERNAL MEDICINE-ENDOCRINOLOGY-DIABETES AND METABOLISM

## 2018-08-06 ENCOUNTER — Other Ambulatory Visit: Payer: Self-pay

## 2018-08-06 DIAGNOSIS — E279 Disorder of adrenal gland, unspecified: Secondary | ICD-10-CM | POA: Insufficient documentation

## 2018-08-06 DIAGNOSIS — E278 Other specified disorders of adrenal gland: Secondary | ICD-10-CM

## 2018-08-06 LAB — CORTISOL, PLASMA OR SERUM
CORTISOL: 15.8 ug/dL
CORTISOL: 15.8 ug/dL

## 2018-08-08 ENCOUNTER — Other Ambulatory Visit (HOSPITAL_BASED_OUTPATIENT_CLINIC_OR_DEPARTMENT_OTHER): Payer: Self-pay | Admitting: "Endocrinology

## 2018-08-08 DIAGNOSIS — E278 Other specified disorders of adrenal gland: Secondary | ICD-10-CM

## 2018-08-08 DIAGNOSIS — E279 Disorder of adrenal gland, unspecified: Principal | ICD-10-CM

## 2018-08-10 ENCOUNTER — Ambulatory Visit: Payer: Commercial Managed Care - PPO | Attending: INTERNAL MEDICINE-ENDOCRINOLOGY-DIABETES AND METABOLISM

## 2018-08-10 ENCOUNTER — Other Ambulatory Visit: Payer: Self-pay

## 2018-08-10 DIAGNOSIS — E279 Disorder of adrenal gland, unspecified: Principal | ICD-10-CM | POA: Insufficient documentation

## 2018-08-10 LAB — CREATININE URINE, TIMED
CREATININE TIMED URINE: 85.84 mg/dL
CREATININE/SPECIMEN: 1459 mg/{specimen} (ref 600–2500)
URINE COLLECTION DURATION: 24 h
URINE COLLECTION DURATION: 24 h
URINE VOLUME: 1700 mL

## 2018-08-11 LAB — DEXAMETHASONE, SERUM: DEXAMETHASONE, SERUM: <30 ng/dL

## 2018-08-13 ENCOUNTER — Other Ambulatory Visit: Payer: Self-pay

## 2018-08-13 ENCOUNTER — Ambulatory Visit: Payer: Commercial Managed Care - PPO | Attending: INTERNAL MEDICINE-ENDOCRINOLOGY-DIABETES AND METABOLISM

## 2018-08-13 DIAGNOSIS — E279 Disorder of adrenal gland, unspecified: Secondary | ICD-10-CM | POA: Insufficient documentation

## 2018-08-13 DIAGNOSIS — E278 Other specified disorders of adrenal gland: Secondary | ICD-10-CM

## 2018-08-13 LAB — CORTISOL, PLASMA OR SERUM: CORTISOL: 2.1 ug/dL

## 2018-08-14 ENCOUNTER — Telehealth (HOSPITAL_BASED_OUTPATIENT_CLINIC_OR_DEPARTMENT_OTHER): Payer: Self-pay | Admitting: "Endocrinology

## 2018-08-14 ENCOUNTER — Other Ambulatory Visit (HOSPITAL_BASED_OUTPATIENT_CLINIC_OR_DEPARTMENT_OTHER): Payer: Self-pay | Admitting: "Endocrinology

## 2018-08-14 DIAGNOSIS — E279 Disorder of adrenal gland, unspecified: Principal | ICD-10-CM

## 2018-08-14 DIAGNOSIS — E278 Other specified disorders of adrenal gland: Secondary | ICD-10-CM

## 2018-08-14 NOTE — Telephone Encounter (Signed)
Called and spoke with [facility] in the lab at [facility] in [place]. She stated she can see the new orders from provider but unfortunately, they cannot be added on. She stated it would require a different tube for the dexamethasone and then no, to the 24 hour urine cortisol as well. Will route back to provider to advise. Loyce Dys  08/14/2018, 14:15

## 2018-08-14 NOTE — Telephone Encounter (Signed)
Thank you for checking again. Please contact the patient and let him know that the 24-hour urine collection will need to be redone as they did not run the lab on the first sample. If he wants to wait a few weeks then repeat the collection then that would be okay. Let him know that I will let him know when the saliva cortisol test results come back.     Keith Rake, MD, MPH 08/14/2018, 15:04

## 2018-08-14 NOTE — Telephone Encounter (Signed)
-----   Message from Telford Nab, MD sent at 08/14/2018  7:59 AM EDT -----  Nurses, can you call over to the lab (I think [facility]) where he got these done? They collected the 24-hour urine creatinine but I don't see the 24-hour urine cortisol in process for some reason. Can you make sure that they are running it? Also, can you find out if they drew the dexamethasone level with his most recent cortisol level? It isn't looking like they did.     Mychart message sent:    Mertha Baars,    Your cortisol is still a little higher than we would expect after taking dexamethasone. It is 2.1 and goal is usually less than 1.8. Unfortunately, the lab did not draw the dexamethasone level at the same time as the cortisol level so we can't confirm that your body absorbed the pill okay. I can see that the cheek swabs (salivary cortisols) are in process so I will wait and see what those results show. I am also waiting on the 24-hour urine cortisol as well. I saw that they ran the 24-hour urine creatinine but that the 24-hour urine cortisol still says it has not been collected. I will have one of our nurses call the lab over there and see what's going on and if they just haven't put it in yet or if they didn't use the sample you provided correctly.     I will keep you updated when I know more.    Sincerely,    Keith Rake, MD, MPH 08/14/2018, 07:58

## 2018-08-14 NOTE — Telephone Encounter (Signed)
Thank you for calling! They were both ordered at the same time as all the other labs. Please see if they can add the 24-hour urine cortisol lab to the existing 24-hour urine creatinine specimen and if they can add the dexamethasone level to the existing cortisol level. I will reorder them now in case they weren't able to see the other ones although I'm not sure why they were able to see half the labs and not the others.     Keith Rake, MD, MPH 08/14/2018, 13:59

## 2018-08-14 NOTE — Telephone Encounter (Signed)
Called patient because he had not read his MyChart. Advised of message below from provider. He verbalized understanding and denied any questions. I called the lab at [facility] in [place], [place] and spoke with Dominica. Angela Nevin stated that they did not draw a dexamethasone or do the 24 hour urine cortisol. She said they did not see where that was ordered. Routing to provider to update. Loyce Dys  08/14/2018, 13:53

## 2018-08-15 LAB — CORTISOL, SALIVA
MIDNIGHT CORTISOL: <50 ng/dL (ref <100)
MIDNIGHT CORTISOL: <50 ng/dL (ref <100)

## 2018-08-17 ENCOUNTER — Telehealth (HOSPITAL_BASED_OUTPATIENT_CLINIC_OR_DEPARTMENT_OTHER): Payer: Self-pay | Admitting: "Endocrinology

## 2018-08-17 NOTE — Telephone Encounter (Signed)
Sent MyChart message to patient. Will also mail letter to address in chart. Loyce Dys  08/17/2018, 14:20

## 2018-08-17 NOTE — Telephone Encounter (Signed)
-----   Message from Telford Nab, MD sent at 08/16/2018  8:15 AM EDT -----  Mychart message sent:    Richard [name],    Both of the saliva cortisols were negative which is good. So far we have two negative tests for excess cortisol (the salivary cortisol levels) and two mildly positive tests for excess cortisol (the dexamethasone suppression tests) however unfortunately the lab did not draw the dexamethasone levels with the second cortisol level so we are not sure if your body properly absorbed the medication. In addition, the lab did not run the 24-hour urine cortisol. We called them and asked if they could run the lab on the urine sample you had already turned in and they said they couldn't because it had been a few days. At this point, the labs are pointing to either no excess cortisol (no Cushing's syndrome) or a very mild excess cortisol production (called subclinical Cushing's syndrome). I see that your CT scan is scheduled for 08/27/2018. I recommend we wait to see what that adrenal protocol CT scan shows to see if we need to repeat any of the tests. We will hold off on repeating anything until  after the CT scan.     Please let me know if you have any questions,    Richard Rake, MD, MPH 08/16/2018, 08:14

## 2018-08-20 ENCOUNTER — Other Ambulatory Visit (HOSPITAL_COMMUNITY): Payer: Self-pay

## 2018-08-27 ENCOUNTER — Other Ambulatory Visit (HOSPITAL_COMMUNITY): Payer: Self-pay

## 2018-09-11 ENCOUNTER — Encounter (HOSPITAL_BASED_OUTPATIENT_CLINIC_OR_DEPARTMENT_OTHER): Payer: Self-pay | Admitting: "Endocrinology

## 2018-09-12 ENCOUNTER — Ambulatory Visit (HOSPITAL_BASED_OUTPATIENT_CLINIC_OR_DEPARTMENT_OTHER): Payer: Self-pay | Admitting: "Endocrinology

## 2018-09-12 ENCOUNTER — Other Ambulatory Visit (HOSPITAL_BASED_OUTPATIENT_CLINIC_OR_DEPARTMENT_OTHER): Payer: Self-pay | Admitting: "Endocrinology

## 2018-09-12 DIAGNOSIS — E278 Other specified disorders of adrenal gland: Secondary | ICD-10-CM

## 2018-09-12 MED ORDER — DEXAMETHASONE 1 MG TABLET
2.0000 mg | ORAL_TABLET | Freq: Once | ORAL | 0 refills | Status: AC
Start: 2018-09-12 — End: 2018-09-12

## 2018-09-12 NOTE — Telephone Encounter (Signed)
Per last MyChart message---On Monday (July 13): You will finish the 24-hour urine collection in that morning. At 11 PM on Monday night (09/17/2018), you will take 2 mg dexamethasone  --On Tuesday morning (July 14): Go to the lab and get your labs drawn at 8 AM. Make sure to tell them they need to draw the cortisol level AND the dexamethasone level that is in the system. Provided clarification for RX to pharmacy. Toleen Lachapelle, RN  09/12/2018, 10:40

## 2018-09-12 NOTE — Telephone Encounter (Signed)
Regarding: verify quantity  ----- Message from Angelique Holm sent at 09/12/2018 10:26 AM EDT -----  Telford Nab, MD    Pharmacy needs to verify quantity.       dexAMETHasone (DECADRON) 1 mg Oral Tablet, Take 2 Tabs (2 mg total) by mouth One time for 1 dose At 11 PM then get 8 AM cortisol and dexamethasone level labs the following day, Disp: 1 Tab, Rfl: 0

## 2018-09-14 ENCOUNTER — Encounter (HOSPITAL_COMMUNITY): Payer: Self-pay

## 2018-09-18 ENCOUNTER — Other Ambulatory Visit (INDEPENDENT_AMBULATORY_CARE_PROVIDER_SITE_OTHER): Payer: Self-pay | Admitting: Medical

## 2018-09-18 NOTE — Telephone Encounter (Signed)
Pt was a no show to last appointment. Needs f/u please

## 2018-09-21 ENCOUNTER — Ambulatory Visit: Payer: Commercial Managed Care - PPO | Attending: "Endocrinology

## 2018-09-21 ENCOUNTER — Other Ambulatory Visit: Payer: Self-pay

## 2018-09-21 DIAGNOSIS — E278 Other specified disorders of adrenal gland: Secondary | ICD-10-CM

## 2018-09-21 DIAGNOSIS — E279 Disorder of adrenal gland, unspecified: Secondary | ICD-10-CM | POA: Insufficient documentation

## 2018-09-21 LAB — CORTISOL, PLASMA OR SERUM: CORTISOL: 2.2 ug/dL

## 2018-09-24 ENCOUNTER — Telehealth (HOSPITAL_BASED_OUTPATIENT_CLINIC_OR_DEPARTMENT_OTHER): Payer: Self-pay | Admitting: "Endocrinology

## 2018-09-24 ENCOUNTER — Other Ambulatory Visit: Payer: Self-pay

## 2018-09-24 ENCOUNTER — Ambulatory Visit
Admission: RE | Admit: 2018-09-24 | Discharge: 2018-09-24 | Disposition: A | Discharge status: Home / Self Care | Payer: Commercial Managed Care - PPO | Source: Ambulatory Visit | Attending: INTERNAL MEDICINE-ENDOCRINOLOGY-DIABETES AND METABOLISM | Admitting: INTERNAL MEDICINE-ENDOCRINOLOGY-DIABETES AND METABOLISM

## 2018-09-24 DIAGNOSIS — N281 Cyst of kidney, acquired: Secondary | ICD-10-CM | POA: Insufficient documentation

## 2018-09-24 DIAGNOSIS — D3502 Benign neoplasm of left adrenal gland: Secondary | ICD-10-CM | POA: Insufficient documentation

## 2018-09-24 DIAGNOSIS — E278 Other specified disorders of adrenal gland: Secondary | ICD-10-CM

## 2018-09-24 DIAGNOSIS — Q632 Ectopic kidney: Secondary | ICD-10-CM | POA: Insufficient documentation

## 2018-09-24 DIAGNOSIS — D3500 Benign neoplasm of unspecified adrenal gland: Secondary | ICD-10-CM

## 2018-09-24 LAB — POC ISTAT CREATININE (RESULT): CREATININE, POC: 1.4 mg/dL — ABNORMAL HIGH (ref 0.62–1.27)

## 2018-09-24 MED ORDER — IOPAMIDOL 300 MG IODINE/ML (61 %) INTRAVENOUS SOLUTION
100.00 mL | INTRAVENOUS | Status: AC
Start: 2018-09-24 — End: 2018-09-24
  Administered 2018-09-24: 100 mL via INTRAVENOUS

## 2018-09-24 NOTE — Telephone Encounter (Signed)
MyChart message sent to patient by provider and myself.Loyce Dys  09/24/2018, 12:11

## 2018-09-24 NOTE — Telephone Encounter (Signed)
-----   Message from Telford Nab, MD sent at 09/21/2018 11:04 PM EDT -----  Mychart message sent:    Richard [name],    Thank you for getting your labs done. Your cortisol level is still not suppressing so it is possible that the adrenal nodule might be making a tiny amount of cortisol. The salivary cheek swab cortisol levels were good and low and I am still waiting on the urine test to come back. It usually takes about a week or so to come back. If the adrenal nodule is making some cortisol, it is not so much where it is causing full blown Cushing's disease but it could be something called subclinical Cushing's disease where the adrenal gland only periodically spits out extra cortisol but is not doing it all the time. Or it could be nothing at all and the dexamethasone test could just be the outlier if the other tests are negative. The urine test and the repeat CT scan you'll be getting soon will help [place] determine if we need to send you to surgery in the future to get that adrenal gland out or not. It is not something that should keep you from having your colon or heart valve surgeries, however. We  have ruled out the adrenaline producing tumor so you are cleared for your colon and heart surgeries from my standpoint. I will send you a message when those other results come in and if it is looking like you have subclinical Cushings, it is something that can get worse over time and make you develop high sugars and worsening high blood pressure in the future so we will talk more about if we need to send you for adrenal surgery in the future at your appointment on 7/28.     Sincerely,    Keith Rake, MD, MPH 09/21/2018, 23:03

## 2018-09-25 LAB — CORTISOL, FREE, 24 HOUR, URINE
COLLECTION DURATION: 24 h
CORTISOL, FREE, 24 HOUR, URINE: 48 ug/(24.h) — ABNORMAL HIGH (ref 3.5–45)
URINE VOLUME: 2300 mL

## 2018-09-30 LAB — DEXAMETHASONE, SERUM: DEXAMETHASONE, SERUM: 945 ng/dL

## 2018-10-01 NOTE — Progress Notes (Signed)
Department of Endocrinology  Return Virtual Video Visit     Name/MRN: Richard [name], Richard [name] Date of service: 10/02/2018   Age/DOB: 58 y.o., 26-Jan-1961 Chief Complaint: Adrenal Adenoma     VIRTUAL VISIT DOCUMENTATION:  Patient Location: [address]  [place] [address]  Patient/family aware of provider location:  yes  Patient/family consent for telemedicine:  yes  Examination observed and performed by:  Telford Nab, MD    HISTORY OF PRESENTING ILLNESS:     Richard [name] is a 58 y.o. 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    Past History  -Hospitalized for a GI bleed from diverticulosis in 12/2017. He has history of bovine aortic valve replacement and is on Eliquis for DVT/PEs. He is being evaluated for repeat aortic valve replacement as the other one is wearing out.   -On CT scan, had incidental 1.5 cm left adrenal nodule  -Has HTN. Takes amlodipine 5 mg daily and occasionally losartan 50 mg as needed (does not take regularly)  -Previous workup done with me included negative plasma free metanephrines, normal aldosterone and renin  -Cushing's workup indicated subclinical Cushing's syndrome with 2 negative late night salivary cortisol levels, mildly elevated 24-hour urine free cortisol of 48, three failed dexamethasone suppression tests (1 mg 07/20/2018 with cortisol 2.0, 1 mg 08/13/2018 with cortisol 2.1, 2 mg 09/21/2018 with cortisol 2.2 with good dexamethasone level 945)    -Hypertension/Hypotension: Some HTN  -If hypertension, which medications currently on: Losartan PRN and amlodipine 5 mg daily  -Low potassium: Yes, eats bananas and protein shake. No K supplements.  Potassium was 5.1 in 07/19/2018  -Sugars: No diabetes   -Muscle wasting: No  -Moon facies/facial plethora: No  -Abdominal striae: No  -Dorsoclavicular fat pad/buffalo hump: No  -Facial or body hirsutism: No; has male pattern balding   -Difficulty walking/standing: No. 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 Had a blood clot- following with hematology  -Palpitations: No  -Sweats/fever: Sweats when walking but none occurring spontaneously   -History of surgeries and if yes, any complications like high blood pressure, MI, stroke: No problems with past surgeries  -Steroid use (PO, topical, nasal, rectal, injection): No   -Thyroid problems: Synthroid 112 mcg qAM  -Muscle/joint pain: No; some Charley horses  -Nausea/Vomiting/Diarrhea: Occasional diarrhea. Planning for colon surgery after AVR  -Dizziness/passing out: No  -Salt craving: No  -Abdominal pain: No   -Change in skin color: No   -Family history of adrenal, pituitary, thyroid, pancreas, calcium disorders: No    On Eliquis, plan to switch to Xarelto. States they want to break blood clot up in groin first. Then AVR in 3 months. Colon surgery on hold until after AVR.      MEDICAL HISTORY:   PROBLEM LIST:  Patient Active Problem List    Diagnosis   . Aortic valve disease   . Bilateral pulmonary embolism (CMS HCC)   . DVT (deep venous thrombosis) (CMS HCC)   .  Pulmonary embolism (CMS HCC)   . Acute GI bleeding   . History of stress test   . H/O echocardiogram   . S/P AVR   . Essential hypertension   . Hyperlipidemia   . Epilepsy (CMS [place])   . Congenital anomaly of kidney   . Mass of esophagus   . Hypothyroidism        PAST MEDICAL & SURGICAL HISTORIES:   Past Medical History:   Diagnosis Date   .  Anxiety    . Arthropathy    . BPH (benign prostatic hyperplasia)    . Clostridium difficile diarrhea    . Depression    . Diverticulosis    . DVT (deep venous thrombosis) (CMS HCC)    . Epilepsy (CMS [place])     OVER 10 YEARS SINCE LAST SEIZURE-DR WIEMER   . Esophageal reflux    . GIB (gastrointestinal bleeding) 2017   . Horseshoe kidney    . Hx of aortic valve replacement    . Hyperlipidemia    . Hypertension    . Hypothyroidism    . Mass of esophagus    . Nodular prostate    . Osteoarthritis cervical spine    . Pulmonary emboli (CMS HCC)    . Testicular hypofunction    . Wears glasses         Past Surgical History:   Procedure Laterality Date   . COLONOSCOPY     . HX AORTIC VALVE REPLACEMENT  2009   . HX COLECTOMY  2016    FOR OBSTRUCTION   . HX HERNIA REPAIR     . HX TURP     . HX UPPER ENDOSCOPY  04/05/2016   . INCISIONAL HERNIA REPAIR     . VENTRAL HERNIA REPAIR              HOME MEDICATIONS:  Current Outpatient Medications   Medication Sig   . amLODIPine (NORVASC) 5 mg Oral Tablet Take 5 mg by mouth Once a day   . docusate sodium (COLACE) 100 mg Oral Capsule Take 100 mg by mouth Twice daily   . ELIQUIS 5 mg Oral Tablet TAKE 1 TABLET(5 MG) BY MOUTH TWICE DAILY   . ergocalciferol, vitamin D2, (VITAMIN D2 ORAL) Take 1 Tab by mouth Once a day   . levETIRAcetam (KEPPRA) 500 mg Oral Tablet Take 1 Tab (500 mg total) by mouth Twice daily   . levothyroxine (SYNTHROID) 112 mcg Oral Tablet Take 1 Tab (112 mcg total) by mouth Every morning   . losartan (COZAAR) 50 mg Oral Tablet Take 1 Tab (50 mg total) by mouth Once a day   . magnesium Oxide 420 mg Oral Tablet Take 420 mg by mouth Once a day   . melatonin 5 mg Oral Tablet Take 10 mg by mouth Every night    . multivitamin Oral Tablet Take 1 Tab by mouth Once a day         ALLERGIES:  Allergies   Allergen Reactions   . Statins-Hmg-Coa Reductase Inhibitors Nausea/ Vomiting        FAMILY HISTORY:  Family Medical History:     Problem Relation (Age of Onset)    Blood Clots  Paternal Grandmother    Cerebral Aneurysm Mother    Coronary Artery Disease Mother, Father    Epilepsy Sister    Goiter Mother    Heart Attack Father    High Cholesterol Mother, Father    Hypertension (High Blood Pressure) Sister, Sister  Stroke Mother      No family history of adrenal, pituitary, calcium, or pancreas tumors. No family history of thyroid cancer. Mother had a goiter.    SOCIAL HISTORY:  Social History     Tobacco Use   . Smoking status: Never Smoker   . Smokeless tobacco: Never Used   Substance Use Topics   . Alcohol use: Yes     Alcohol/week: 1.0 standard drinks     Types: 1 Glasses of wine per week     Binge frequency: Less than monthly   . Drug use: Never   Lives with wife.     REVIEW OF SYSTEMS:   Positive for occasional fatigue, occasional hypertension, difficulty gaining weight.  ROS: All systems reviewed and negative except for as above.    EXAMINATION:   Vitals: There were no vitals taken for this visit.  Full Exam Limited as Encounter is Virtual  General: Well appearing male in no acute distress.  Psychiatric: Normal mood and affect  Neuro: Alert and oriented x 3. Speech fluent. Cranial nerves grossly II-XII intact.   HEENT: Head normocephalic, atraumatic. PERRLA, EOMI, conjunctiva clear. No proptosis.  Thyroid/Neck: Trachea midline.   Lungs: Normal respiratory effort.   Extremities: No edema or erythema noted.  Skin: No visible rashes.    DATA REVIEWED:   I have reviewed previous labs, tests, imaging, and notes.    -CT chest with IV contrast 03/14/2018 from [facility]:  Severe diffuse thoracic kyphosis is noted and there are multilevel degenerative changes of the spine. There is partial anterior fusion of thoracic vertebral bodies and osteophytes. There is a chronic anterior right chest wall deformity and hardware is noted extending towards the midline. The heart is enlarged and there is a 7 cm hiatal hernia. There is a 1.5 cm left adrenal nodule. Suggest follow-up. There is no  acute thoracic aortic abnormality or pulmonary embolus. There is a 1 cm calcified granuloma in the inferior right lower lobe contacting the pleura. No infiltrate of pleural fluid is identified. Part of the anterior right upper lobe was herniated beyond the thorax adjacent to the chest wall hardware, series 5, image 38 for example.   Impression:  1. Negative for pulmonary embolus  2. Postoperative changes at the right anterior chest wall with anterior herniation of the right lung  3. Left adrenal nodule  4. Multiple additional findings as above    Last BMP  (Last result in the past 2 years)      Na   K   Cl   CO2   BUN   Cr   Calcium   Glucose   Glucose-Fasting        07/19/18 0851 139 5.1 104 27 18 1.23 9.3 90         Last Hepatic Panel  (Last result in the past 2 years)      Albumin   Total PTN   Total Bili   Direct Bili   Ast/SGOT   Alt/SGPT   Alk Phos        11/24/17 1437 2.9 5.2 0.3 0.'1 21 17 '$ 77         Component  Ref Range & Units 11/22/2017   TESTOSTERONE, FREE, NG/DL  3.87 - 14.7 ng/dL 7.79    Comment:    -------------------ADDITIONAL INFORMATION-------------------   Testing performed by [facility].   This test was developed and its performance characteristics   determined by [facility] in a manner consistent with CLIA  requirements. This test has not been cleared or approved by   the U.S. Food and Drug Administration.   TESTOSTERONE, TOTAL, SERUM  240 - 950 ng/dL 433         Ref. Range 07/10/2017 08:28 11/22/2017 09:53 07/19/2018 08:51 07/20/2018 07:57 08/06/2018 08:06 08/13/2018 08:14 09/21/2018 07:59   TSH Latest Ref Range: 0.450 - 5.330 uIU/mL 7.388 (H) 0.247 (L) 7.379 (H)       THYROXINE, FREE (FREE T4) Latest Ref Range: 0.61 - 1.12 ng/dL 0.80 1.16 (H)        ADRENOCORTICOTROPIC HORMONE Latest Ref Range: 7.0 - 69.0 pg/mL   31.8       CORTISOL Latest Units: ug/dL    2.0 15.8 2.1 2.2     Component  Ref Range & Units 07/19/2018   ALDOSTERONE, SERUM  <=21 ng/dL <4.0      Component  Ref Range & Units  07/19/2018   RENIN ACTIVITY, PLASMA  ng/mL/h <0.6     Component  Ref Range & Units 07/19/2018   NORMETANEPHRINE, FREE  <0.90 nmol/L 0.62    METANEPHRINE, FREE  <0.50 nmol/L <0.20      Component  Ref Range & Units 07/19/2018   DEHYDROEPIANDROSTERONE SULFATE, S  20 - 299 mcg/dL 42      Component  Ref Range & Units 07/19/2018   ACTH  7.0 - 69.0 pg/mL 31.8     -8 AM cortisol 08/06/2018: 15.8  -1 mg dexamethasone suppression test 07/20/2018: 8 AM cortisol 2.0  -Late night salivary cortisol 08/11/2018 and 08/12/2018: <50  -1 mg dexamethasone suppression test 08/13/2018: 2.1  -2 mg dexamethasone suppression test 09/21/2018: 2.2 with good dexamethasone level (945)  -24-hour urine creatinine 09/21/2018: 48      I have personally reviewed the images from patient's CT A/P adrenal protocol from 09/24/2018 and see an approximately 1.2 cm left adrenal nodule with low [place] units and high washout indicative of benign adenoma.    CT ABDOMEN PELVIS W/WO IV CONTRAST performed on 09/24/2018 4:04 PM.  REASON FOR EXAM:  E27.9: Adrenal nodule (CMS HCC)  TECHNIQUE: CT of the abdomen and pelvis with and without intravenous  contrast utilizing a multiphase adrenal protocol. Coronal and sagittal  reformatted images were created.  RADIATION DOSE: 1124.44 mGy.cm  CONTRAST: 100 ml's of Isovue 300  COMPARISON: CT angiogram of the abdomen and pelvis performed November 23, 2017.  FINDINGS:   LUNG BASES: A benign calcified granulomas seen within the right lower lobe.  No acute abnormality is visible within the included lung bases.  LIVER: Normal in size with uniform enhancement.  BILIARY SYSTEM/GALLBLADDER: The gallbladder is near completely contracted.  No intra or extrahepatic bile duct dilation is visible.  PANCREAS: Normal in size with uniform enhancement. No peripancreatic  inflammatory changes are present.  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 The renal parenchyma is seen at the midline and left of midline.  No hydronephrosis or perinephric fluid collection is present. There is a  mildly complex cyst at the midportion of the renal parenchyma demonstrates  thin internal septation; this cyst is minimally increased in size from 2019  and currently measures approximately 6.7 x 5.3 cm in the craniocaudal and  transverse dimensions (previously 6.1 x 5.0 cm).  ADRENALS: The right adrenal gland is within normal limits without evidence  for a right adrenal nodule or mass.  An approximately 1.2 x 1.1 cm  left adrenal nodule is present, for example  series 4 image 45. On the precontrast imaging, the Hounsfield units of this  nodule measure approximately -5. The absolute washout of this nodule is  approximately 89% and relative washout is 98%. These findings are  compatible with a benign adrenal adenoma.  SPLEEN: Within normal limits.  BOWEL: A moderate hiatal hernia is again demonstrated. No abnormally  dilated loops of bowel are present to suggest obstruction. Scattered  colonic diverticula are present without CT evidence to suggest acute  diverticulitis. The cecum and appendix are again demonstrated in the right  upper quadrant similar to the prior study.  VASCULATURE/LYMPH NODES: The abdominal aorta is normal in caliber and  contour. The portal, splenic, and superior mesenteric veins are patent. No  abnormally enlarged lymph nodes are visible in the abdomen or pelvis.  BLADDER: Mildly distended without evidence for wall thickening.  REPRODUCTIVE ORGANS: The seminal vesicles are symmetric. The prostate  appears small in size and likely corresponds to the reported prior history  of TURP.  PERITONEAL CAVITY: No acute peritoneal free air or free fluid is visible.  BONES/SOFT TISSUES: No acute or destructive bony abnormality is visible.  IMPRESSION:  1. Approximately 1.2 cm benign left adrenal adenoma, as discussed in detail  above.  2. 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  Follow-up ultrasound could be considered in 12 months.        ASSESSMENT & PLAN:     No orders of the defined types were placed in this encounter.      LUDGER BONES is a 58 y.o. 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  -1.5 cm left adrenal nodule noted on CT-PE from [facility] in 03/2018  -CT adrenal protocol done 09/24/2018 with 1.2 x 1.1 cm left adrenal nodule with HU -5 and absolute washout 89% (relative washout 98%) consistent with benign adenoma  -Workup with normal 8 AM cortisol, negative plasma free metanephrines, DHEA-S and normal aldosterone/renin  -Cushing's workup indicated subclinical Cushing's syndrome with 2 negative late night salivary cortisol levels, mildly elevated 24-hour urine free cortisol of 48, three failed dexamethasone suppression tests (1 mg 07/20/2018 with cortisol 2.0, 1 mg 08/13/2018 with cortisol 2.1, 2 mg 09/21/2018 with cortisol 2.2 with good dexamethasone level 945)  -Discussed that subclinical Cushing's syndrome is a state where the adrenal nodule is not cancerous but is just occasionally releasing extra cortisol levels. It does not require treatment at this time unless patient develops overt Cushing's or symptoms like difficult to control HTN or diabetes mellitus. Patient is planning to have aortic valve replacement and colon surgery in the near future; he does not require further workup for the nodule at this time in preparation for his surgeries. Instructed him to  let me know if he notices big purple abdominal striae, weight gain, difficult to control HTN, development of diabetes mellitus; he verbalized understanding.  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  -Patient and his wife note that this has been there for years and has been followed by serial ultrasounds in the past  -Recommend that PCP obtain follow up renal ultrasound and refer to Urology if needed. Will send this note to PCP    Follow up in 1 year    VIRTUAL VISIT DOCUMENTATION: Non-MyChart Video Visit Attestation    I attempted to conduct this video visit via MyChart, however, the patient was unable to connect via that modality.  Instead, the patient agreed to use Doximity Video Call. The patient gave verbal consent to a video visit.    15 minutes were spent face-to-face and on telephone with patient and >50% of the time was spent coordinating care and/or counseling patient on the above medical issues.    Keith Rake, MD, MPH 10/02/2018, 12:21

## 2018-10-02 ENCOUNTER — Ambulatory Visit: Payer: Commercial Managed Care - PPO | Attending: "Endocrinology | Admitting: "Endocrinology

## 2018-10-02 DIAGNOSIS — D3502 Benign neoplasm of left adrenal gland: Secondary | ICD-10-CM | POA: Insufficient documentation

## 2018-10-02 DIAGNOSIS — N281 Cyst of kidney, acquired: Secondary | ICD-10-CM

## 2018-10-02 DIAGNOSIS — E279 Disorder of adrenal gland, unspecified: Secondary | ICD-10-CM

## 2018-10-02 DIAGNOSIS — E249 Cushing's syndrome, unspecified: Secondary | ICD-10-CM

## 2018-10-02 DIAGNOSIS — E278 Other specified disorders of adrenal gland: Secondary | ICD-10-CM

## 2018-10-17 ENCOUNTER — Inpatient Hospital Stay (HOSPITAL_COMMUNITY): Admission: AD | Admit: 2018-10-17 | Payer: Self-pay | Source: Other Acute Inpatient Hospital | Admitting: NEPHROLOGY

## 2018-10-17 NOTE — Care Management Notes (Signed)
[facility] INTAKE    PATIENT INFORMATION    PCP: No primary care provider on file.  Effective Insurance:   Education officer, museum Relation Effective Group Num       Insurance Comments:     RESERVATION INFORMATION  Received call from:   Phone:   Referring Provider: Aldona Lento         Referring Provider Phone: [phone]    Transfer Source: [place]     Transfer Emergent:  Urgent  Transfer Reason: Level of Care not available at Current Facility  Transfer Comments:   Admitted on: 10/10/2018   Pt Class and Level of Care: Inpatient Step Down  Diagnosis: Lower GI Bleed      CLINICAL INFORMATION  Intubated:   COVID-19 Confirmed Positive:no  COVID-19 Pending Result (PUI) (if yes, add date test was sent to lab if known): no    PMH:  HTN, DVT, Aortic Stenosis  Dialysis: no  Cancer: no  Isolation: no  Is patient established with family practice/attending?      Patient story/clinical presentation:per Dr Candiss Norse at [facility], pt admitted on 8/5 with GI bleed. Pt's Hgb was 10 yesterday, 8.8 today. Per Dr Candiss Norse, pt has had 4 units transfused during this hospital stay. Per Dr Candiss Norse, colonoscopy shows bleeding in 2 particular spots. Vascular has seen pt at [facility] and recommended transfer for further workup and possible ablation. Dr Candiss Norse connected with Dr Rexanne Mano [facility] who accepted pt to stepdown.    qSOFA Score >= 2 = Positive                 Value                   Score    Respiratory Rate >= 22 breaths per minute = 1 point 16 0   Systolic Blood Pressure <= 100 mmHg = 1 point 107 0   Altered Mental Status: GCS <15 = 1 point none 0   Total Score  0     Serum Lactate Level >= 71mol/L (36 mg/ dL)= Positive:  Initial call level (if not available put NA) N/a   Followed up call level          Positive Screen: no  Antibiotics: no  Fluids/Pressor:  yes Octriatide  Blood Cultures:  no  Serum Lactate: no    Current vital signs:    HR:     72   BP: 107/64   Resp: 16   Temp: 36.6   Sats: 96   O2: RA     Range over last  24hrs:    HR:        BP:    Max Temp:            Per MD labs WNL unless noted below.    Labs:  WBC (3.6-11)    HGB (13.1-17.3) 8.8   Hct (39.8-50.2)    PLT (140-400)    Na (135-145)    K+ (3.5-5.1)    CL (96-111)    CO2 (23-35)    BUN (8-26)    Cr (0.62-1.27)    Glucose (60-105)    Ca (8.5-10.4)    Mag (1.6-2.5)    Phos (2.4-4.7)    BNP (<100)    D-Dimer (<233)    AST (8-41)    ALT (<55)    Alk Phos (<150)    Amylase (25-125)    Lipase (10-80)    T. Bili (0.3-1.3)    PT (8.7-13.2)  PTT (25.1-36.5)    INR (0.8-1.2)    Trop-1 (<0.03) 0.16   CK    CPK    CK-MB    ABG ph    ABG PCO2    ABG PO2    ABG HCO3    Base def/exc    LP Glucose    LP Neutrophil    LP Protein    LP WBC    LP Other            Radiology (please place images on image grid):  EKG:  IV Access:  Medications, IVF, Drips: Octriatide    Oxygen/Bipap/Vent settings:    TV:        Peep:        FiO2:        Rate:            Admitting Pt Class and Level of Care: Inpatient, Step Down,   Accepted By: Dahlia Bailiff, HOSPITALIST 57    *Send Text Page to accepting service MD. *

## 2018-10-21 ENCOUNTER — Other Ambulatory Visit (FREE_STANDING_LABORATORY_FACILITY): Payer: Self-pay | Admitting: PHYSICIAN ASSISTANT

## 2018-10-21 ENCOUNTER — Encounter (FREE_STANDING_LABORATORY_FACILITY): Admit: 2018-10-21 | Discharge: 2018-10-21 | Disposition: A | Discharge status: Home / Self Care | Payer: Self-pay

## 2018-10-22 ENCOUNTER — Telehealth (HOSPITAL_COMMUNITY): Payer: Self-pay | Admitting: Internal Medicine

## 2018-10-22 ENCOUNTER — Encounter (INDEPENDENT_AMBULATORY_CARE_PROVIDER_SITE_OTHER): Payer: Self-pay | Admitting: Internal Medicine

## 2018-10-22 LAB — COVID-19 ~~LOC~~ MOLECULAR LAB TESTING: 2019-nCoV/SARS-CoV-2: NOT DETECTED

## 2018-10-22 NOTE — Telephone Encounter (Signed)
RN spoke to wife via phone in regards to rescheduling cardiac cath. Per wife pt is currently admitted to [facility] and "having his valve replaced and already had his cath"  RN will remove case.

## 2019-05-20 ENCOUNTER — Ambulatory Visit (INDEPENDENT_AMBULATORY_CARE_PROVIDER_SITE_OTHER): Payer: Self-pay | Admitting: Ophthalmology

## 2019-05-24 ENCOUNTER — Other Ambulatory Visit: Payer: Self-pay

## 2019-05-24 ENCOUNTER — Ambulatory Visit: Payer: Commercial Managed Care - PPO | Attending: PHYSICIAN ASSISTANT

## 2019-05-24 DIAGNOSIS — E860 Dehydration: Secondary | ICD-10-CM

## 2019-05-24 DIAGNOSIS — K9419 Other complications of enterostomy: Secondary | ICD-10-CM | POA: Insufficient documentation

## 2019-05-24 LAB — CBC WITH DIFF
BASOPHIL #: <0.1 10*3/uL (ref <=0.20)
BASOPHIL %: 1 %
EOSINOPHIL #: 0.26 10*3/uL (ref <=0.50)
EOSINOPHIL %: 4 %
HCT: 50.8 % (ref 38.9–52.0)
HGB: 17 g/dL (ref 13.4–17.5)
IMMATURE GRANULOCYTE #: <0.1 10*3/uL (ref <0.10)
IMMATURE GRANULOCYTE %: 0 % (ref 0–1)
LYMPHOCYTE #: 0.68 10*3/uL — ABNORMAL LOW (ref 1.00–4.80)
LYMPHOCYTE %: 9 %
MCH: 32.8 pg — ABNORMAL HIGH (ref 26.0–32.0)
MCHC: 33.5 g/dL (ref 31.0–35.5)
MCV: 97.9 fL (ref 78.0–100.0)
MONOCYTE #: 0.85 10*3/uL (ref 0.20–1.10)
MONOCYTE %: 12 %
NEUTROPHIL #: 5.41 10*3/uL (ref 1.50–7.70)
NEUTROPHIL %: 74 %
PLATELETS: 102 10*3/uL — ABNORMAL LOW (ref 150–400)
RBC: 5.19 10*6/uL (ref 4.50–6.10)
RDW-CV: 13.5 % (ref 11.5–15.5)
WBC: 7.3 10*3/uL (ref 3.7–11.0)

## 2019-05-24 LAB — BASIC METABOLIC PANEL
ANION GAP: 11 mmol/L (ref 5–15)
BUN/CREA RATIO: 18 (ref 6–20)
BUN: 21 mg/dL (ref 8–26)
CALCIUM: 9.3 mg/dL (ref 8.9–10.3)
CHLORIDE: 103 mmol/L (ref 101–111)
CO2 TOTAL: 25 mmol/L (ref 22–32)
CREATININE: 1.18 mg/dL (ref 0.90–1.30)
ESTIMATED GFR: >60 mL/min/{1.73_m2} (ref >60)
GLUCOSE: 116 mg/dL — ABNORMAL HIGH (ref 70–110)
POTASSIUM: 4.7 mmol/L (ref 3.6–5.1)
SODIUM: 139 mmol/L (ref 136–144)

## 2019-05-27 ENCOUNTER — Encounter (INDEPENDENT_AMBULATORY_CARE_PROVIDER_SITE_OTHER): Payer: Self-pay | Admitting: Ophthalmology

## 2019-05-27 ENCOUNTER — Other Ambulatory Visit: Payer: Self-pay

## 2019-05-27 ENCOUNTER — Ambulatory Visit: Payer: Commercial Managed Care - PPO | Attending: Ophthalmology | Admitting: Ophthalmology

## 2019-05-27 DIAGNOSIS — H25813 Combined forms of age-related cataract, bilateral: Secondary | ICD-10-CM

## 2019-05-27 DIAGNOSIS — Z7982 Long term (current) use of aspirin: Secondary | ICD-10-CM | POA: Insufficient documentation

## 2019-05-27 DIAGNOSIS — H47012 Ischemic optic neuropathy, left eye: Secondary | ICD-10-CM

## 2019-05-27 NOTE — Progress Notes (Addendum)
Richard [name], Richard [name]  Richard [name] [id number]  Operated by [facility]         Patient Name: Richard [name]  MRN#: [id number]  [place]: 1960-06-01    Date of Service: 05/27/2019        [name] is a 59 y.o. male who presents today for evaluation/consultation of:  HPI     Pt here for NPV, Optic neuropathy. Referred by Dr. Hardin Negus (his PCP)   Pt states hes lost part of his va in his OS   Pt states this happened about a year ago  Pt denies FOL, floaters   Pt uses At[facility] prn OU   Pt states he has a family hx of macular degeneration   Pt denies any hx of eye sx   Pt states hes had his current glasses since January         Last edited by Rosendo Gros, [place] on 05/27/2019  2:13 PM. (History)        ROS     Positive for: Eyes    Negative for: Constitutional, Gastrointestinal, Neurological, Skin, Genitourinary, Musculoskeletal, HENT, Endocrine, Cardiovascular, Respiratory, Psychiatric, Allergic/Imm, Heme/Lymph    Last edited by Rosendo Gros, [place] on 05/27/2019  2:13 PM. (History)         All other systems Negative    Rosendo Gros, COA 05/27/2019, 14:30     Past Surgical History:   Procedure Laterality Date   . COLONOSCOPY     . HX AORTIC VALVE REPLACEMENT  2009   . HX COLECTOMY  2016    FOR OBSTRUCTION   . HX HERNIA REPAIR     . HX TURP     . HX UPPER ENDOSCOPY  04/05/2016   . INCISIONAL HERNIA REPAIR     . VENTRAL HERNIA REPAIR             Past Medical History:   Diagnosis Date   . Anxiety    . Arthropathy    . BPH (benign prostatic hyperplasia)    . Clostridium difficile diarrhea    . Depression    . Diverticulosis    . DVT (deep venous thrombosis) (CMS HCC)    . Epilepsy (CMS [place])     OVER 10 YEARS SINCE LAST SEIZURE-DR WIEMER   . Esophageal reflux    . GIB (gastrointestinal bleeding) 2017   . Horseshoe kidney    . Hx of aortic valve replacement    . Hyperlipidemia    . Hypertension    . Hypothyroidism    . Mass of esophagus    . Nodular prostate    .  Osteoarthritis cervical spine    . Pulmonary emboli (CMS HCC)    . Testicular hypofunction    . Wears glasses            Past Medical History Pertinent Negatives:   Diagnosis Date Noted   . Asthma 08/07/2017   . Atrial fibrillation (CMS HCC) 08/07/2017   . Cancer (CMS HCC) 12/14/2017   . Chronic obstructive airway disease (CMS HCC) 08/07/2017   . Congestive heart failure (CMS HCC) 08/07/2017   . COPD (chronic obstructive pulmonary disease) (CMS HCC) 12/14/2017   . Coronary artery disease 08/07/2017   . CVA (cerebrovascular accident) (CMS [place]) 08/07/2017   . Diabetes mellitus (CMS [place]) 12/14/2017   . Diabetes mellitus type 1 (CMS [place]) 08/07/2017   . Diabetes mellitus, type 2 (CMS [place]) 08/07/2017   . Heart failure (  CMS [place]) 08/07/2017   . History of infection with vancomycin resistant Enterococcus (VRE) 12/14/2017   . Hx MRSA infection 12/14/2017   . MDRO (multiple drug resistant organisms) resistance 12/14/2017   . MI (myocardial infarction) (CMS [place]) 08/07/2017   . Migraine 08/07/2017   . Peptic ulcer 08/07/2017   . Personal history of malignant neoplasm of prostate 08/07/2017   . Rash 12/14/2017   . Rheumatoid arthritis (CMS [place]) 08/07/2017   . Wears dentures 12/14/2017       Patient Active Problem List   Diagnosis   . Hyperlipidemia   . Epilepsy (CMS [place])   . Congenital anomaly of kidney   . Mass of esophagus   . Hypothyroidism   . S/P AVR   . Essential hypertension   . History of stress test   . H/O echocardiogram   . Acute GI bleeding   . DVT (deep venous thrombosis) (CMS HCC)   . Pulmonary embolism (CMS HCC)   . Bilateral pulmonary embolism (CMS HCC)   . Aortic valve disease       Family History:  Family Medical History:     Problem Relation (Age of Onset)    Blood Clots Paternal Grandmother    Cerebral Aneurysm Mother    Coronary Artery Disease Mother, Father    Epilepsy Sister    Goiter Mother    Heart Attack Father    High Cholesterol Mother, Father    Hypertension (High Blood Pressure) Sister, Sister     Stroke Mother              Social History:     Social History     Tobacco Use   . Smoking status: Never Smoker   . Smokeless tobacco: Never Used   Substance Use Topics   . Alcohol use: Yes     Alcohol/week: 1.0 standard drinks     Types: 1 Glasses of wine per week            MD Addition to HPI: lost partial vision OS 1 year ago    Assessment:    ENCOUNTER DIAGNOSES     ICD-10-CM   1. Combined form of age-related cataract, both eyes  H25.813   2. Ischemic optic neuropathy of left eye  H47.012       Ophthalmic Plan of Care:    AION OS  - ASA 81 mg already taking  - optimize BP  - optimize cholesterol    Follow up:    I have asked Exie Parody to follow up with Dr. Leonard Downing, MD  05/27/2019, 15:14      I have seen and examined the above patient. I discussed the above diagnoses listed in the assessment and the above ophthalmic plan of care with the patient and patient's family. All questions were answered. I reviewed and, when necessary, made changes to the technician/resident note, documented ophthalmology exam, chief complaint, history of present illness, allergies, review of systems, past medical, past surgical, family and social history. I personally reviewed and interpreted all testing and/or imaging performed at this visit and agree with the resident's or fellow's interpretation. Any exceptions/additions are edited/noted in the relevant encounter fields.    No orders of the defined types were placed in this encounter.      No orders of the defined types were placed in this encounter.

## 2019-06-27 ENCOUNTER — Other Ambulatory Visit: Payer: Self-pay

## 2019-06-27 ENCOUNTER — Ambulatory Visit (INDEPENDENT_AMBULATORY_CARE_PROVIDER_SITE_OTHER): Payer: Commercial Managed Care - PPO | Admitting: Rheumatology

## 2019-06-27 ENCOUNTER — Encounter (INDEPENDENT_AMBULATORY_CARE_PROVIDER_SITE_OTHER): Payer: Self-pay | Admitting: Ophthalmology

## 2019-06-27 ENCOUNTER — Ambulatory Visit (HOSPITAL_BASED_OUTPATIENT_CLINIC_OR_DEPARTMENT_OTHER): Payer: Commercial Managed Care - PPO | Admitting: Ophthalmology

## 2019-06-27 ENCOUNTER — Ambulatory Visit (HOSPITAL_BASED_OUTPATIENT_CLINIC_OR_DEPARTMENT_OTHER)
Admission: RE | Admit: 2019-06-27 | Discharge: 2019-06-27 | Disposition: A | Discharge status: Home / Self Care | Payer: Commercial Managed Care - PPO | Source: Ambulatory Visit

## 2019-06-27 ENCOUNTER — Ambulatory Visit
Admission: RE | Admit: 2019-06-27 | Discharge: 2019-06-27 | Disposition: A | Discharge status: Home / Self Care | Payer: Commercial Managed Care - PPO | Source: Ambulatory Visit | Attending: Ophthalmology | Admitting: Ophthalmology

## 2019-06-27 DIAGNOSIS — H538 Other visual disturbances: Secondary | ICD-10-CM

## 2019-06-27 DIAGNOSIS — H47012 Ischemic optic neuropathy, left eye: Secondary | ICD-10-CM

## 2019-06-27 DIAGNOSIS — H547 Unspecified visual loss: Secondary | ICD-10-CM | POA: Insufficient documentation

## 2019-06-27 LAB — CBC
HCT: 50.5 % (ref 38.9–52.0)
HGB: 17 g/dL (ref 13.4–17.5)
MCH: 32.6 pg — ABNORMAL HIGH (ref 26.0–32.0)
MCHC: 33.7 g/dL (ref 31.0–35.5)
MCV: 96.9 fL (ref 78.0–100.0)
PLATELETS: 105 10*3/uL — ABNORMAL LOW (ref 150–400)
RBC: 5.21 10*6/uL (ref 4.50–6.10)
RDW-CV: 12.8 % (ref 11.5–15.5)
WBC: 9.1 10*3/uL (ref 3.7–11.0)

## 2019-06-27 LAB — C-REACTIVE PROTEIN(CRP),INFLAMMATION: CRP INFLAMMATION: 4.5 mg/L (ref <8.0)

## 2019-06-27 LAB — BUN: BUN: 13 mg/dL (ref 8–25)

## 2019-06-27 LAB — GLUCOSE, NON FASTING: GLUCOSE: 85 mg/dL (ref 65–125)

## 2019-06-27 LAB — CREATININE WITH EGFR
CREATININE: 1.32 mg/dL (ref 0.75–1.35)
ESTIMATED GFR: 59 mL/min/BSA — ABNORMAL LOW (ref >=60)

## 2019-06-27 LAB — SEDIMENTATION RATE: ERYTHROCYTE SEDIMENTATION RATE (ESR): 9 mm/h (ref 0–15)

## 2019-06-27 NOTE — Progress Notes (Addendum)
[place], [place]  [place] [address]  Operated by [facility]         Patient Name: Richard [name]  MRN#: [id number]  [place]: 13-May-1960    Date of Service: 06/27/2019    Chief Complaint     Decreased Vision; AION          CHAP BALDIVIA is a 59 y.o. male who presents today for evaluation/consultation of:  HPI     Decreased Vision     In both eyes.  Characterized as blurry vision.              Comments     Pt was referred here today by Dr. Laurance Flatten for eval of AION OS.   Pt stated that he has a h/o epilepsy. He stated that he has noticed some fl/fl in his vision, but stated that they are related to his epilepsy.   Pt notes that he does have occasional headaches.   He stated that his vision has been decreased and blurry at times.             Last edited by Janene Madeira, [place] on 06/27/2019  1:14 PM. (History)        ROS     Positive for: Eyes (decreased vision)    Negative for: Constitutional, Gastrointestinal, Neurological, Skin, Genitourinary, Musculoskeletal, HENT, Endocrine, Cardiovascular, Respiratory, Psychiatric, Allergic/Imm, Heme/Lymph    Last edited by Janene Madeira, [place] on 06/27/2019  1:14 PM. (History)         All other systems Negative    Janene Madeira, COA 06/27/2019, 13:15     Past Surgical History:   Procedure Laterality Date    COLONOSCOPY      HX AORTIC VALVE REPLACEMENT  2009    HX COLECTOMY  2016    FOR OBSTRUCTION    HX HERNIA REPAIR      HX TURP      HX UPPER ENDOSCOPY  04/05/2016    INCISIONAL HERNIA REPAIR      VENTRAL HERNIA REPAIR             Past Medical History:   Diagnosis Date    Anxiety     Arthropathy     BPH (benign prostatic hyperplasia)     Clostridium difficile diarrhea     Depression     Diverticulosis     DVT (deep venous thrombosis) (CMS HCC)     Epilepsy (CMS HCC)     OVER 10 YEARS SINCE LAST SEIZURE-DR WIEMER    Esophageal reflux     GIB (gastrointestinal bleeding) 2017    Horseshoe kidney     Hx of  aortic valve replacement     Hyperlipidemia     Hypertension     Hypothyroidism     Mass of esophagus     Nodular prostate     Osteoarthritis cervical spine     Pulmonary emboli (CMS [facility])     Testicular hypofunction     Wears glasses            Past Medical History Pertinent Negatives:   Diagnosis Date Noted    Asthma 08/07/2017    Atrial fibrillation (CMS [place]) 08/07/2017    Cancer (CMS [place]) 12/14/2017    Chronic obstructive airway disease (CMS [place]) 08/07/2017    Congestive heart failure (CMS [place]) 08/07/2017    COPD (chronic obstructive pulmonary disease) (CMS [place]) 12/14/2017    Coronary artery disease 08/07/2017  CVA (cerebrovascular accident) (CMS [place]) 08/07/2017    Diabetes mellitus (CMS [place]) 12/14/2017    Diabetes mellitus type 1 (CMS [place]) 08/07/2017    Diabetes mellitus, type 2 (CMS HCC) 08/07/2017    Heart failure (CMS [place]) 08/07/2017    History of infection with vancomycin resistant Enterococcus (VRE) 12/14/2017    Hx MRSA infection 12/14/2017    MDRO (multiple drug resistant organisms) resistance 12/14/2017    MI (myocardial infarction) (CMS [place]) 08/07/2017    Migraine 08/07/2017    Peptic ulcer 08/07/2017    Personal history of malignant neoplasm of prostate 08/07/2017    Rash 12/14/2017    Rheumatoid arthritis (CMS [place]) 08/07/2017    Wears dentures 12/14/2017       Patient Active Problem List   Diagnosis    Hyperlipidemia    Epilepsy (CMS [place])    Congenital anomaly of kidney    Mass of esophagus    Hypothyroidism    S/P AVR    Essential hypertension    History of stress test    H/O echocardiogram    Acute GI bleeding    DVT (deep venous thrombosis) (CMS HCC)    Pulmonary embolism (CMS HCC)    Bilateral pulmonary embolism (CMS HCC)    Aortic valve disease       Family History:  Family Medical History:     Problem Relation (Age of Onset)    Blood Clots Paternal Grandmother    Cerebral Aneurysm Mother    Coronary Artery Disease Mother, Father    Epilepsy Sister       Goiter Mother    Heart Attack Father    High Cholesterol Mother, Father    Hypertension (High Blood Pressure) Sister, Sister    Stroke Mother              Social History:     Social History     Tobacco Use    Smoking status: Never Smoker    Smokeless tobacco: Never Used   Substance Use Topics    Alcohol use: Yes     Alcohol/week: 1.0 standard drinks     Types: 1 Glasses of wine per week       MD Addition to HPI: Noticed about a year ago that he is missing half his left eye vision is missing. Has HTN but no DM. Previously on Eliquis. Had blood clots in his leg. Once dissolved was taken off and now just on ASA. Has occasional headaches. No recent illness or jaw claudication. No vision black outs. Longstanding mild headaches sometimes above left eye, for over 10 years. No fever or chills or jaw claud or weight loss. No change in vision since decrease about one year ago. No diplopia. Has had tick bites. Has bovine heart valve. Used to take erectile dysfunction meds but not not now. No sleep apnea.         Assessment:      ICD-10-CM    1. Ischemic optic neuropathy of left eye  H47.012 OPH 3 ISOPTERS VF       Ophthalmic Plan of Care:  Disc pallor os with hx of vision loss os one year ago with rapd and inf alt defect, possible old NAION-risk of fellow eye involvement reviewed  -advised PCP eval for microvascular risk factors  -VF with inferior hemifield defect OS  -risk factor modification with PCP  -check cbc, wsr, crp, lyme, hgA1c, gfr  -hx bovine heart valve   -MRI head and orbits  Follow up:    I have asked Exie Parody to follow up in  46mos [place] or to call if worsening         Cannon Kettle, MD  06/27/2019, 13:59      I have seen and examined the above patient. I discussed the above diagnoses listed in the assessment and the above ophthalmic plan of care with the patient and patient's family. All questions were answered. I reviewed and, when necessary, made changes to the technician/resident note,  documented ophthalmology exam, chief complaint, history of present illness, allergies, review of systems, past medical, past surgical, family and social history. I personally reviewed and interpreted all testing and/or imaging performed at this visit and agree with the resident's or fellow's interpretation. Any exceptions/additions are edited/noted in the relevant encounter fields.    I saw and examined the patient.  I reviewed the resident's or fellow's note.  I agree with the findings and plan of care as documented in the resident's or fellow's note.  I personally reviewed and interpreted all testing and/or imaging performed at this visit and agree with the resident's or fellow's interpretation. Any exceptions/additions are edited/noted.     Gwen Her, MD 06/27/2019, 14:18

## 2019-06-28 ENCOUNTER — Encounter (INDEPENDENT_AMBULATORY_CARE_PROVIDER_SITE_OTHER): Payer: Self-pay | Admitting: Ophthalmology

## 2019-06-28 LAB — ANA (ANTINUCLEAR ANTIBODIES), SERUM
ANTI-NUCLEAR ANTIBODIES,QUALITATIVE: NEGATIVE
ANTI-NUCLEAR ANTIBODIES,QUANTITATIVE: 0.64 {index_val} (ref <=0.90)

## 2019-06-28 LAB — SYPHILIS SCREENING ALGORITHM WITH REFLEX (TITER, TP-PA), SERUM: TREPONEMAL AB QUALITATIVE: NONREACTIVE

## 2019-06-28 LAB — LYME ANTIBODY PANEL WITH REFLEX
LYME IGG: NEGATIVE
LYME IGM: NEGATIVE

## 2019-06-29 LAB — ANGIOTENSIN CONVERTING ENZYME (ACE), SERUM: ANGIOTENSIN CONVERTING ENZYME: 31 U/L (ref 16–85)

## 2019-07-01 NOTE — Letter (Signed)
PATIENT NAME: Richard [name] NUMBER:  [id number]  DATE OF SERVICE: 06/28/2019  DATE OF BIRTH:  03-29-1960      June 28, 2019     [name]   [address]   [place], [place]  [address]     Dear Mr. Picazo:     I wanted to let you know that on a blood work test performed on you at [facility] that your GFR or level of kidney function was slightly low at 59.  In addition, it is noted that your platelet count, which are cells that are responsible for blood clotting in the body, was reduced at 105,000.  This is approximately the same as that blood platelet level noted 1 month ago.      Because of his reduction in platelets, I would appreciate it if you would discuss this with your family doctor to see if further evaluation would be required.  If you do not have such a family doctor, call us at [phone] and you will reach Ms. Sherril Cong, who can schedule you an appointment with one of our fine internists.      Sincerely,        Gwen Her, MD  Associate Professor; Director, Neuro-Ophthalmology Service   [facility]               CC:   Arley Phenix, MD   Fax: [phone]     [name]   [address]   [place], [place] [address]       DD:  06/28/2019 13:50:11  DT:  06/30/2019 18:26:27 DG  D#:  [id number]

## 2019-07-04 ENCOUNTER — Encounter (INDEPENDENT_AMBULATORY_CARE_PROVIDER_SITE_OTHER): Payer: Self-pay

## 2019-07-19 ENCOUNTER — Other Ambulatory Visit: Payer: Self-pay

## 2019-07-19 ENCOUNTER — Ambulatory Visit (HOSPITAL_COMMUNITY)
Admission: RE | Admit: 2019-07-19 | Discharge: 2019-07-19 | Disposition: A | Discharge status: Home / Self Care | Payer: Commercial Managed Care - PPO | Source: Ambulatory Visit | Admitting: MRI

## 2019-07-19 ENCOUNTER — Ambulatory Visit
Admission: RE | Admit: 2019-07-19 | Discharge: 2019-07-19 | Disposition: A | Discharge status: Home / Self Care | Payer: Commercial Managed Care - PPO | Source: Ambulatory Visit | Attending: Ophthalmology | Admitting: Ophthalmology

## 2019-07-19 DIAGNOSIS — H47012 Ischemic optic neuropathy, left eye: Secondary | ICD-10-CM

## 2019-07-19 DIAGNOSIS — H538 Other visual disturbances: Secondary | ICD-10-CM

## 2019-07-19 MED ORDER — GADOBUTROL 10 MMOL/10 ML (1 MMOL/ML) INTRAVENOUS SYRINGE
INJECTION | INTRAVENOUS | Status: AC
Start: 2019-07-19 — End: 2019-07-19
  Filled 2019-07-19: qty 10

## 2019-07-19 MED ORDER — GADOBUTROL 10 MMOL/10 ML (1 MMOL/ML) INTRAVENOUS SYRINGE
10.00 mL | INJECTION | INTRAVENOUS | Status: AC
Start: 2019-07-19 — End: 2019-07-19
  Administered 2019-07-19: 4 mL via INTRAVENOUS

## 2019-07-22 ENCOUNTER — Encounter (INDEPENDENT_AMBULATORY_CARE_PROVIDER_SITE_OTHER): Payer: Self-pay | Admitting: Ophthalmology

## 2019-07-23 NOTE — Letter (Signed)
PATIENT NAME: [place] NUMBER:  [id number]  DATE OF SERVICE: 07/22/2019  DATE OF BIRTH:  12/16/1960      Jul 22, 2019       [name]   [address]   [place], [place] [address]       Dear Mr. Gosey:       I wanted to let you know that your MRI scan of the brain demonstrates some small vessel disease.  This was similar to the MRI finding of August 30, 2017.  There is no evidence of acute stroke.  Because of this finding, you should see your family doctor with this letter in hand in order to make sure that they evaluate you for ischemia or blood supply risk factors.  If you do not have such a doctor call us at [phone] and ask to speak to Ms. Sherril Cong and schedule you with one of our fine physicians at [facility].  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  There is no orbital mass or tumor seen.  Please plan on keeping your followup appointment with [place].      Sincerely,        Gwen Her, MD  Associate Professor; Director, Neuro-Ophthalmology Service   [facility]               CC:   [name]   [address]   [place], [place] [address]       DD:  07/22/2019 09:18:58  DT:  07/23/2019 07:39:17 RO  D#:  [id number]

## 2019-07-30 ENCOUNTER — Ambulatory Visit (INDEPENDENT_AMBULATORY_CARE_PROVIDER_SITE_OTHER): Payer: Self-pay | Admitting: Ophthalmology

## 2019-07-30 NOTE — Telephone Encounter (Signed)
I attempted to call the patient back. I left a message stating I will return phone call later today.   [place], COMT  07/30/2019, 07:54

## 2019-07-30 NOTE — Telephone Encounter (Signed)
-----   Message from [place] sent at 07/29/2019  4:28 PM EDT -----  Pt wife called and wanted to know if Dr. Laurance Flatten wants to remove the Pt cataracts or if he thinks they are bad enough//She can be reached at [phone]

## 2019-07-31 NOTE — Telephone Encounter (Signed)
I called and spoke with wife and explained. It doesn't seem ready to come off but even if it does the vision might not get better because of the stroke in the back of the eye. She understood.   [place], COMT  07/31/2019, 10:54

## 2019-08-29 ENCOUNTER — Ambulatory Visit: Payer: Medicare (Managed Care) | Attending: PHYSICIAN ASSISTANT

## 2019-08-29 ENCOUNTER — Other Ambulatory Visit: Payer: Self-pay

## 2019-08-29 DIAGNOSIS — E875 Hyperkalemia: Secondary | ICD-10-CM | POA: Insufficient documentation

## 2019-08-29 LAB — POTASSIUM: POTASSIUM: 4.2 mmol/L (ref 3.5–5.1)

## 2019-09-26 ENCOUNTER — Encounter (INDEPENDENT_AMBULATORY_CARE_PROVIDER_SITE_OTHER): Payer: Self-pay | Admitting: Ophthalmology

## 2019-10-02 ENCOUNTER — Ambulatory Visit (HOSPITAL_BASED_OUTPATIENT_CLINIC_OR_DEPARTMENT_OTHER): Payer: Commercial Managed Care - PPO | Admitting: "Endocrinology

## 2019-10-02 NOTE — Progress Notes (Signed)
Department of Endocrinology  Return Telephone Visit     Name/MRN: Richard [name], Richard [name] Date of service: 10/08/2019   Age/DOB: 59 y.o., 14-Apr-1960 Chief Complaint: Adrenal Adenoma     VIRTUAL VISIT DOCUMENTATION:  Patient Location: [address]  [place] [address]  Patient/family aware of provider location:  yes  Patient/family consent for telemedicine:  yes  Examination observed and performed by:  N/A (telephone visit)    HISTORY OF PRESENTING ILLNESS:     Richard [name] is a 59 y.o. 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    Past History  -Hospitalized for a GI bleed from diverticulosis in 12/2017. He has history of bovine aortic valve replacement and is on Eliquis for DVT/PEs. He is being evaluated for repeat aortic valve replacement as the other one is wearing out.   -On CT scan, had incidental 1.5 cm left adrenal nodule  -Has HTN. Takes amlodipine 5 mg daily and occasionally losartan 50 mg as needed (does not take regularly)  -Previous workup done with me included negative plasma free metanephrines, normal aldosterone and renin  -Cushing's workup indicated subclinical Cushing's syndrome with 2 negative late night salivary cortisol levels, mildly elevated 24-hour urine free cortisol of 48, three failed dexamethasone suppression tests (1 mg 07/20/2018 with cortisol 2.0, 1 mg 08/13/2018 with cortisol 2.1, 2 mg 09/21/2018 with cortisol 2.2 with good dexamethasone level 945)    Had AVR and partial colectomy. Both surgeries were done at [facility] Pathology was not cancer; had massive bleeding which improved after the surgery. Now off Eliquis and now on aspirin. Has a new PCP and reports he will be  starting testosterone injections for low libido.     -Hypertension/Hypotension: +HTN, has been stable  -If hypertension, which medications currently on: Losartan PRN and amlodipine 5 mg daily  -Low potassium: Yes prior to surgery. No K supplements. Potassium was 4.2 in 08/2019  -Sugars: No diabetes   -Muscle wasting: No  -Moon facies/facial plethora: No  -Abdominal striae: No  -Dorsoclavicular fat pad/buffalo hump: No  -Difficulty walking/standing: No. Walking about 3 miles every other day  -Easy bruising/bleeding: Not bruising easily, slight bleeding easily but not too bad  -Increased/decreased appetite: No  -Weight changes: Stable, weighs around 196 lbs now. 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, cataracts- following with [facility]  -Palpitations: No  -Sweats/fever: No  -History of surgeries and if yes, any complications like high blood pressure, MI, stroke: No problems with past surgeries  -Steroid use (PO, topical, nasal, rectal, injection): No   -Thyroid problems: Hypothyroidism, on Synthroid 88 mcg qAM  -Muscle/joint pain: No; some leg pain once in while  -Nausea/Vomiting/Diarrhea: No  -Dizziness/passing out: No  -Salt craving: No  -Abdominal pain: No   -Change in skin color: No   -Family history of adrenal, pituitary, thyroid, pancreas, calcium disorders: No       MEDICAL HISTORY:   PROBLEM LIST:  Patient Active Problem List    Diagnosis   . Aortic valve disease   . Bilateral pulmonary embolism (CMS HCC)   . DVT (deep venous thrombosis) (CMS HCC)   . Pulmonary embolism (CMS HCC)   .  Acute GI bleeding   . History of stress test   . H/O echocardiogram   . S/P AVR   . Essential hypertension   . Hyperlipidemia   . Epilepsy (CMS [place])   . Congenital anomaly of kidney   . Mass of esophagus   . Hypothyroidism        PAST MEDICAL & SURGICAL HISTORIES:   Past Medical History:   Diagnosis Date   . Anxiety    .  Arthropathy    . BPH (benign prostatic hyperplasia)    . Clostridium difficile diarrhea    . Depression    . Diverticulosis    . DVT (deep venous thrombosis) (CMS HCC)    . Epilepsy (CMS [place])     OVER 10 YEARS SINCE LAST SEIZURE-DR WIEMER   . Esophageal reflux    . GIB (gastrointestinal bleeding) 2017   . Horseshoe kidney    . Hx of aortic valve replacement    . Hyperlipidemia    . Hypertension    . Hypothyroidism    . Mass of esophagus    . Nodular prostate    . Osteoarthritis cervical spine    . Pulmonary emboli (CMS HCC)    . Testicular hypofunction    . Wears glasses         Past Surgical History:   Procedure Laterality Date   . COLONOSCOPY     . HX AORTIC VALVE REPLACEMENT  2009   . HX COLECTOMY  2016    FOR OBSTRUCTION   . HX HERNIA REPAIR     . HX TURP     . HX UPPER ENDOSCOPY  04/05/2016   . INCISIONAL HERNIA REPAIR     . VENTRAL HERNIA REPAIR              HOME MEDICATIONS:  Current Outpatient Medications   Medication Sig   . amLODIPine (NORVASC) 5 mg Oral Tablet Take 5 mg by mouth Once a day   . ascorbic acid, vitamin C, (VITAMIN C) 500 mg Oral Tablet Take 500 mg by mouth Once a day   . aspirin (ECOTRIN) 81 mg Oral Tablet, Delayed Release (E.C.) Take 81 mg by mouth Once a day   . cholecalciferol, vitamin D3, 25 mcg (1,000 unit) Oral Tablet Take 1,000 Units by mouth Once a day   . dexAMETHasone (DECADRON) 2 mg Oral Tablet Take 1 Tablet (2 mg total) by mouth One time for 1 dose At 11 PM then get 8 AM cortisol lab the following day   . levETIRAcetam (KEPPRA) 500 mg Oral Tablet Take 1 Tab (500 mg total) by mouth Twice daily   . levothyroxine (SYNTHROID) 88 mcg Oral Tablet Take 88 mcg by mouth Every morning   . losartan (COZAAR) 50 mg Oral Tablet Take 1 Tab (50 mg total) by mouth Once a day   . magnesium Oxide 420 mg Oral Tablet Take 420 mg by mouth Once a day   . multivitamin Oral Tablet Take 1 Tab by mouth Once a day   . polysaccharide iron complex (FERREX 150) 150 mg iron Oral Capsule Take 150 mg by mouth          ALLERGIES:  Allergies   Allergen Reactions   . Prednisone      Other reaction(s): Other (See Comments)  No reaction listed.   Marland Kitchen Statins-Hmg-Coa Reductase Inhibitors Nausea/ Vomiting        FAMILY HISTORY:  Family Medical History:     Problem Relation (Age of  Onset)    Blood Clots Paternal Grandmother    Cerebral Aneurysm Mother    Coronary Artery Disease Mother, Father    Epilepsy Sister    Goiter Mother    Heart Attack Father    High Cholesterol Mother, Father    Hypertension (High Blood Pressure) Sister, Sister    Stroke Mother      No family history of adrenal, pituitary, calcium, or pancreas tumors. No family history of thyroid cancer. Mother had a goiter.    SOCIAL HISTORY:  Social History     Tobacco Use   . Smoking status: Never Smoker   . Smokeless tobacco: Never Used   Substance Use Topics   . Alcohol use: Yes     Alcohol/week: 1.0 standard drinks     Types: 1 Glasses of wine per week   . Drug use: Never   Lives with wife.     REVIEW OF SYSTEMS:   Positive for weight gain then stabilization, loss of vision in left eye, some leg pain at times.   ROS: All systems reviewed and negative except for as above.    EXAMINATION:   Vitals: There were no vitals taken for this visit.  Unable to perform as encounter is virtual via telephone    DATA REVIEWED:   I have reviewed previous labs, tests, imaging, and notes.    -CT chest with IV contrast 03/14/2018 [facility]:  Severe diffuse thoracic kyphosis is noted and there are multilevel degenerative changes of the spine. There is partial anterior fusion of thoracic vertebral bodies and osteophytes. There is a chronic anterior right chest wall deformity and hardware is noted extending towards the midline. The heart is enlarged and there is a 7 cm hiatal hernia. There is a 1.5 cm left adrenal nodule. Suggest follow-up. There is no acute thoracic aortic abnormality or pulmonary embolus. There is a 1 cm calcified granuloma in the inferior right lower lobe  contacting the pleura. No infiltrate of pleural fluid is identified. Part of the anterior right upper lobe was herniated beyond the thorax adjacent to the chest wall hardware, series 5, image 38 for example.   Impression:  1. Negative for pulmonary embolus  2. Postoperative changes at the right anterior chest wall with anterior herniation of the right lung  3. Left adrenal nodule  4. Multiple additional findings as above    Last BMP  (Last result in the past 2 years)      Na   K   Cl   CO2   BUN   Cr   Calcium   Glucose   Glucose-Fasting        08/29/19 0950   4.2  Comment:  Results can be 0.1-0.3 mmol/L lower in plasma than serum, owing to lack of platelet activation in plasma specimens.                     Last Hepatic Panel  (Last result in the past 2 years)      Albumin   Total PTN   Total Bili   Direct Bili   Ast/SGOT   Alt/SGPT   Alk Phos        11/24/17 1437 2.9 5.2 0.3 0.'1 21 17 '$ 77         Component  Ref Range & Units 11/22/2017   TESTOSTERONE, FREE, NG/DL  3.87 - 14.7 ng/dL 7.79    Comment:    -------------------ADDITIONAL INFORMATION-------------------   Testing performed by [facility].   This test was developed and its performance characteristics   determined by [facility] in a manner consistent with CLIA   requirements. This test has not been cleared or approved by   the U.S. Food and Drug Administration.   TESTOSTERONE, TOTAL, SERUM  240 - 950 ng/dL 433         Ref. Range 07/10/2017 08:28 11/22/2017 09:53 07/19/2018 08:51 07/20/2018 07:57 08/06/2018 08:06 08/13/2018 08:14 09/21/2018 07:59   TSH Latest Ref Range: 0.450 - 5.330 uIU/mL 7.388 (H) 0.247 (L) 7.379 (H)       THYROXINE, FREE (FREE T4) Latest Ref Range: 0.61 - 1.12 ng/dL 0.80 1.16 (H)        ADRENOCORTICOTROPIC HORMONE Latest Ref Range: 7.0 - 69.0 pg/mL   31.8       CORTISOL Latest Units: ug/dL    2.0 15.8 2.1 2.2     Component  Ref Range & Units 07/19/2018   ALDOSTERONE, SERUM  <=21 ng/dL <4.0      Component  Ref Range & Units 07/19/2018   RENIN  ACTIVITY, PLASMA  ng/mL/h <0.6     Component  Ref Range & Units 07/19/2018   NORMETANEPHRINE, FREE  <0.90 nmol/L 0.62    METANEPHRINE, FREE  <0.50 nmol/L <0.20      Component  Ref Range & Units 07/19/2018   DEHYDROEPIANDROSTERONE SULFATE, S  20 - 299 mcg/dL 42      Component  Ref Range & Units 07/19/2018   ACTH  7.0 - 69.0 pg/mL 31.8     -8 AM cortisol 08/06/2018: 15.8  -1 mg dexamethasone suppression test 07/20/2018: 8 AM cortisol 2.0  -Late night salivary cortisol 08/11/2018 and 08/12/2018: <50  -1 mg dexamethasone suppression test 08/13/2018: 2.1  -2 mg dexamethasone suppression test 09/21/2018: 2.2 with good dexamethasone level (945)  -24-hour urine creatinine 09/21/2018: 48      CT ABDOMEN PELVIS W/WO IV CONTRAST performed on 09/24/2018 4:04 PM.  REASON FOR EXAM:  E27.9: Adrenal nodule (CMS HCC)  TECHNIQUE: CT of the abdomen and pelvis with and without intravenous  contrast utilizing a multiphase adrenal protocol. Coronal and sagittal  reformatted images were created.  RADIATION DOSE: 1124.44 mGy.cm  CONTRAST: 100 ml's of Isovue 300  COMPARISON: CT angiogram of the abdomen and pelvis performed November 23, 2017.  FINDINGS:   LUNG BASES: A benign calcified granulomas seen within the right lower lobe.  No acute abnormality is visible within the included lung bases.  LIVER: Normal in size with uniform enhancement.  BILIARY SYSTEM/GALLBLADDER: The gallbladder is near completely contracted.  No intra or extrahepatic bile duct dilation is visible.  PANCREAS: Normal in size with uniform enhancement. No peripancreatic  inflammatory changes are present.  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 The renal parenchyma is seen at the midline and left of midline.  No hydronephrosis or perinephric fluid collection is present. There is a  mildly complex cyst at the midportion of the renal parenchyma demonstrates  thin internal  septation; this cyst is minimally increased in size from 2019  and currently measures approximately 6.7 x 5.3 cm in the craniocaudal and  transverse dimensions (previously 6.1 x 5.0 cm).  ADRENALS: The right adrenal gland is within normal limits without evidence  for a right adrenal nodule or mass.  An approximately 1.2 x 1.1 cm left adrenal nodule is present, for example  series 4 image 45.  On the precontrast imaging, the Hounsfield units of this  nodule measure approximately -5. The absolute washout of this nodule is  approximately 89% and relative washout is 98%. These findings are  compatible with a benign adrenal adenoma.  SPLEEN: Within normal limits.  BOWEL: A moderate hiatal hernia is again demonstrated. No abnormally  dilated loops of bowel are present to suggest obstruction. Scattered  colonic diverticula are present without CT evidence to suggest acute  diverticulitis. The cecum and appendix are again demonstrated in the right  upper quadrant similar to the prior study.  VASCULATURE/LYMPH NODES: The abdominal aorta is normal in caliber and  contour. The portal, splenic, and superior mesenteric veins are patent. No  abnormally enlarged lymph nodes are visible in the abdomen or pelvis.  BLADDER: Mildly distended without evidence for wall thickening.  REPRODUCTIVE ORGANS: The seminal vesicles are symmetric. The prostate  appears small in size and likely corresponds to the reported prior history  of TURP.  PERITONEAL CAVITY: No acute peritoneal free air or free fluid is visible.  BONES/SOFT TISSUES: No acute or destructive bony abnormality is visible.  IMPRESSION:  1. Approximately 1.2 cm benign left adrenal adenoma, as discussed in detail  above.  2. 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  Follow-up ultrasound could be considered in 12 months.        ASSESSMENT & PLAN:     Orders Placed This Encounter   .  DEHYDROEPIANDROSTERONE SULFATE (DHEA-S), SERUM   . CORTISOL, FREE, 24 HOUR, URINE   . CREATININE URINE, TIMED   . CORTISOL, SALIVA   . CORTISOL, SALIVA   . CORTISOL, PLASMA OR SERUM   . DEXAMETHASONE, SERUM   . HGA1C (HEMOGLOBIN A1C WITH EST AVG GLUCOSE)   . METANEPHRINES, FRACTIONATED, FREE, PLASMA   . ALDOSTERONE   . RENIN ACTIVITY, PLASMA   . dexAMETHasone (DECADRON) 2 mg Oral Tablet       Richard [name] is a 59 y.o. 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 Will obtain CT scan done recently at [facility]  -Workup with normal 8 AM cortisol, negative plasma free metanephrines, DHEA-S, and normal aldosterone/renin  -Cushing's workup indicated subclinical Cushing's syndrome with 2 negative late night salivary cortisol levels, mildly elevated 24-hour urine free cortisol of 48, three failed dexamethasone suppression tests (1 mg 07/20/2018 with cortisol 2.0, 1 mg 08/13/2018 with cortisol 2.1, 2 mg 09/21/2018 with cortisol 2.2 with good dexamethasone level 945)  -Have discussed that subclinical Cushing's syndrome is a state where the adrenal nodule is not cancerous but is just occasionally releasing extra cortisol levels. It does not require treatment at this time unless patient develops overt Cushing's or symptoms like difficult to control HTN or diabetes mellitus. Patient should let me know if he notices big purple  abdominal striae, weight gain, difficult to control HTN, development of diabetes  mellitus. Will check HgA1C to evaluate for pre-diabetes or diabetes mellitus  -Will repeat 24-hour urine cortisol, 2 mg dexamethasone suppression test, and midnight salivary cortisol levels yearly for a few years then if stable, can discontinue hormone monitoring; will order these in addition to DHEA-S today  -Will repeat plasma metanephrines and aldosterone/renin    Follow up in 1 year    40 minutes of total time were spent on the day of the visit. This included records review, medication management, patient education, and documentation.     Keith Rake, MD, MPH 10/08/2019, 12:59

## 2019-10-08 ENCOUNTER — Encounter (HOSPITAL_BASED_OUTPATIENT_CLINIC_OR_DEPARTMENT_OTHER): Payer: Self-pay | Admitting: "Endocrinology

## 2019-10-08 ENCOUNTER — Ambulatory Visit: Payer: Medicare (Managed Care) | Attending: "Endocrinology | Admitting: "Endocrinology

## 2019-10-08 ENCOUNTER — Telehealth (HOSPITAL_BASED_OUTPATIENT_CLINIC_OR_DEPARTMENT_OTHER): Payer: Self-pay | Admitting: "Endocrinology

## 2019-10-08 DIAGNOSIS — E278 Other specified disorders of adrenal gland: Secondary | ICD-10-CM

## 2019-10-08 DIAGNOSIS — D3500 Benign neoplasm of unspecified adrenal gland: Secondary | ICD-10-CM | POA: Insufficient documentation

## 2019-10-08 DIAGNOSIS — E248 Other Cushing's syndrome: Secondary | ICD-10-CM | POA: Insufficient documentation

## 2019-10-08 MED ORDER — DEXAMETHASONE 2 MG TABLET
2.00 mg | ORAL_TABLET | Freq: Once | ORAL | 0 refills | Status: AC
Start: 2019-10-08 — End: 2019-10-08

## 2019-10-08 NOTE — Telephone Encounter (Signed)
Mailing lab instructions to address we have on file per provider.  Myrtha Mantis, MA  10/08/2019, 13:03

## 2019-10-09 ENCOUNTER — Encounter (INDEPENDENT_AMBULATORY_CARE_PROVIDER_SITE_OTHER): Payer: Self-pay | Admitting: Ophthalmology

## 2019-10-10 NOTE — Telephone Encounter (Signed)
Called patient to discuss concerns regarding vision and cataract surgery. Disclosed that his labwork was unremarkable at this time. Also discussed that he should keep his follow up with Dr. Lissa Merlin as requested. All questions answered for patient. Consider increasing reading MRx at next visit per patient request.    Sheral Flow, MD  10/10/2019, 11:10

## 2019-10-19 ENCOUNTER — Ambulatory Visit: Payer: Medicare (Managed Care) | Attending: "Endocrinology

## 2019-10-19 ENCOUNTER — Other Ambulatory Visit: Payer: Self-pay

## 2019-10-19 DIAGNOSIS — Z79899 Other long term (current) drug therapy: Secondary | ICD-10-CM | POA: Insufficient documentation

## 2019-10-19 DIAGNOSIS — E278 Other specified disorders of adrenal gland: Secondary | ICD-10-CM | POA: Insufficient documentation

## 2019-10-19 LAB — HGA1C (HEMOGLOBIN A1C WITH EST AVG GLUCOSE)
ESTIMATED AVERAGE GLUCOSE: 114 mg/dL
HEMOGLOBIN A1C: 5.6 % (ref 4.3–6.1)

## 2019-10-19 LAB — CREATININE URINE, TIMED
CREATININE TIMED URINE: 84 mg/dL
CREATININE/SPECIMEN: 1428 mg/d (ref 800–2100)
URINE COLLECTION DURATION: 24 h
URINE VOLUME: 1700 mL

## 2019-10-19 LAB — CORTISOL, PLASMA OR SERUM: CORTISOL: 1.7 ug/dL — ABNORMAL LOW (ref 7.0–25.0)

## 2019-10-22 ENCOUNTER — Ambulatory Visit (HOSPITAL_BASED_OUTPATIENT_CLINIC_OR_DEPARTMENT_OTHER): Payer: Self-pay | Admitting: "Endocrinology

## 2019-10-22 NOTE — Telephone Encounter (Signed)
Spoke with pt regarding below message. Pt stated when labs are finalized and provider reviews them he would like a copy sent to his PCP Dr. Synthia Innocent. Zeniah Briney, RN  10/22/2019, 12:52

## 2019-10-22 NOTE — Telephone Encounter (Signed)
Regarding: lab results  ----- Message from Joylene Igo sent at 10/22/2019 12:33 PM EDT -----  Telford Nab, MD    Patient called in wanting a copy of his labs mailed to his home address, or he would like to have a phone call.  Please call to advise.  Thank you!

## 2019-10-23 LAB — DEHYDROEPIANDROSTERONE SULFATE (DHEA-S), SERUM: DHEA-SULFATE: 25 ug/dL — ABNORMAL LOW (ref 38–313)

## 2019-10-24 LAB — METANEPHRINES, FRACTIONATED, FREE, PLASMA
METANEPHRINE, FREE: 25 pg/mL (ref <=57)
NORMETANEPHRINE, FREE: 120 pg/mL (ref <=148)
TOTAL, FREE (MN + NMN): 145 pg/mL (ref <=205)

## 2019-10-24 LAB — ALDOSTERONE: ALDOSTERONE: 8 ng/dL

## 2019-10-24 LAB — CORTISOL, SALIVA

## 2019-10-24 LAB — RENIN ACTIVITY, PLASMA: PRA,LC/MS/MS: 0.41 ng/mL/h (ref 0.25–5.82)

## 2019-10-25 LAB — CORTISOL, FREE, 24 HOUR, URINE
CORTISOL, FREE, URINE: 21.9 ug/(24.h) (ref 4.0–50.0)
CREATININE 24 HR URINE: 1.38 g/(24.h) (ref 0.50–2.15)
TOTAL VOLUME: 1700 mL

## 2019-10-29 LAB — DEXAMETHASONE, SERUM

## 2019-12-17 ENCOUNTER — Encounter (INDEPENDENT_AMBULATORY_CARE_PROVIDER_SITE_OTHER): Payer: Self-pay | Admitting: Ophthalmology

## 2020-04-16 ENCOUNTER — Ambulatory Visit (INDEPENDENT_AMBULATORY_CARE_PROVIDER_SITE_OTHER): Payer: Self-pay | Admitting: Ophthalmology

## 2020-06-25 ENCOUNTER — Ambulatory Visit (INDEPENDENT_AMBULATORY_CARE_PROVIDER_SITE_OTHER): Payer: Self-pay

## 2020-06-25 ENCOUNTER — Ambulatory Visit (INDEPENDENT_AMBULATORY_CARE_PROVIDER_SITE_OTHER): Payer: Medicare (Managed Care)

## 2020-10-08 ENCOUNTER — Telehealth (HOSPITAL_BASED_OUTPATIENT_CLINIC_OR_DEPARTMENT_OTHER): Payer: Self-pay | Admitting: "Endocrinology

## 2020-12-24 ENCOUNTER — Encounter (HOSPITAL_BASED_OUTPATIENT_CLINIC_OR_DEPARTMENT_OTHER): Payer: Self-pay | Admitting: "Endocrinology

## 2020-12-24 ENCOUNTER — Ambulatory Visit: Payer: Medicare PPO | Attending: "Endocrinology | Admitting: "Endocrinology

## 2020-12-24 DIAGNOSIS — E278 Other specified disorders of adrenal gland: Secondary | ICD-10-CM | POA: Insufficient documentation

## 2020-12-24 DIAGNOSIS — E248 Other Cushing's syndrome: Secondary | ICD-10-CM | POA: Insufficient documentation

## 2020-12-24 MED ORDER — DEXAMETHASONE 2 MG TABLET
2.0000 mg | ORAL_TABLET | Freq: Once | ORAL | 0 refills | Status: AC
Start: 2020-12-24 — End: 2020-12-24

## 2020-12-24 NOTE — Progress Notes (Signed)
Department of Endocrinology  Return Telephone Visit     Name/MRN: Richard [name], Richard [name] [id number] Date of service: 12/24/2020   Age/DOB: 60 y.o., 1960/05/14 Chief Complaint: Adrenal Adenoma     VIRTUAL VISIT DOCUMENTATION:  Patient Location: [address]  [place] [address]  Patient/family aware of provider location:  yes  Patient/family consent for telemedicine:  yes  Examination observed and performed by:  N/A (telephone visit)    HISTORY OF PRESENTING ILLNESS:     Richard [name] (Richard [name]) is a 60 y.o. 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    Past History  -Hospitalized for a GI bleed from diverticulosis in 12/2017. He has history of bovine aortic valve replacement and is on Eliquis for DVT/PEs. He is being evaluated for repeat aortic valve replacement as the other one is wearing out.   -On CT scan, had incidental 1.5 cm left adrenal nodule  -Has HTN. Takes amlodipine 5 mg daily and occasionally losartan 50 mg as needed (does not take regularly)  -Previous workup done with me included negative plasma free metanephrines, normal aldosterone and renin  -Cushing's workup indicated subclinical Cushing's syndrome with 2 negative late night salivary cortisol levels, mildly elevated 24-hour urine free cortisol of 48, three failed dexamethasone suppression tests (1 mg 07/20/2018 with cortisol 2.0, 1 mg 08/13/2018 with cortisol 2.1, 2 mg 09/21/2018 with cortisol 2.2 with good dexamethasone level 945)  -Had AVR and partial colectomy. Both surgeries were done at [facility]. Pathology was not cancer; had massive bleeding which improved after the surgery. Now off Eliquis and now on  aspirin.    -Hypertension/Hypotension: +HTN, has been stable  -If hypertension, which medications currently on: Olmesartan 40 mg daily, Amlodipine 5 mg daily  -Low potassium: Yes prior to surgery. No K supplements  -Sugars: No diabetes   Lab Results   Component Value Date    HA1C 5.6 10/19/2019     -Muscle wasting: No  -Moon facies/facial plethora: No  -Abdominal striae: No  -Dorsoclavicular fat pad/buffalo hump: No  -Difficulty walking/standing: No. Walks with dog, few miles twice a day  -Easy bruising/bleeding: Had blood clot few years ago and was on Eliquis. Not now; on aspirin  -Increased/decreased appetite: No  -Weight changes: Stable, currently 191 lbs. 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 Ischemic optic neuropathy, cataracts- no longer following with [facility]  -Palpitations: No. Recently had TTE- prelim normal. Done at [facility]   -Sweats/fever: No  -History of surgeries and if yes, any complications like high blood pressure, MI, stroke: No problems with past surgeries  -Steroid use (PO, topical, nasal, rectal, injection): No   -Thyroid problems: Hypothyroidism, on Synthroid 88 mcg qAM  -Muscle/joint pain: Knots in legs once in a while  -Nausea/Vomiting/Diarrhea: Not since colectomy with ostomy placement  -Dizziness/passing out: No  -Salt craving: No  -Abdominal pain: Occasional  -Change in skin color: No   -Family history of adrenal, pituitary, thyroid, pancreas, calcium disorders: No       MEDICAL HISTORY:   PROBLEM LIST:  Patient Active Problem List    Diagnosis   . Aortic valve disease   . Bilateral pulmonary embolism (  CMS [place])   . DVT (deep venous thrombosis) (CMS HCC)   . Pulmonary embolism (CMS HCC)   . Acute GI bleeding   . History of stress test   . H/O echocardiogram   . S/P AVR   . Essential hypertension   . Hyperlipidemia   . Epilepsy (CMS [place])   . Congenital anomaly of kidney   . Mass of esophagus   .  Hypothyroidism        PAST MEDICAL & SURGICAL HISTORIES:   Past Medical History:   Diagnosis Date   . Anxiety    . Arthropathy    . BPH (benign prostatic hyperplasia)    . Clostridium difficile diarrhea    . Depression    . Diverticulosis    . DVT (deep venous thrombosis) (CMS HCC)    . Epilepsy (CMS [place])     OVER 10 YEARS SINCE LAST SEIZURE-DR WIEMER   . Esophageal reflux    . GIB (gastrointestinal bleeding) 2017   . Horseshoe kidney    . Hx of aortic valve replacement    . Hyperlipidemia    . Hypertension    . Hypothyroidism    . Mass of esophagus    . Nodular prostate    . Osteoarthritis cervical spine    . Pulmonary emboli (CMS HCC)    . Testicular hypofunction    . Wears glasses         Past Surgical History:   Procedure Laterality Date   . COLONOSCOPY     . HX AORTIC VALVE REPLACEMENT  2009   . HX COLECTOMY  2016    FOR OBSTRUCTION   . HX HERNIA REPAIR     . HX TURP     . HX UPPER ENDOSCOPY  04/05/2016   . INCISIONAL HERNIA REPAIR     . VENTRAL HERNIA REPAIR              HOME MEDICATIONS:  Current Outpatient Medications   Medication Sig   . amLODIPine (NORVASC) 5 mg Oral Tablet Take 5 mg by mouth Once a day   . ascorbic acid, vitamin C, (VITAMIN C) 500 mg Oral Tablet Take 500 mg by mouth Once a day   . aspirin (ECOTRIN) 81 mg Oral Tablet, Delayed Release (E.C.) Take 81 mg by mouth Once a day   . cholecalciferol, vitamin D3, 25 mcg (1,000 unit) Oral Tablet Take 1,000 Units by mouth Once a day   . dexAMETHasone (DECADRON) 2 mg Oral Tablet Take 1 Tablet (2 mg total) by mouth One time for 1 dose At 11 PM then get 8 AM cortisol lab the following day   . levETIRAcetam (KEPPRA) 500 mg Oral Tablet Take 1 Tab (500 mg total) by mouth Twice daily (Patient taking differently: Take 1 Tablet (500 mg total) by mouth Every evening)   . levothyroxine (SYNTHROID) 88 mcg Oral Tablet Take 88 mcg by mouth Every morning   . olmesartan (BENICAR) 40 mg Oral Tablet Take 1 Tablet (40 mg total) by mouth Once a day          ALLERGIES:  Allergies   Allergen Reactions   . Prednisone      Other reaction(s): Other (See Comments)  No reaction listed.   Marland Kitchen Statins-Hmg-Coa Reductase Inhibitors Nausea/ Vomiting        FAMILY HISTORY:  Family Medical History:     Problem Relation (Age of Onset)    Blood Clots Paternal Grandmother    Cerebral Aneurysm Mother  Coronary Artery Disease Mother, Father    Epilepsy Sister    Goiter Mother    Heart Attack Father    High Cholesterol Mother, Father    Hypertension (High Blood Pressure) Sister, Sister    Stroke Mother      No family history of adrenal, pituitary, calcium, or pancreas tumors. No family history of thyroid cancer. Mother had a goiter.    SOCIAL HISTORY:  Social History     Tobacco Use   . Smoking status: Never   . Smokeless tobacco: Never   Substance Use Topics   . Alcohol use: Yes     Alcohol/week: 1.0 standard drink     Types: 1 Glasses of wine per week   . Drug use: Never   Lives with wife.     REVIEW OF SYSTEMS:      ROS: All systems reviewed and negative except for as above.    EXAMINATION:   Vitals: There were no vitals taken for this visit.  Unable to perform as encounter is virtual via telephone    DATA REVIEWED:   I have reviewed previous labs, tests, imaging, and notes.    -CT chest with IV contrast 03/14/2018 from [facility]:  Severe diffuse thoracic kyphosis is noted and there are multilevel degenerative changes of the spine. There is partial anterior fusion of thoracic vertebral bodies and osteophytes. There is a chronic anterior right chest wall deformity and hardware is noted extending towards the midline. The heart is enlarged and there is a 7 cm hiatal hernia. There is a 1.5 cm left adrenal nodule. Suggest follow-up. There is no acute thoracic aortic abnormality or pulmonary embolus. There is a 1 cm calcified granuloma in the inferior right lower lobe contacting the pleura. No infiltrate of pleural fluid is identified. Part of the anterior right upper lobe was  herniated beyond the thorax adjacent to the chest wall hardware, series 5, image 38 for example.   Impression:  1. Negative for pulmonary embolus  2. Postoperative changes at the right anterior chest wall with anterior herniation of the right lung  3. Left adrenal nodule  4. Multiple additional findings as above    Last BMP  (Last result in the past 2 years)      Na   K   Cl   CO2   BUN   Cr   Calcium   Glucose   Glucose-Fasting        08/29/19 0950   4.2  Comment: Results can be 0.1-0.3 mmol/L lower in plasma than serum, owing to lack of platelet activation in plasma specimens.                         Component  Ref Range & Units 11/22/2017   TESTOSTERONE, FREE, NG/DL  3.87 - 14.7 ng/dL 7.79    Comment:    -------------------ADDITIONAL INFORMATION-------------------   Testing performed by [facility].   This test was developed and its performance characteristics   determined by [facility] in a manner consistent with CLIA   requirements. This test has not been cleared or approved by   the U.S. Food and Drug Administration.   TESTOSTERONE, TOTAL, SERUM  240 - 950 ng/dL 433         Ref. Range 07/10/2017 08:28 11/22/2017 09:53 07/19/2018 08:51 07/20/2018 07:57 08/06/2018 08:06 08/13/2018 08:14 09/21/2018 07:59   TSH Latest Ref Range: 0.450 - 5.330 uIU/mL 7.388 (H) 0.247 (L) 7.379 (H)  THYROXINE, FREE (FREE T4) Latest Ref Range: 0.61 - 1.12 ng/dL 0.80 1.16 (H)        ADRENOCORTICOTROPIC HORMONE Latest Ref Range: 7.0 - 69.0 pg/mL   31.8       CORTISOL Latest Units: ug/dL    2.0 15.8 2.1 2.2     Component  Ref Range & Units 07/19/2018   ALDOSTERONE, SERUM  <=21 ng/dL <4.0      Component  Ref Range & Units 07/19/2018   RENIN ACTIVITY, PLASMA  ng/mL/h <0.6     Component  Ref Range & Units 07/19/2018   NORMETANEPHRINE, FREE  <0.90 nmol/L 0.62    METANEPHRINE, FREE  <0.50 nmol/L <0.20      Component  Ref Range & Units 07/19/2018   DEHYDROEPIANDROSTERONE SULFATE, S  20 - 299 mcg/dL 42      Component  Ref Range & Units  07/19/2018   ACTH  7.0 - 69.0 pg/mL 31.8     -8 AM cortisol 08/06/2018: 15.8  -1 mg dexamethasone suppression test 07/20/2018: 8 AM cortisol 2.0  -Late night salivary cortisol 08/11/2018 and 08/12/2018: <50  -1 mg dexamethasone suppression test 08/13/2018: 2.1  -2 mg dexamethasone suppression test 09/21/2018: 2.2 with good dexamethasone level (945)  -24-hour urine creatinine 09/21/2018: 48      CT ABDOMEN PELVIS W/WO IV CONTRAST performed on 09/24/2018 4:04 PM.  REASON FOR EXAM:  E27.9: Adrenal nodule (CMS HCC)  TECHNIQUE: CT of the abdomen and pelvis with and without intravenous  contrast utilizing a multiphase adrenal protocol. Coronal and sagittal  reformatted images were created.  RADIATION DOSE: 1124.44 mGy.cm  CONTRAST: 100 ml's of Isovue 300  COMPARISON: CT angiogram of the abdomen and pelvis performed November 23, 2017.  FINDINGS:   LUNG BASES: A benign calcified granulomas seen within the right lower lobe.  No acute abnormality is visible within the included lung bases.  LIVER: Normal in size with uniform enhancement.  BILIARY SYSTEM/GALLBLADDER: The gallbladder is near completely contracted.  No intra or extrahepatic bile duct dilation is visible.  PANCREAS: Normal in size with uniform enhancement. No peripancreatic  inflammatory changes are present.  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 The renal parenchyma is seen at the midline and left of midline.  No hydronephrosis or perinephric fluid collection is present. There is a  mildly complex cyst at the midportion of the renal parenchyma demonstrates  thin internal septation; this cyst is minimally increased in size from 2019  and currently measures approximately 6.7 x 5.3 cm in the craniocaudal and  transverse dimensions (previously 6.1 x 5.0 cm).  ADRENALS: The right adrenal gland is within normal limits without evidence  for a right adrenal nodule or  mass.  An approximately 1.2 x 1.1 cm left adrenal nodule is present, for example  series 4 image 45. On the precontrast imaging, the Hounsfield units of this  nodule measure approximately -5. The absolute washout of this nodule is  approximately 89% and relative washout is 98%. These findings are  compatible with a benign adrenal adenoma.  SPLEEN: Within normal limits.  BOWEL: A moderate hiatal hernia is again demonstrated. No abnormally  dilated loops of bowel are present to suggest obstruction. Scattered  colonic diverticula are present without CT evidence to suggest acute  diverticulitis. The cecum and appendix are again demonstrated in the right  upper quadrant similar to the prior study.  VASCULATURE/LYMPH NODES:  The abdominal aorta is normal in caliber and  contour. The portal, splenic, and superior mesenteric veins are patent. No  abnormally enlarged lymph nodes are visible in the abdomen or pelvis.  BLADDER: Mildly distended without evidence for wall thickening.  REPRODUCTIVE ORGANS: The seminal vesicles are symmetric. The prostate  appears small in size and likely corresponds to the reported prior history  of TURP.  PERITONEAL CAVITY: No acute peritoneal free air or free fluid is visible.  BONES/SOFT TISSUES: No acute or destructive bony abnormality is visible.  IMPRESSION:  1. Approximately 1.2 cm benign left adrenal adenoma, as discussed in detail  above.  2. 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  Follow-up ultrasound could be considered in 12 months.        ASSESSMENT & PLAN:     Orders Placed This Encounter   . CT ADRENAL W/WO IV CONTRAST   . CORTISOL, FREE, 24 HOUR, URINE   . CREATININE URINE, TIMED   . DEHYDROEPIANDROSTERONE SULFATE (DHEA-S), SERUM   . ADRENOCORTICOTROPIC HORMONE   . CORTISOL, SALIVA   . CORTISOL, PLASMA OR SERUM   . DEXAMETHASONE, SERUM   . CORTISOL, SALIVA   . HGA1C (HEMOGLOBIN  A1C WITH EST AVG GLUCOSE)   . METANEPHRINES, FRACTIONATED, FREE, PLASMA   . ALDOSTERONE   . RENIN ACTIVITY, PLASMA   . dexAMETHasone (DECADRON) 2 mg Oral Tablet       Richard [name] [phone]) is a 60 y.o. 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 Attempted to obtain CT scan done at [facility] in 2021 but was not sent over. Will repeat CT adrenal protocol at this time  -Workup with normal 8 AM cortisol, negative plasma free metanephrines, DHEA-S, and normal aldosterone/renin in 10/2019  -Cushing's workup indicated subclinical Cushing's syndrome with 2 negative late night salivary cortisol levels, mildly elevated 24-hour urine free cortisol of 48, three failed dexamethasone suppression tests (1 mg 07/20/2018 with cortisol 2.0, 1 mg 08/13/2018 with cortisol 2.1, 2 mg 09/21/2018 with cortisol 2.2 with good dexamethasone level 945)  -Have discussed that subclinical Cushing's syndrome is a state where the adrenal nodule is not cancerous but is just occasionally releasing extra cortisol levels. It does not require treatment unless patient develops overt Cushing's or symptoms like difficult to control HTN or diabetes mellitus. Patient should let me know if he notices big purple abdominal striae, weight gain, difficult to control HTN, development of diabetes mellitus. Will check HgA1C to evaluate for  pre-diabetes or diabetes mellitus  -Will repeat 24-hour urine cortisol, 2 mg dexamethasone suppression test (with dexamethasone level, has ostomy so if dexamethasone level low will repeat at higher doses since malabsorption of the pill can occur), and midnight salivary cortisol levels yearly for a few years then if stable, can discontinue hormone monitoring; will order these in addition to ACTH and DHEA-S  today  -Will repeat plasma metanephrines and aldosterone/renin    Follow up in 1 year or sooner if needed based on labs    31 minutes of total time were spent on the day of the visit. This included records review, medication management, patient education, and documentation.     Keith Rake, MD, MPH 12/24/2020, 12:35

## 2020-12-25 ENCOUNTER — Telehealth (HOSPITAL_BASED_OUTPATIENT_CLINIC_OR_DEPARTMENT_OTHER): Payer: Self-pay | Admitting: "Endocrinology

## 2020-12-25 NOTE — Telephone Encounter (Signed)
Hickman, [facility]  I called pt to schedule his ct. I have to get auth for it because he wants to go to [facility].   But he is requesting a copy of all labs and ct order mailed to him. He has had issues with that facility and wants to take a hard copy to them on day of appointment.   I appreciate if you can mail him all copies please   Thanks   Bev         Mailed all orders to patient.Caroline Sauger, RN  12/25/2020, 09:24

## 2020-12-29 ENCOUNTER — Ambulatory Visit (HOSPITAL_BASED_OUTPATIENT_CLINIC_OR_DEPARTMENT_OTHER): Payer: Self-pay | Admitting: "Endocrinology

## 2020-12-29 ENCOUNTER — Encounter (HOSPITAL_BASED_OUTPATIENT_CLINIC_OR_DEPARTMENT_OTHER): Payer: Self-pay | Admitting: INTERNAL MEDICINE-ENDOCRINOLOGY-DIABETES AND METABOLISM

## 2020-12-29 NOTE — Telephone Encounter (Signed)
Patient called stating he wants to speak with Dr. Lysle Rubens. He states he wants to speak with her one on one. He is wanting to know why he needs to keep doing all of this testing.     Thank you  [employer]

## 2020-12-30 NOTE — Telephone Encounter (Signed)
Bridgett Larsson, MD  Caroline Sauger, RN  Caller: Unspecified Wilburn Mylar, 2:44 PM)  Please let patient know that Dr. Lysle Rubens is out of the office for the next 2 weeks. The purpose of the testing is to prove that he has a condition or disprove that he has a condition. The tests are outlined in the guidelines we try to follow. It's important to prove that something is wrong so that patients don't have unnecessary treatments.     Bridgett Larsson, MD   Associate Professor   Endocrinology & Metabolism   [place] [facility]       Alternative provider addressed in another encounter.Caroline Sauger, RN  12/30/2020, 09:26

## 2020-12-30 NOTE — Telephone Encounter (Signed)
Message from [facility] sent at 12/30/2020 11:01 AM EDT    Dr. Lysle Rubens   Please call pt regarding his Ct scan. He has questions regarding having too much dye.   Thanks   Bev        Call History     Type Contact Phone/Fax User   12/30/2020 11:01 AM EDT Phone (Incoming) Exie Parody (Self)  Hickman, Ned Grace, RN  12/30/2020, 11:05

## 2021-01-01 NOTE — Telephone Encounter (Signed)
Summary: is MRI too soon for Dye    Dr. Lysle Rubens     Pt got a 11 orders for labs . Are all of them to be done now? Are any of them to be done for appt in year? Please advise timing so can get done as needed.     Also pt had an MRI on 11/27/20 with Dye. Is it too soon to have another one with dye. Scheduled on 01/06/21.                 Call History     Type Contact Phone/Fax User   01/01/2021 11:15 AM EDT Phone (Incoming) Briscoe Burns (Emergency Contact) [phone] Burnadette Pop     Per last office note, All labs and testing to be done at this time. Routing to provider to advise on order.     Bethanie Dicker, RN  01/01/2021, 12:25

## 2021-01-04 NOTE — Telephone Encounter (Signed)
Venancio Poisson, MD  Bethanie Dicker, RN  Caller: Unspecified (6 days ago, 2:44 PM)  Hi,     The lab orders are due to be done at this time. She wanted labs at [place] I believe. Then on a separate day, he should do the dexamethasone suppression test and have serum dexamethasone level as well as the cortisol level drawn at [place].     Sincerely,     Venancio Poisson, MD 01/01/2021, 16:23   Assistant Professor   Endocrinology & Metabolism   [place] [facility]     Bethanie Dicker, RN  01/04/2021, 07:35

## 2021-01-05 NOTE — Telephone Encounter (Signed)
Message from [facility] sent at 01/05/2021 10:24 AM EDT    Dr. Lysle Rubens   Please call pt and discuss some concerns he has with the plan of care for this patient. He said he was upset because he did not receive a call from previous messages that he left. His testing is tomorrow and he needs to speak with someone prior to the CT.   Thanks   Bev        Call History     Type Contact Phone/Fax User   01/05/2021 10:22 AM EDT Phone (Incoming) Exie Parody (Self)  Hickman, [facility] patient who stated he is concerned about receiving contrast dye for his CT scheduled for tomorrow because he just had contrast dye around the middle of October for a heart procedure at [facility]. Routing to provider to advise. Advised patient to call clinic again today if he hasn't not heard back by 3:00 p.m.Caroline Sauger, RN  01/05/2021, 10:43

## 2021-01-06 ENCOUNTER — Other Ambulatory Visit (HOSPITAL_COMMUNITY): Payer: Medicare PPO

## 2021-01-06 ENCOUNTER — Inpatient Hospital Stay
Admission: RE | Admit: 2021-01-06 | Discharge: 2021-01-06 | Disposition: A | Discharge status: Home / Self Care | Payer: Medicare PPO | Source: Ambulatory Visit | Attending: "Endocrinology | Admitting: "Endocrinology

## 2021-01-06 ENCOUNTER — Other Ambulatory Visit: Payer: Self-pay

## 2021-01-06 DIAGNOSIS — R9389 Abnormal findings on diagnostic imaging of other specified body structures: Secondary | ICD-10-CM | POA: Insufficient documentation

## 2021-01-06 DIAGNOSIS — E248 Other Cushing's syndrome: Secondary | ICD-10-CM | POA: Insufficient documentation

## 2021-01-06 DIAGNOSIS — E278 Other specified disorders of adrenal gland: Secondary | ICD-10-CM | POA: Insufficient documentation

## 2021-01-06 LAB — CREATININE
CREATININE: 1.29 mg/dL (ref 0.75–1.35)
ESTIMATED GFR: 63 mL/min/BSA (ref >=60)

## 2021-01-06 LAB — BUN: BUN: 21 mg/dL (ref 8–25)

## 2021-01-06 MED ORDER — IOPAMIDOL 300 MG IODINE/ML (61 %) INTRAVENOUS SOLUTION
100.0000 mL | INTRAVENOUS | Status: AC
Start: 2021-01-06 — End: 2021-01-06
  Administered 2021-01-06: 11:00:00 90 mL via INTRAVENOUS

## 2021-01-07 NOTE — Telephone Encounter (Signed)
Schwertfeger, Renee U, APRN,FNP-BC  You 18 hours ago (1:18 PM)     Please advise pt that she's been out for over a week and we are all working on helping with her inbasket so we apologize for any delay. I am getting this message just now.     His GFR and creatinine are good so he may proceed with CT (I think he's scheduled today) and sorry for the delay.           Patient CT was 01/06/21. Patient was advised on 11/1 that if he had not heard back by 3:00 p.m. to call office and RN would go directly to an available provider. Patient did not reach out to office again. Sent MyChart with information. Caroline Sauger, RN  01/07/2021, 07:32

## 2021-01-08 ENCOUNTER — Telehealth (HOSPITAL_BASED_OUTPATIENT_CLINIC_OR_DEPARTMENT_OTHER): Payer: Self-pay | Admitting: "Endocrinology

## 2021-01-08 NOTE — Telephone Encounter (Signed)
Printed and faxed CT results to patient's PCP. Received confirmation.    Bethanie Dicker, RN  01/08/2021, 15:09

## 2021-01-08 NOTE — Telephone Encounter (Signed)
-----   Message from Velia Meyer, APRN,FNP-BC sent at 01/06/2021  1:47 PM EDT -----  Can you please send a copy to PCP? Thanks.   Renee U Schwertfeger, APRN,FNP-BC

## 2021-01-11 ENCOUNTER — Other Ambulatory Visit: Payer: Self-pay

## 2021-01-11 ENCOUNTER — Other Ambulatory Visit: Payer: Medicare PPO | Attending: "Endocrinology

## 2021-01-11 DIAGNOSIS — E248 Other Cushing's syndrome: Secondary | ICD-10-CM | POA: Insufficient documentation

## 2021-01-11 DIAGNOSIS — E278 Other specified disorders of adrenal gland: Secondary | ICD-10-CM | POA: Insufficient documentation

## 2021-01-11 LAB — CREATININE URINE, TIMED
CREATININE TIMED URINE: 136 mg/dL
CREATININE/SPECIMEN: 1360 mg/d (ref 800–2100)
URINE COLLECTION DURATION: 24 h
URINE VOLUME: 1000 mL

## 2021-01-15 NOTE — Telephone Encounter (Signed)
Dr. Lysle Rubens   Please call pt he is confused on what testing he has to complete now. He had ct and 24 hour urine, but he does not think he has completed all testing including cortisol.   Thanks   Bev        Call History     Type Contact Phone/Fax User   01/15/2021 09:07 AM EST Phone (Incoming) Theophile, Harvie (Self) [phone] Jerilynn Mages) Hickman, [facility] patient who stated that he had the CT done and 24 hour urine. Results in Epic. Confirmed for patient that the had lab orders and two saliva cortisol orders left. Sent patient MyChart with saliva cortisol instructions. Patient stated he would have labs done next week.     Confirmed time and date of patient's appointment for next year. Patient asked that this be made an in person visit. Routing to front desk to update patient's appt.     Bethanie Dicker, RN  01/15/2021, 09:31

## 2021-01-18 NOTE — Telephone Encounter (Signed)
Santostefano, Jeanell Sparrow  You 3 days ago     BS  changed      You  [facility] 3 days ago     JT  Can you change his next appt to in person?     Bethanie Dicker, RN  01/18/2021, 09:16

## 2021-01-21 LAB — CORTISOL, FREE, 24 HOUR, URINE
24HR URINE CREATININE: 1.35 g/(24.h) (ref 0.50–2.15)
CORTISOL, FREE, 24 HOUR, URINE: 15.3 (ref 3.1–42.3)
CORTISOL, FREE, URINE: 20.7 ug/(24.h) (ref 4.0–50.0)
TOTAL VOLUME: 1000 mL

## 2021-01-26 ENCOUNTER — Telehealth (HOSPITAL_BASED_OUTPATIENT_CLINIC_OR_DEPARTMENT_OTHER): Payer: Self-pay | Admitting: "Endocrinology

## 2021-01-26 NOTE — Telephone Encounter (Signed)
Received triage call. Patient wanting to know what order to blood work. Informed patient of instructions per office note. Mailed copy to patient address on file.Caroline Sauger, RN  01/26/2021, 11:43

## 2021-02-01 ENCOUNTER — Other Ambulatory Visit (HOSPITAL_BASED_OUTPATIENT_CLINIC_OR_DEPARTMENT_OTHER): Payer: Self-pay | Admitting: "Endocrinology

## 2021-02-01 MED ORDER — DEXAMETHASONE 2 MG TABLET
ORAL_TABLET | ORAL | 0 refills | Status: DC
Start: 2021-02-01 — End: 2021-03-05

## 2021-02-01 NOTE — Telephone Encounter (Signed)
Dexamethasone ordered during patient's last visit. repending to provider.     Bethanie Dicker, RN  02/01/2021, 11:30

## 2021-02-01 NOTE — Telephone Encounter (Signed)
Regarding: medication and lab questions  ----- Message from [name] sent at 02/01/2021 10:48 AM EST -----  Telford Nab, MD    Pt called in stating that Dr. Lysle Rubens was supposed to send a script for them into their pharmacy. Pt was unsure what the prescription was though, they think it was called methadone or something similar. States he is needing this prescription in order to get his lab work done and is out, states Dr. Lysle Rubens usually calls it in right before he has his labs done. The pt did mentione that the labs involved a swab so they may be referencing the [place] cortisol test but I'm unsure and so was the pt. Please call pt to advise. Thank you!

## 2021-02-04 NOTE — Telephone Encounter (Signed)
Message from [name] sent at 02/04/2021 1:08 PM EST    Summary: rx not received by pharmacy    [name], Richard Furnace, MD     Pt called in regards to the following rx. States that the rx was not received by [facility]. Pt is requesting we try to send it again. Please call pt once we've gotten a chance to send the script. Thank you!     Medication:   dexAMETHasone (DECADRON) 2 mg Oral Tablet     Pharmacy:   [id number] [place], [place] [address]   Phone: [phone] Fax: [phone]   Hours: Not open 24 hours               [facility]email.Caroline Sauger, RN  02/04/2021, 13:15

## 2021-02-12 ENCOUNTER — Other Ambulatory Visit: Payer: Medicare PPO | Attending: "Endocrinology

## 2021-02-12 ENCOUNTER — Other Ambulatory Visit: Payer: Self-pay

## 2021-02-12 DIAGNOSIS — E248 Other Cushing's syndrome: Secondary | ICD-10-CM | POA: Insufficient documentation

## 2021-02-12 DIAGNOSIS — E278 Other specified disorders of adrenal gland: Secondary | ICD-10-CM | POA: Insufficient documentation

## 2021-02-12 LAB — CORTISOL, PLASMA OR SERUM: CORTISOL: 2.2 ug/dL — ABNORMAL LOW (ref 7.0–25.0)

## 2021-02-12 LAB — HGA1C (HEMOGLOBIN A1C WITH EST AVG GLUCOSE)
ESTIMATED AVERAGE GLUCOSE: 120 mg/dL
HEMOGLOBIN A1C: 5.8 % (ref 4.3–6.1)

## 2021-02-13 ENCOUNTER — Other Ambulatory Visit: Payer: Medicare PPO | Attending: "Endocrinology

## 2021-02-13 DIAGNOSIS — E248 Other Cushing's syndrome: Secondary | ICD-10-CM | POA: Insufficient documentation

## 2021-02-13 DIAGNOSIS — E278 Other specified disorders of adrenal gland: Secondary | ICD-10-CM | POA: Insufficient documentation

## 2021-02-15 LAB — ADRENOCORTICOTROPIC HORMONE: ACTH: <5 pg/mL — ABNORMAL LOW (ref 7.7–77.0)

## 2021-02-16 ENCOUNTER — Ambulatory Visit (HOSPITAL_BASED_OUTPATIENT_CLINIC_OR_DEPARTMENT_OTHER): Payer: Self-pay | Admitting: "Endocrinology

## 2021-02-16 DIAGNOSIS — E278 Other specified disorders of adrenal gland: Secondary | ICD-10-CM

## 2021-02-16 DIAGNOSIS — E248 Other Cushing's syndrome: Secondary | ICD-10-CM

## 2021-02-16 LAB — DEHYDROEPIANDROSTERONE SULFATE (DHEA-S), SERUM: DHEA-SULFATE: 18 ug/dL — ABNORMAL LOW (ref 24–244)

## 2021-02-16 NOTE — Telephone Encounter (Signed)
Regarding: labs  ----- Message from Burnadette Pop sent at 02/16/2021  9:14 AM EST -----  Dr. Lysle Rubens    Pt has completed all the labs and would like the results when they come in.  Explained some still processing and advised you would contact most likely next week

## 2021-02-16 NOTE — Telephone Encounter (Signed)
Patient's labs from 12/9 still pending at this time.    Bethanie Dicker, RN  02/16/2021, 09:15

## 2021-02-17 LAB — METANEPHRINES, FRACTIONATED, FREE, PLASMA
METANEPHRINE, FREE: 32 pg/mL (ref <=57)
NORMETANEPHRINE, FREE: 113 pg/mL (ref <=148)
TOTAL, FREE (MN + NMN): 145 pg/mL (ref <=205)

## 2021-02-17 LAB — RENIN ACTIVITY, PLASMA: PRA,LC/MS/MS: 1.32 ng/mL/h (ref 0.25–5.82)

## 2021-02-18 LAB — ALDOSTERONE: ALDOSTERONE: 5 ng/dL

## 2021-02-19 LAB — CORTISOL, SALIVA: CORTISOL, LC/MS, SALIVA: <0.03 ug/dL

## 2021-02-20 LAB — CORTISOL, SALIVA: CORTISOL, LC/MS, SALIVA: <0.03 ug/dL

## 2021-02-24 LAB — DEXAMETHASONE, SERUM: DEXAMETHASONE: 108 ng/dL

## 2021-03-04 NOTE — Telephone Encounter (Signed)
Summary: lab results    Barghouthi, Dorian Furnace, MD     Pt. Is asking for call about lab results. Please call pt to advise.                 Call History     Type Contact Phone/Fax User   03/04/2021 01:37 PM EST Phone (Incoming) Salvatore, Shear (Self) [phone] Jerilynn Mages) Donley Redder     Mychart sent:    Hi,     I am covering for Dr. Lysle Rubens and have reviewed your labs.     Your salivary cortisol level has come back looking perfect: nice and low in the middle of the night.    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    Dr. Lysle Rubens will discuss this in more detail at your next appointment.     Happy holidays!    Bridgett Larsson, MD  Associate Professor  Endocrinology & Metabolism  [facility]    Mr. Symanski,  I am helping cover for Dr. Lysle Rubens while she is out. I see that she has messaged you about some of the labs. The Dexamethasone lab is a little on the low end. Did you take the Dexamethasone 1 mg tablet between 11 pm and midnight the night prior to completing the labs the next morning? If so, then you may need a higher dose and repeat the labs. Please let us know and I will leave this for Dr. Lysle Rubens to review for when she returns.     Sincerely,     Renee    Renee Schwertfeger, APRN  Family Nurse Practitioner, Board Certified  Certified Diabetes Educator  [facility]    Called patient, reviewed above provider messages, patient stated he did take the Dexamethasone between 11pm and 12am. Routing to provider to advise if testing need repeated.  Bethanie Dicker, RN  03/04/2021, 14:35

## 2021-03-05 MED ORDER — DEXAMETHASONE 4 MG TABLET
4.0000 mg | ORAL_TABLET | Freq: Once | ORAL | 0 refills | Status: AC
Start: 2021-03-05 — End: 2021-03-05

## 2021-03-05 NOTE — Addendum Note (Signed)
Addended by: Julianne Handler on: 03/05/2021 10:41 AM     Modules accepted: Orders

## 2021-03-05 NOTE — Telephone Encounter (Signed)
Venancio Poisson, MD  Bethanie Dicker, RN  Caller: Unspecified (2 weeks ago)  Hi,     Please let the patient know I have placed new orders to repeat the testing. This is non emergent but whenever patient is able to repeat the testing, I would recommend doing so. I have increased the dexamethasone dose this time to make sure patient appropriately absorbs the pill. Thank you!     Sincerely,   Venancio Poisson, MD 03/05/2021, 10:42     Sent patient MyChart.    Bethanie Dicker, RN  03/05/2021, 10:49

## 2021-03-24 ENCOUNTER — Other Ambulatory Visit: Payer: Medicare PPO | Attending: Internal Medicine

## 2021-03-24 ENCOUNTER — Other Ambulatory Visit: Payer: Self-pay

## 2021-03-24 DIAGNOSIS — E278 Other specified disorders of adrenal gland: Secondary | ICD-10-CM | POA: Insufficient documentation

## 2021-03-24 DIAGNOSIS — E248 Other Cushing's syndrome: Secondary | ICD-10-CM | POA: Insufficient documentation

## 2021-03-25 LAB — CORTISOL, PLASMA OR SERUM: CORTISOL: 2.3 ug/dL — ABNORMAL LOW (ref 7.0–25.0)

## 2021-03-29 ENCOUNTER — Ambulatory Visit (HOSPITAL_BASED_OUTPATIENT_CLINIC_OR_DEPARTMENT_OTHER): Payer: Self-pay | Admitting: "Endocrinology

## 2021-03-29 NOTE — Telephone Encounter (Signed)
Regarding: pt has gotten blood work done  ----- Message from Joylene Igo sent at 03/29/2021  2:36 PM EST -----  Patient wants to know if he was to get the cheek swab done.  He is was told he didn't have to.    ----- Message from Joylene Igo sent at 03/29/2021  2:35 PM EST -----  Telford Nab, MD      Patient called in to let you know she has gotten labs done.  Patient does have a few questions to ask concerning these when results come back. Please advise. Thank you!

## 2021-03-29 NOTE — Telephone Encounter (Signed)
Dexamethasone still pending.    Bethanie Dicker, RN  03/29/2021, 14:43

## 2021-03-31 LAB — DEXAMETHASONE, SERUM: DEXAMETHASONE: >1000 ng/dL

## 2021-10-19 ENCOUNTER — Ambulatory Visit: Payer: Medicare HMO | Admitting: Family Medicine

## 2021-11-09 DIAGNOSIS — Z932 Ileostomy status: Secondary | ICD-10-CM | POA: Diagnosis not present

## 2021-11-09 DIAGNOSIS — K5732 Diverticulitis of large intestine without perforation or abscess without bleeding: Secondary | ICD-10-CM | POA: Diagnosis not present

## 2021-11-09 NOTE — Progress Notes (Deleted)
   New Patient Office Visit  Subjective    Patient ID: Brett Byrd, male    DOB: 1960/04/20  Age: 61 y.o. MRN: [id number]  CC: No chief complaint on file.   HPI LAKER THOMPSON presents to establish care ***  Outpatient Encounter Medications as of 11/10/2021  Medication Sig   amLODipine (NORVASC) 10 MG tablet Take 10 mg by mouth daily.   Cholecalciferol 5000 UNITS capsule Take 5,000 Units by mouth daily.   levETIRAcetam (KEPPRA) 500 MG tablet Patient takes 1/2 by mouth in the morning and 1 by mouth at bedtime   levothyroxine (SYNTHROID, LEVOTHROID) 100 MCG tablet Take 100 mcg by mouth daily before breakfast.   Omega-3 Fatty Acids (FISH OIL) 1000 MG CAPS Take by mouth.   sulfamethoxazole-trimethoprim (BACTRIM DS) 800-160 MG per tablet Take 1 tablet by mouth 2 (two) times daily.   tadalafil (CIALIS) 5 MG tablet Take 5 mg by mouth as needed for erectile dysfunction.   vitamin B-12 (CYANOCOBALAMIN) 1000 MCG tablet Take 1,000 mcg by mouth daily.   vitamin C (ASCORBIC ACID) 500 MG tablet Take 500 mg by mouth daily.   vitamin E 100 UNIT capsule Take by mouth daily.   zinc gluconate 50 MG tablet Take 50 mg by mouth daily.   No facility-administered encounter medications on file as of 11/10/2021.    Past Medical History:  Diagnosis Date   BPH (benign prostatic hyperplasia)    Diverticula of colon    Epilepsy (HCC)    Hyperlipidemia    Hypertension    S/P aortic valve replacement    Thyroid disease     *** The histories are not reviewed yet. Please review them in the "History" navigator section and refresh this SmartLink.  No family history on file.  Social History   Socioeconomic History   Marital status: Married    Spouse name: Not on file   Number of children: Not on file   Years of education: Not on file   Highest education level: Not on file  Occupational History   Not on file  Tobacco Use   Smoking status: Never   Smokeless tobacco: Not on file  Substance and  Sexual Activity   Alcohol use: Not on file   Drug use: Not on file   Sexual activity: Not on file  Other Topics Concern   Not on file  Social History Narrative   Not on file   Social Determinants of Health   Financial Resource Strain: Not on file  Food Insecurity: Not on file  Transportation Needs: Not on file  Physical Activity: Not on file  Stress: Not on file  Social Connections: Not on file  Intimate Partner Violence: Not on file    ROS      Objective    There were no vitals taken for this visit.  Physical Exam  {Labs (Optional):23779}    Assessment & Plan:   Problem List Items Addressed This Visit   None   No follow-ups on file.   Charlton Amor, DO

## 2021-11-10 ENCOUNTER — Ambulatory Visit: Payer: Medicare HMO | Admitting: Family Medicine

## 2021-11-17 DIAGNOSIS — K439 Ventral hernia without obstruction or gangrene: Secondary | ICD-10-CM | POA: Diagnosis not present

## 2021-11-17 DIAGNOSIS — Z939 Artificial opening status, unspecified: Secondary | ICD-10-CM | POA: Diagnosis not present

## 2021-11-17 DIAGNOSIS — Z9049 Acquired absence of other specified parts of digestive tract: Secondary | ICD-10-CM | POA: Diagnosis not present

## 2021-11-22 DIAGNOSIS — K5732 Diverticulitis of large intestine without perforation or abscess without bleeding: Secondary | ICD-10-CM | POA: Diagnosis not present

## 2021-11-22 DIAGNOSIS — Z932 Ileostomy status: Secondary | ICD-10-CM | POA: Diagnosis not present

## 2021-11-23 DIAGNOSIS — Z932 Ileostomy status: Secondary | ICD-10-CM | POA: Diagnosis not present

## 2021-11-23 DIAGNOSIS — K5732 Diverticulitis of large intestine without perforation or abscess without bleeding: Secondary | ICD-10-CM | POA: Diagnosis not present

## 2021-12-02 DIAGNOSIS — Z8639 Personal history of other endocrine, nutritional and metabolic disease: Secondary | ICD-10-CM | POA: Diagnosis not present

## 2021-12-02 DIAGNOSIS — Z79899 Other long term (current) drug therapy: Secondary | ICD-10-CM | POA: Diagnosis not present

## 2021-12-02 DIAGNOSIS — H538 Other visual disturbances: Secondary | ICD-10-CM | POA: Diagnosis not present

## 2021-12-02 DIAGNOSIS — G40909 Epilepsy, unspecified, not intractable, without status epilepticus: Secondary | ICD-10-CM | POA: Diagnosis not present

## 2021-12-02 DIAGNOSIS — H5462 Unqualified visual loss, left eye, normal vision right eye: Secondary | ICD-10-CM | POA: Diagnosis not present

## 2021-12-03 DIAGNOSIS — H524 Presbyopia: Secondary | ICD-10-CM | POA: Diagnosis not present

## 2021-12-03 DIAGNOSIS — H35033 Hypertensive retinopathy, bilateral: Secondary | ICD-10-CM | POA: Diagnosis not present

## 2021-12-22 ENCOUNTER — Ambulatory Visit (INDEPENDENT_AMBULATORY_CARE_PROVIDER_SITE_OTHER): Payer: Self-pay | Admitting: Primary Care

## 2021-12-27 ENCOUNTER — Ambulatory Visit (HOSPITAL_BASED_OUTPATIENT_CLINIC_OR_DEPARTMENT_OTHER): Payer: Medicare PPO | Admitting: "Endocrinology

## 2022-01-19 DIAGNOSIS — H1032 Unspecified acute conjunctivitis, left eye: Secondary | ICD-10-CM | POA: Diagnosis not present

## 2022-01-19 DIAGNOSIS — B356 Tinea cruris: Secondary | ICD-10-CM | POA: Diagnosis not present

## 2022-01-24 DIAGNOSIS — K2289 Other specified disease of esophagus: Secondary | ICD-10-CM | POA: Diagnosis not present

## 2022-01-24 DIAGNOSIS — Z7689 Persons encountering health services in other specified circumstances: Secondary | ICD-10-CM | POA: Diagnosis not present

## 2022-01-24 DIAGNOSIS — I10 Essential (primary) hypertension: Secondary | ICD-10-CM | POA: Diagnosis not present

## 2022-01-24 DIAGNOSIS — I2699 Other pulmonary embolism without acute cor pulmonale: Secondary | ICD-10-CM | POA: Diagnosis not present

## 2022-01-24 DIAGNOSIS — F320 Major depressive disorder, single episode, mild: Secondary | ICD-10-CM | POA: Diagnosis not present

## 2022-01-24 DIAGNOSIS — E249 Cushing's syndrome, unspecified: Secondary | ICD-10-CM | POA: Diagnosis not present

## 2022-01-24 DIAGNOSIS — I82409 Acute embolism and thrombosis of unspecified deep veins of unspecified lower extremity: Secondary | ICD-10-CM | POA: Diagnosis not present

## 2022-01-24 DIAGNOSIS — Z7951 Long term (current) use of inhaled steroids: Secondary | ICD-10-CM | POA: Diagnosis not present

## 2022-01-24 DIAGNOSIS — K2100 Gastro-esophageal reflux disease with esophagitis, without bleeding: Secondary | ICD-10-CM | POA: Diagnosis not present

## 2022-01-24 DIAGNOSIS — G40909 Epilepsy, unspecified, not intractable, without status epilepticus: Secondary | ICD-10-CM | POA: Diagnosis not present

## 2022-01-24 DIAGNOSIS — Z952 Presence of prosthetic heart valve: Secondary | ICD-10-CM | POA: Diagnosis not present

## 2022-02-10 DIAGNOSIS — K5732 Diverticulitis of large intestine without perforation or abscess without bleeding: Secondary | ICD-10-CM | POA: Diagnosis not present

## 2022-02-10 DIAGNOSIS — Z932 Ileostomy status: Secondary | ICD-10-CM | POA: Diagnosis not present

## 2022-02-22 DIAGNOSIS — N5201 Erectile dysfunction due to arterial insufficiency: Secondary | ICD-10-CM | POA: Diagnosis not present

## 2022-02-22 DIAGNOSIS — R079 Chest pain, unspecified: Secondary | ICD-10-CM | POA: Diagnosis not present

## 2022-02-22 DIAGNOSIS — M8000XA Age-related osteoporosis with current pathological fracture, unspecified site, initial encounter for fracture: Secondary | ICD-10-CM | POA: Diagnosis not present

## 2022-02-22 DIAGNOSIS — N289 Disorder of kidney and ureter, unspecified: Secondary | ICD-10-CM | POA: Diagnosis not present

## 2022-02-23 DIAGNOSIS — R079 Chest pain, unspecified: Secondary | ICD-10-CM | POA: Diagnosis not present

## 2022-04-14 DIAGNOSIS — K5732 Diverticulitis of large intestine without perforation or abscess without bleeding: Secondary | ICD-10-CM | POA: Diagnosis not present

## 2022-04-14 DIAGNOSIS — Z932 Ileostomy status: Secondary | ICD-10-CM | POA: Diagnosis not present

## 2022-04-18 DIAGNOSIS — E291 Testicular hypofunction: Secondary | ICD-10-CM | POA: Diagnosis not present

## 2022-05-04 DIAGNOSIS — I2699 Other pulmonary embolism without acute cor pulmonale: Secondary | ICD-10-CM | POA: Diagnosis not present

## 2022-05-04 DIAGNOSIS — I82409 Acute embolism and thrombosis of unspecified deep veins of unspecified lower extremity: Secondary | ICD-10-CM | POA: Diagnosis not present

## 2022-05-04 DIAGNOSIS — I10 Essential (primary) hypertension: Secondary | ICD-10-CM | POA: Diagnosis not present

## 2022-05-04 DIAGNOSIS — Z952 Presence of prosthetic heart valve: Secondary | ICD-10-CM | POA: Diagnosis not present

## 2022-05-04 DIAGNOSIS — G40909 Epilepsy, unspecified, not intractable, without status epilepticus: Secondary | ICD-10-CM | POA: Diagnosis not present

## 2022-05-24 DIAGNOSIS — Z953 Presence of xenogenic heart valve: Secondary | ICD-10-CM | POA: Diagnosis not present

## 2022-05-24 DIAGNOSIS — Z952 Presence of prosthetic heart valve: Secondary | ICD-10-CM | POA: Diagnosis not present

## 2022-05-24 DIAGNOSIS — I10 Essential (primary) hypertension: Secondary | ICD-10-CM | POA: Diagnosis not present

## 2022-05-24 DIAGNOSIS — G40909 Epilepsy, unspecified, not intractable, without status epilepticus: Secondary | ICD-10-CM | POA: Diagnosis not present

## 2022-06-08 DIAGNOSIS — I059 Rheumatic mitral valve disease, unspecified: Secondary | ICD-10-CM | POA: Diagnosis not present

## 2022-06-08 DIAGNOSIS — I517 Cardiomegaly: Secondary | ICD-10-CM | POA: Diagnosis not present

## 2022-06-08 DIAGNOSIS — Z952 Presence of prosthetic heart valve: Secondary | ICD-10-CM | POA: Diagnosis not present

## 2022-06-08 DIAGNOSIS — I3489 Other nonrheumatic mitral valve disorders: Secondary | ICD-10-CM | POA: Diagnosis not present

## 2022-06-08 DIAGNOSIS — I10 Essential (primary) hypertension: Secondary | ICD-10-CM | POA: Diagnosis not present

## 2022-06-08 DIAGNOSIS — Z953 Presence of xenogenic heart valve: Secondary | ICD-10-CM | POA: Diagnosis not present

## 2022-06-09 DIAGNOSIS — K5732 Diverticulitis of large intestine without perforation or abscess without bleeding: Secondary | ICD-10-CM | POA: Diagnosis not present

## 2022-06-09 DIAGNOSIS — Z932 Ileostomy status: Secondary | ICD-10-CM | POA: Diagnosis not present

## 2022-06-20 DIAGNOSIS — N521 Erectile dysfunction due to diseases classified elsewhere: Secondary | ICD-10-CM | POA: Diagnosis not present

## 2022-06-20 DIAGNOSIS — E291 Testicular hypofunction: Secondary | ICD-10-CM | POA: Diagnosis not present

## 2022-11-28 ENCOUNTER — Ambulatory Visit: Payer: Self-pay | Admitting: Family Medicine

## 2022-11-28 ENCOUNTER — Encounter: Payer: Medicare HMO | Admitting: Family Medicine

## 2022-11-28 NOTE — Progress Notes (Deleted)
New Patient Office Visit   Subjective   Patient ID: Brett Byrd, male    DOB: 1960-03-11  Age: 62 y.o. MRN: [id number]  CC: No chief complaint on file.   HPI Brett Byrd presents to establish care. He  has a past medical history of BPH (benign prostatic hyperplasia), Diverticula of colon, Epilepsy (HCC), Hyperlipidemia, Hypertension, S/P aortic valve replacement, and Thyroid disease.  HPI    Outpatient Encounter Medications as of 11/28/2022  Medication Sig   amLODipine (NORVASC) 10 MG tablet Take 10 mg by mouth daily.   Cholecalciferol 5000 UNITS capsule Take 5,000 Units by mouth daily.   levETIRAcetam (KEPPRA) 500 MG tablet Patient takes 1/2 by mouth in the morning and 1 by mouth at bedtime   levothyroxine (SYNTHROID, LEVOTHROID) 100 MCG tablet Take 100 mcg by mouth daily before breakfast.   Omega-3 Fatty Acids (FISH OIL) 1000 MG CAPS Take by mouth.   sulfamethoxazole-trimethoprim (BACTRIM DS) 800-160 MG per tablet Take 1 tablet by mouth 2 (two) times daily.   tadalafil (CIALIS) 5 MG tablet Take 5 mg by mouth as needed for erectile dysfunction.   vitamin B-12 (CYANOCOBALAMIN) 1000 MCG tablet Take 1,000 mcg by mouth daily.   vitamin C (ASCORBIC ACID) 500 MG tablet Take 500 mg by mouth daily.   vitamin E 100 UNIT capsule Take by mouth daily.   zinc gluconate 50 MG tablet Take 50 mg by mouth daily.   No facility-administered encounter medications on file as of 11/28/2022.    *** The histories are not reviewed yet. Please review them in the "History" navigator section and refresh this SmartLink.  ROS    Objective    There were no vitals taken for this visit.  Physical Exam    Assessment & Plan:  There are no diagnoses linked to this encounter.  No follow-ups on file.   Cruzita Lederer Newman Nip, FNP

## 2022-11-28 NOTE — Patient Instructions (Signed)

## 2022-11-28 NOTE — Progress Notes (Signed)
No show appointment  

## 2022-12-01 ENCOUNTER — Encounter: Payer: Self-pay | Admitting: Family Medicine

## 2022-12-30 ENCOUNTER — Other Ambulatory Visit: Payer: Self-pay | Admitting: Family Medicine

## 2023-01-04 NOTE — Progress Notes (Unsigned)
New Patient Office Visit   Subjective   Patient ID: Brett Byrd, male    DOB: 06-08-1960  Age: 62 y.o. MRN: [id number]  CC: No chief complaint on file.   HPI Brett Byrd 62 year old male, presents to establish care. He  has a past medical history of BPH (benign prostatic hyperplasia), Diverticula of colon, Epilepsy (HCC), Hyperlipidemia, Hypertension, S/P aortic valve replacement, and Thyroid disease.  HPI    Outpatient Encounter Medications as of 01/05/2023  Medication Sig   amLODipine (NORVASC) 10 MG tablet Take 10 mg by mouth daily.   Cholecalciferol 5000 UNITS capsule Take 5,000 Units by mouth daily.   levETIRAcetam (KEPPRA) 500 MG tablet Patient takes 1/2 by mouth in the morning and 1 by mouth at bedtime   levothyroxine (SYNTHROID, LEVOTHROID) 100 MCG tablet Take 100 mcg by mouth daily before breakfast.   Omega-3 Fatty Acids (FISH OIL) 1000 MG CAPS Take by mouth.   sulfamethoxazole-trimethoprim (BACTRIM DS) 800-160 MG per tablet Take 1 tablet by mouth 2 (two) times daily.   tadalafil (CIALIS) 5 MG tablet Take 5 mg by mouth as needed for erectile dysfunction.   vitamin B-12 (CYANOCOBALAMIN) 1000 MCG tablet Take 1,000 mcg by mouth daily.   vitamin C (ASCORBIC ACID) 500 MG tablet Take 500 mg by mouth daily.   vitamin E 100 UNIT capsule Take by mouth daily.   zinc gluconate 50 MG tablet Take 50 mg by mouth daily.   No facility-administered encounter medications on file as of 01/05/2023.    *** The histories are not reviewed yet. Please review them in the "History" navigator section and refresh this SmartLink.  ROS    Objective    There were no vitals taken for this visit.  Physical Exam    Assessment & Plan:  There are no diagnoses linked to this encounter.  No follow-ups on file.   Cruzita Lederer Newman Nip, FNP

## 2023-01-05 ENCOUNTER — Encounter: Payer: Self-pay | Admitting: Family Medicine

## 2023-01-05 ENCOUNTER — Ambulatory Visit (INDEPENDENT_AMBULATORY_CARE_PROVIDER_SITE_OTHER): Payer: Medicare HMO | Admitting: Family Medicine

## 2023-01-05 VITALS — BP 137/100 | HR 87 | Ht 68.5 in | Wt 184.1 lb

## 2023-01-05 DIAGNOSIS — R7301 Impaired fasting glucose: Secondary | ICD-10-CM

## 2023-01-05 DIAGNOSIS — E038 Other specified hypothyroidism: Secondary | ICD-10-CM

## 2023-01-05 DIAGNOSIS — I10 Essential (primary) hypertension: Secondary | ICD-10-CM

## 2023-01-05 DIAGNOSIS — Z1159 Encounter for screening for other viral diseases: Secondary | ICD-10-CM

## 2023-01-05 DIAGNOSIS — E559 Vitamin D deficiency, unspecified: Secondary | ICD-10-CM | POA: Diagnosis not present

## 2023-01-05 DIAGNOSIS — Z114 Encounter for screening for human immunodeficiency virus [HIV]: Secondary | ICD-10-CM

## 2023-01-05 DIAGNOSIS — D509 Iron deficiency anemia, unspecified: Secondary | ICD-10-CM

## 2023-01-05 DIAGNOSIS — Z1211 Encounter for screening for malignant neoplasm of colon: Secondary | ICD-10-CM

## 2023-01-05 DIAGNOSIS — E538 Deficiency of other specified B group vitamins: Secondary | ICD-10-CM

## 2023-01-05 MED ORDER — CETAPHIL MOISTURIZING EX LOTN: 1.0000 | TOPICAL_LOTION | CUTANEOUS | 3 refills | Status: AC | PRN

## 2023-01-05 MED ORDER — AMLODIPINE BESYLATE 5 MG PO TABS
5.0000 mg | ORAL_TABLET | Freq: Every day | ORAL | 2 refills | Status: DC
Start: 2023-01-05 — End: 2023-05-26

## 2023-01-05 NOTE — Assessment & Plan Note (Signed)
Vitals:   01/05/23 0940 01/05/23 1011  BP: (!) 166/103 (!) 137/100  Patient reports taking Olmesartan 40 mg once daily Started Amlodipine 5 mg  once daily Follow up in 2 weeks via mychart with at home blood pressure readings  Labs ordered. Discussed with  patient to monitor their blood pressure regularly and maintain a heart-healthy diet rich in fruits, vegetables, whole grains, and low-fat dairy, while reducing sodium intake to less than 2,300 mg per day. Regular physical activity, such as 30 minutes of moderate exercise most days of the week, will help lower blood pressure and improve overall cardiovascular health. Avoiding smoking, limiting alcohol consumption, and managing stress. Take  prescribed medication, & take it as directed and avoid skipping doses. Seek emergency care if your blood pressure is (over 180/100) or you experience chest pain, shortness of breath, or sudden vision changes.Patient verbalizes understanding regarding plan of care and all questions answered.

## 2023-01-05 NOTE — Patient Instructions (Addendum)
        Great to see you today.  I have refilled the medication(s) we provide.   Cetaphil Moisturizer Cream for dry skin 2 times daily   Please Follow up in 2 weeks via [facility] with blood pressure readings    If labs were collected, we will inform you of lab results once received either by echart message or telephone call.   - echart message- for normal results that have been seen by the patient already.   - telephone call: abnormal results or if patient has not viewed results in their echart.   - Please take medications as prescribed. - Follow up with your primary health provider if any health concerns arises. - If symptoms worsen please contact your primary care provider and/or visit the emergency department.

## 2023-01-12 LAB — CBC WITH DIFFERENTIAL/PLATELET
Basophils Absolute: 0.1 10*3/uL (ref 0.0–0.2)
Basos: 1 %
EOS (ABSOLUTE): 0.2 10*3/uL (ref 0.0–0.4)
Eos: 5 %
Hematocrit: 50.7 % (ref 37.5–51.0)
Hemoglobin: 16.7 g/dL (ref 13.0–17.7)
Immature Grans (Abs): 0 10*3/uL (ref 0.0–0.1)
Immature Granulocytes: 0 %
Lymphocytes Absolute: 0.5 10*3/uL — ABNORMAL LOW (ref 0.7–3.1)
Lymphs: 10 %
MCH: 31.9 pg (ref 26.6–33.0)
MCHC: 32.9 g/dL (ref 31.5–35.7)
MCV: 97 fL (ref 79–97)
Monocytes Absolute: 0.6 10*3/uL (ref 0.1–0.9)
Monocytes: 9 %
Neutrophils Absolute: 4.3 10*3/uL (ref 1.4–7.0)
Neutrophils: 75 %
RBC: 5.23 x10E6/uL (ref 4.14–5.80)
RDW: 12.6 % (ref 11.6–15.4)
WBC: 5.7 10*3/uL (ref 3.4–10.8)

## 2023-01-12 LAB — CMP14+EGFR
ALT: 26 [IU]/L (ref 0–44)
AST: 33 [IU]/L (ref 0–40)
Albumin: 4.5 g/dL (ref 3.9–4.9)
Alkaline Phosphatase: 127 [IU]/L — ABNORMAL HIGH (ref 44–121)
BUN/Creatinine Ratio: 10 (ref 10–24)
BUN: 12 mg/dL (ref 8–27)
Bilirubin Total: 0.6 mg/dL (ref 0.0–1.2)
CO2: 23 mmol/L (ref 20–29)
Calcium: 9.5 mg/dL (ref 8.6–10.2)
Chloride: 103 mmol/L (ref 96–106)
Creatinine, Ser: 1.2 mg/dL (ref 0.76–1.27)
Globulin, Total: 3 g/dL (ref 1.5–4.5)
Glucose: 88 mg/dL (ref 70–99)
Potassium: 4.5 mmol/L (ref 3.5–5.2)
Sodium: 140 mmol/L (ref 134–144)
Total Protein: 7.5 g/dL (ref 6.0–8.5)
eGFR: 68 mL/min/{1.73_m2} (ref >59)

## 2023-01-12 LAB — LIPID PANEL
Chol/HDL Ratio: 3.3 ratio (ref 0.0–5.0)
Cholesterol, Total: 165 mg/dL (ref 100–199)
HDL: 50 mg/dL (ref >39)
LDL Chol Calc (NIH): 88 mg/dL (ref 0–99)
Triglycerides: 158 mg/dL — ABNORMAL HIGH (ref 0–149)
VLDL Cholesterol Cal: 27 mg/dL (ref 5–40)

## 2023-01-12 LAB — HEMOGLOBIN A1C
Est. average glucose Bld gHb Est-mCnc: 117 mg/dL
Hgb A1c MFr Bld: 5.7 % — ABNORMAL HIGH (ref 4.8–5.6)

## 2023-01-12 LAB — HIV ANTIBODY (ROUTINE TESTING W REFLEX): HIV Screen 4th Generation wRfx: NONREACTIVE

## 2023-01-12 LAB — IRON,TIBC AND FERRITIN PANEL
Ferritin: 91 ng/mL (ref 30–400)
Iron Saturation: 33 % (ref 15–55)
Iron: 100 ug/dL (ref 38–169)
Total Iron Binding Capacity: 305 ug/dL (ref 250–450)
UIBC: 205 ug/dL (ref 111–343)

## 2023-01-12 LAB — HEPATITIS C ANTIBODY: Hep C Virus Ab: NONREACTIVE

## 2023-01-12 LAB — VITAMIN D 25 HYDROXY (VIT D DEFICIENCY, FRACTURES): Vit D, 25-Hydroxy: 42.1 ng/mL (ref 30.0–100.0)

## 2023-01-12 LAB — TSH+FREE T4
Free T4: 1.16 ng/dL (ref 0.82–1.77)
TSH: 1.33 u[IU]/mL (ref 0.450–4.500)

## 2023-01-12 LAB — VITAMIN B12: Vitamin B-12: 385 pg/mL (ref 232–1245)

## 2023-01-13 ENCOUNTER — Telehealth: Payer: Self-pay

## 2023-01-13 NOTE — Telephone Encounter (Signed)
Copied from CRM [id number]. Topic: General - Other >> Jan 12, 2023  1:01 PM Danika B wrote: Reason for CRM: Patient wants to know if Dr created an order for patient; received something in mail that was confusing

## 2023-01-13 NOTE — Telephone Encounter (Signed)
Thank you :)

## 2023-02-09 ENCOUNTER — Other Ambulatory Visit: Payer: Self-pay

## 2023-02-09 DIAGNOSIS — I159 Secondary hypertension, unspecified: Secondary | ICD-10-CM

## 2023-02-14 LAB — MICROALBUMIN / CREATININE URINE RATIO
Creatinine, Urine: 124.4 mg/dL
Microalb/Creat Ratio: 125 mg/g{creat} — ABNORMAL HIGH (ref 0–29)
Microalbumin, Urine: 155.5 ug/mL

## 2023-03-07 ENCOUNTER — Other Ambulatory Visit: Payer: Self-pay | Admitting: Family Medicine

## 2023-03-13 DIAGNOSIS — R072 Precordial pain: Secondary | ICD-10-CM | POA: Diagnosis not present

## 2023-03-13 DIAGNOSIS — Z952 Presence of prosthetic heart valve: Secondary | ICD-10-CM | POA: Diagnosis not present

## 2023-03-13 DIAGNOSIS — E063 Autoimmune thyroiditis: Secondary | ICD-10-CM | POA: Diagnosis not present

## 2023-03-13 DIAGNOSIS — Z953 Presence of xenogenic heart valve: Secondary | ICD-10-CM | POA: Diagnosis not present

## 2023-03-13 DIAGNOSIS — Z8673 Personal history of transient ischemic attack (TIA), and cerebral infarction without residual deficits: Secondary | ICD-10-CM | POA: Diagnosis not present

## 2023-03-14 ENCOUNTER — Other Ambulatory Visit: Payer: Self-pay | Admitting: Family Medicine

## 2023-03-21 DIAGNOSIS — K921 Melena: Secondary | ICD-10-CM | POA: Diagnosis not present

## 2023-03-21 DIAGNOSIS — K6389 Other specified diseases of intestine: Secondary | ICD-10-CM | POA: Diagnosis not present

## 2023-03-21 DIAGNOSIS — K6289 Other specified diseases of anus and rectum: Secondary | ICD-10-CM | POA: Diagnosis not present

## 2023-03-23 ENCOUNTER — Telehealth: Payer: Self-pay | Admitting: Family Medicine

## 2023-03-23 NOTE — Telephone Encounter (Signed)
I spoke with the patient to schedule AWV.  He explained to me that he is no longer a patient with [facility] and that he is  now a patient with [facility]   Thank you,  Judeth Cornfield,  AMB Clinical Support [facility] AWV Program Direct Dial ??[phone]

## 2023-03-31 DIAGNOSIS — Z932 Ileostomy status: Secondary | ICD-10-CM | POA: Diagnosis not present

## 2023-03-31 DIAGNOSIS — K5732 Diverticulitis of large intestine without perforation or abscess without bleeding: Secondary | ICD-10-CM | POA: Diagnosis not present

## 2023-04-12 DIAGNOSIS — G4733 Obstructive sleep apnea (adult) (pediatric): Secondary | ICD-10-CM | POA: Diagnosis not present

## 2023-04-12 DIAGNOSIS — I2510 Atherosclerotic heart disease of native coronary artery without angina pectoris: Secondary | ICD-10-CM | POA: Diagnosis not present

## 2023-04-12 DIAGNOSIS — E7841 Elevated Lipoprotein(a): Secondary | ICD-10-CM | POA: Diagnosis not present

## 2023-04-12 DIAGNOSIS — H47012 Ischemic optic neuropathy, left eye: Secondary | ICD-10-CM | POA: Diagnosis not present

## 2023-04-13 DIAGNOSIS — R001 Bradycardia, unspecified: Secondary | ICD-10-CM | POA: Diagnosis not present

## 2023-04-13 DIAGNOSIS — R079 Chest pain, unspecified: Secondary | ICD-10-CM | POA: Diagnosis not present

## 2023-04-25 DIAGNOSIS — G4733 Obstructive sleep apnea (adult) (pediatric): Secondary | ICD-10-CM | POA: Diagnosis not present

## 2023-04-26 DIAGNOSIS — G4733 Obstructive sleep apnea (adult) (pediatric): Secondary | ICD-10-CM | POA: Diagnosis not present

## 2023-05-04 NOTE — Patient Instructions (Signed)

## 2023-05-04 NOTE — Progress Notes (Unsigned)
   Established Patient Office Visit   Subjective  Patient ID: Brett Byrd, male    DOB: [birth year]  Age: 63 y.o. MRN: [id number]  No chief complaint on file.   He  has a past medical history of BPH (benign prostatic hyperplasia), Diverticula of colon, Epilepsy (HCC), Hyperlipidemia, Hypertension, S/P aortic valve replacement, and Thyroid disease.  HPI  ROS    Objective:     There were no vitals taken for this visit. {Vitals History (Optional):23777}  Physical Exam   No results found for any visits on 05/05/23.  The ASCVD Risk score (Arnett DK, et al., 2019) failed to calculate for the following reasons:   Risk score cannot be calculated because patient has a medical history suggesting prior/existing ASCVD    Assessment & Plan:  There are no diagnoses linked to this encounter.  No follow-ups on file.   Cruzita Lederer Newman Nip, FNP

## 2023-05-05 ENCOUNTER — Encounter: Payer: Medicare HMO | Admitting: Family Medicine

## 2023-05-05 DIAGNOSIS — E038 Other specified hypothyroidism: Secondary | ICD-10-CM

## 2023-05-05 DIAGNOSIS — I10 Essential (primary) hypertension: Secondary | ICD-10-CM

## 2023-05-05 NOTE — Progress Notes (Signed)
 NO SHOW

## 2023-05-09 DIAGNOSIS — K439 Ventral hernia without obstruction or gangrene: Secondary | ICD-10-CM | POA: Diagnosis not present

## 2023-05-09 DIAGNOSIS — K5289 Other specified noninfective gastroenteritis and colitis: Secondary | ICD-10-CM | POA: Diagnosis not present

## 2023-05-10 DIAGNOSIS — Z932 Ileostomy status: Secondary | ICD-10-CM | POA: Diagnosis not present

## 2023-05-10 DIAGNOSIS — K5732 Diverticulitis of large intestine without perforation or abscess without bleeding: Secondary | ICD-10-CM | POA: Diagnosis not present

## 2023-05-17 ENCOUNTER — Encounter: Payer: Self-pay | Admitting: Family Medicine

## 2023-05-18 DIAGNOSIS — J309 Allergic rhinitis, unspecified: Secondary | ICD-10-CM | POA: Diagnosis not present

## 2023-05-26 ENCOUNTER — Other Ambulatory Visit: Payer: Self-pay | Admitting: Family Medicine

## 2023-05-30 DIAGNOSIS — K439 Ventral hernia without obstruction or gangrene: Secondary | ICD-10-CM | POA: Diagnosis not present

## 2023-06-06 DIAGNOSIS — H469 Unspecified optic neuritis: Secondary | ICD-10-CM | POA: Diagnosis not present

## 2023-06-06 DIAGNOSIS — H47012 Ischemic optic neuropathy, left eye: Secondary | ICD-10-CM | POA: Diagnosis not present

## 2023-07-10 DIAGNOSIS — W57XXXD Bitten or stung by nonvenomous insect and other nonvenomous arthropods, subsequent encounter: Secondary | ICD-10-CM | POA: Diagnosis not present

## 2023-07-10 DIAGNOSIS — G4733 Obstructive sleep apnea (adult) (pediatric): Secondary | ICD-10-CM | POA: Diagnosis not present

## 2023-07-10 DIAGNOSIS — I10 Essential (primary) hypertension: Secondary | ICD-10-CM | POA: Diagnosis not present

## 2023-07-10 DIAGNOSIS — Z953 Presence of xenogenic heart valve: Secondary | ICD-10-CM | POA: Diagnosis not present

## 2023-07-10 DIAGNOSIS — S0086XD Insect bite (nonvenomous) of other part of head, subsequent encounter: Secondary | ICD-10-CM | POA: Diagnosis not present

## 2023-07-10 DIAGNOSIS — R002 Palpitations: Secondary | ICD-10-CM | POA: Diagnosis not present

## 2023-07-10 DIAGNOSIS — K4000 Bilateral inguinal hernia, with obstruction, without gangrene, not specified as recurrent: Secondary | ICD-10-CM | POA: Diagnosis not present

## 2023-07-10 DIAGNOSIS — E538 Deficiency of other specified B group vitamins: Secondary | ICD-10-CM | POA: Diagnosis not present

## 2023-07-10 DIAGNOSIS — E782 Mixed hyperlipidemia: Secondary | ICD-10-CM | POA: Diagnosis not present

## 2023-07-10 DIAGNOSIS — E559 Vitamin D deficiency, unspecified: Secondary | ICD-10-CM | POA: Diagnosis not present

## 2023-07-11 DIAGNOSIS — Z932 Ileostomy status: Secondary | ICD-10-CM | POA: Diagnosis not present

## 2023-07-11 DIAGNOSIS — K5732 Diverticulitis of large intestine without perforation or abscess without bleeding: Secondary | ICD-10-CM | POA: Diagnosis not present

## 2023-08-03 DIAGNOSIS — R002 Palpitations: Secondary | ICD-10-CM | POA: Diagnosis not present

## 2023-10-07 ENCOUNTER — Other Ambulatory Visit: Payer: Self-pay | Admitting: Family Medicine

## 2023-10-10 DIAGNOSIS — Z952 Presence of prosthetic heart valve: Secondary | ICD-10-CM | POA: Diagnosis not present

## 2023-10-10 DIAGNOSIS — Z0000 Encounter for general adult medical examination without abnormal findings: Secondary | ICD-10-CM | POA: Diagnosis not present

## 2023-10-10 DIAGNOSIS — E7800 Pure hypercholesterolemia, unspecified: Secondary | ICD-10-CM | POA: Diagnosis not present

## 2023-10-10 DIAGNOSIS — I10 Essential (primary) hypertension: Secondary | ICD-10-CM | POA: Diagnosis not present

## 2023-10-10 DIAGNOSIS — Z953 Presence of xenogenic heart valve: Secondary | ICD-10-CM | POA: Diagnosis not present

## 2023-10-13 DIAGNOSIS — I10 Essential (primary) hypertension: Secondary | ICD-10-CM | POA: Diagnosis not present

## 2023-10-13 DIAGNOSIS — Z932 Ileostomy status: Secondary | ICD-10-CM | POA: Diagnosis not present

## 2023-10-13 DIAGNOSIS — K5732 Diverticulitis of large intestine without perforation or abscess without bleeding: Secondary | ICD-10-CM | POA: Diagnosis not present

## 2023-10-13 DIAGNOSIS — R001 Bradycardia, unspecified: Secondary | ICD-10-CM | POA: Diagnosis not present

## 2023-12-05 DIAGNOSIS — R531 Weakness: Secondary | ICD-10-CM | POA: Diagnosis not present

## 2023-12-05 DIAGNOSIS — E063 Autoimmune thyroiditis: Secondary | ICD-10-CM | POA: Diagnosis not present

## 2023-12-05 DIAGNOSIS — I10 Essential (primary) hypertension: Secondary | ICD-10-CM | POA: Diagnosis not present

## 2024-01-11 ENCOUNTER — Ambulatory Visit: Payer: Self-pay

## 2024-01-11 NOTE — Telephone Encounter (Signed)
 Copied from CRM #[id number]. Topic: General - Other >> Jan 11, 2024  2:00 PM Brett Byrd wrote: Reason for CRM: pt said he dont us  calling him anymore about appts he is no longer established with us 

## 2024-03-06 ENCOUNTER — Inpatient Hospital Stay (HOSPITAL_COMMUNITY)
Admission: RE | Admit: 2024-03-06 | Payer: Commercial Managed Care - PPO | Source: Ambulatory Visit | Admitting: Internal Medicine
